# Patient Record
Sex: Male | Born: 1940 | Race: White | Hispanic: No | Marital: Married | Smoking: Never smoker
Health system: Southern US, Community
[De-identification: ages and names within clinical notes are randomized; demographics above are authoritative.]

## PROBLEM LIST (undated history)

## (undated) DIAGNOSIS — R3129 Other microscopic hematuria: Secondary | ICD-10-CM

## (undated) DIAGNOSIS — Z85828 Personal history of other malignant neoplasm of skin: Secondary | ICD-10-CM

## (undated) DIAGNOSIS — N3281 Overactive bladder: Secondary | ICD-10-CM

## (undated) DIAGNOSIS — M47816 Spondylosis without myelopathy or radiculopathy, lumbar region: Secondary | ICD-10-CM

## (undated) DIAGNOSIS — K269 Duodenal ulcer, unspecified as acute or chronic, without hemorrhage or perforation: Secondary | ICD-10-CM

## (undated) DIAGNOSIS — Z860101 Personal history of adenomatous and serrated colon polyps: Secondary | ICD-10-CM

## (undated) DIAGNOSIS — M1990 Unspecified osteoarthritis, unspecified site: Secondary | ICD-10-CM

## (undated) DIAGNOSIS — Z86010 Personal history of colonic polyps: Secondary | ICD-10-CM

## (undated) DIAGNOSIS — K760 Fatty (change of) liver, not elsewhere classified: Secondary | ICD-10-CM

## (undated) DIAGNOSIS — M19072 Primary osteoarthritis, left ankle and foot: Secondary | ICD-10-CM

## (undated) DIAGNOSIS — IMO0001 Reserved for inherently not codable concepts without codable children: Secondary | ICD-10-CM

## (undated) DIAGNOSIS — N183 Chronic kidney disease, stage 3 unspecified: Secondary | ICD-10-CM

## (undated) DIAGNOSIS — I10 Essential (primary) hypertension: Secondary | ICD-10-CM

## (undated) DIAGNOSIS — R600 Localized edema: Secondary | ICD-10-CM

## (undated) DIAGNOSIS — N189 Chronic kidney disease, unspecified: Secondary | ICD-10-CM

## (undated) DIAGNOSIS — E669 Obesity, unspecified: Secondary | ICD-10-CM

## (undated) DIAGNOSIS — R159 Full incontinence of feces: Secondary | ICD-10-CM

## (undated) DIAGNOSIS — H9190 Unspecified hearing loss, unspecified ear: Secondary | ICD-10-CM

## (undated) DIAGNOSIS — G8929 Other chronic pain: Secondary | ICD-10-CM

## (undated) DIAGNOSIS — K209 Esophagitis, unspecified without bleeding: Secondary | ICD-10-CM

## (undated) DIAGNOSIS — K922 Gastrointestinal hemorrhage, unspecified: Secondary | ICD-10-CM

## (undated) DIAGNOSIS — E785 Hyperlipidemia, unspecified: Secondary | ICD-10-CM

## (undated) DIAGNOSIS — R7303 Prediabetes: Secondary | ICD-10-CM

## (undated) DIAGNOSIS — K648 Other hemorrhoids: Secondary | ICD-10-CM

## (undated) DIAGNOSIS — K5790 Diverticulosis of intestine, part unspecified, without perforation or abscess without bleeding: Secondary | ICD-10-CM

## (undated) DIAGNOSIS — J301 Allergic rhinitis due to pollen: Secondary | ICD-10-CM

## (undated) HISTORY — DX: Gastrointestinal hemorrhage, unspecified: K92.2

## (undated) HISTORY — DX: Full incontinence of feces: R15.9

## (undated) HISTORY — DX: Other hemorrhoids: K64.8

## (undated) HISTORY — DX: Personal history of adenomatous and serrated colon polyps: Z86.0101

## (undated) HISTORY — DX: Other chronic pain: G89.29

## (undated) HISTORY — DX: Other microscopic hematuria: R31.29

## (undated) HISTORY — DX: Obesity, unspecified: E66.9

## (undated) HISTORY — DX: Duodenal ulcer, unspecified as acute or chronic, without hemorrhage or perforation: K26.9

## (undated) HISTORY — PX: TONSILLECTOMY: SUR1361

## (undated) HISTORY — DX: Overactive bladder: N32.81

## (undated) HISTORY — DX: Fatty (change of) liver, not elsewhere classified: K76.0

## (undated) HISTORY — DX: Chronic kidney disease, unspecified: N18.9

## (undated) HISTORY — PX: ELBOW SURGERY: SHX618

## (undated) HISTORY — DX: Prediabetes: R73.03

## (undated) HISTORY — PX: WISDOM TOOTH EXTRACTION: SHX21

## (undated) HISTORY — DX: Personal history of other malignant neoplasm of skin: Z85.828

## (undated) HISTORY — DX: Essential (primary) hypertension: I10

## (undated) HISTORY — DX: Diverticulosis of intestine, part unspecified, without perforation or abscess without bleeding: K57.90

## (undated) HISTORY — DX: Allergic rhinitis due to pollen: J30.1

## (undated) HISTORY — PX: COLONOSCOPY: SHX174

## (undated) HISTORY — DX: Spondylosis without myelopathy or radiculopathy, lumbar region: M47.816

## (undated) HISTORY — DX: Unspecified hearing loss, unspecified ear: H91.90

## (undated) HISTORY — DX: Chronic kidney disease, stage 3 unspecified: N18.30

## (undated) HISTORY — DX: Hyperlipidemia, unspecified: E78.5

## (undated) HISTORY — PX: POLYPECTOMY: SHX149

## (undated) HISTORY — DX: Localized edema: R60.0

## (undated) HISTORY — DX: Personal history of colonic polyps: Z86.010

## (undated) HISTORY — DX: Reserved for inherently not codable concepts without codable children: IMO0001

## (undated) HISTORY — PX: COLONOSCOPY W/ POLYPECTOMY: SHX1380

## (undated) HISTORY — DX: Unspecified osteoarthritis, unspecified site: M19.90

## (undated) HISTORY — DX: Esophagitis, unspecified without bleeding: K20.90

---

## 2009-07-10 HISTORY — PX: UMBILICAL HERNIA REPAIR: SHX196

## 2009-07-26 ENCOUNTER — Encounter: Admission: RE | Admit: 2009-07-26 | Discharge: 2009-07-26 | Payer: Self-pay | Admitting: General Surgery

## 2009-10-19 DIAGNOSIS — IMO0001 Reserved for inherently not codable concepts without codable children: Secondary | ICD-10-CM | POA: Insufficient documentation

## 2010-08-31 DIAGNOSIS — IMO0002 Reserved for concepts with insufficient information to code with codable children: Secondary | ICD-10-CM | POA: Insufficient documentation

## 2010-08-31 DIAGNOSIS — M1710 Unilateral primary osteoarthritis, unspecified knee: Secondary | ICD-10-CM | POA: Insufficient documentation

## 2010-08-31 DIAGNOSIS — B351 Tinea unguium: Secondary | ICD-10-CM | POA: Insufficient documentation

## 2010-08-31 DIAGNOSIS — E785 Hyperlipidemia, unspecified: Secondary | ICD-10-CM | POA: Insufficient documentation

## 2011-07-11 DIAGNOSIS — K5790 Diverticulosis of intestine, part unspecified, without perforation or abscess without bleeding: Secondary | ICD-10-CM

## 2011-07-11 HISTORY — DX: Diverticulosis of intestine, part unspecified, without perforation or abscess without bleeding: K57.90

## 2011-07-12 DIAGNOSIS — Z1211 Encounter for screening for malignant neoplasm of colon: Secondary | ICD-10-CM | POA: Diagnosis not present

## 2011-07-12 DIAGNOSIS — Z79899 Other long term (current) drug therapy: Secondary | ICD-10-CM | POA: Diagnosis not present

## 2011-07-12 DIAGNOSIS — K635 Polyp of colon: Secondary | ICD-10-CM | POA: Insufficient documentation

## 2011-07-12 DIAGNOSIS — I10 Essential (primary) hypertension: Secondary | ICD-10-CM | POA: Diagnosis not present

## 2011-07-12 DIAGNOSIS — C4491 Basal cell carcinoma of skin, unspecified: Secondary | ICD-10-CM | POA: Insufficient documentation

## 2011-07-12 DIAGNOSIS — Z23 Encounter for immunization: Secondary | ICD-10-CM | POA: Diagnosis not present

## 2011-07-12 DIAGNOSIS — E559 Vitamin D deficiency, unspecified: Secondary | ICD-10-CM | POA: Diagnosis not present

## 2011-07-12 DIAGNOSIS — M1710 Unilateral primary osteoarthritis, unspecified knee: Secondary | ICD-10-CM | POA: Diagnosis not present

## 2011-07-12 DIAGNOSIS — Z125 Encounter for screening for malignant neoplasm of prostate: Secondary | ICD-10-CM | POA: Diagnosis not present

## 2011-07-12 DIAGNOSIS — R7402 Elevation of levels of lactic acid dehydrogenase (LDH): Secondary | ICD-10-CM | POA: Diagnosis not present

## 2011-07-12 DIAGNOSIS — R7401 Elevation of levels of liver transaminase levels: Secondary | ICD-10-CM | POA: Diagnosis not present

## 2011-07-12 DIAGNOSIS — E785 Hyperlipidemia, unspecified: Secondary | ICD-10-CM | POA: Diagnosis not present

## 2011-07-12 DIAGNOSIS — Z0000 Encounter for general adult medical examination without abnormal findings: Secondary | ICD-10-CM | POA: Diagnosis not present

## 2011-07-14 DIAGNOSIS — Z23 Encounter for immunization: Secondary | ICD-10-CM | POA: Diagnosis not present

## 2011-08-31 DIAGNOSIS — H2510 Age-related nuclear cataract, unspecified eye: Secondary | ICD-10-CM | POA: Diagnosis not present

## 2011-09-04 DIAGNOSIS — D126 Benign neoplasm of colon, unspecified: Secondary | ICD-10-CM | POA: Diagnosis not present

## 2011-09-22 DIAGNOSIS — Z79899 Other long term (current) drug therapy: Secondary | ICD-10-CM | POA: Diagnosis not present

## 2011-09-22 DIAGNOSIS — K620 Anal polyp: Secondary | ICD-10-CM | POA: Diagnosis not present

## 2011-09-22 DIAGNOSIS — Z86010 Personal history of colonic polyps: Secondary | ICD-10-CM | POA: Diagnosis not present

## 2011-09-22 DIAGNOSIS — Z7982 Long term (current) use of aspirin: Secondary | ICD-10-CM | POA: Diagnosis not present

## 2011-09-22 DIAGNOSIS — K5730 Diverticulosis of large intestine without perforation or abscess without bleeding: Secondary | ICD-10-CM | POA: Diagnosis not present

## 2011-09-22 DIAGNOSIS — D126 Benign neoplasm of colon, unspecified: Secondary | ICD-10-CM | POA: Diagnosis not present

## 2011-09-22 DIAGNOSIS — M129 Arthropathy, unspecified: Secondary | ICD-10-CM | POA: Diagnosis not present

## 2011-09-22 DIAGNOSIS — I10 Essential (primary) hypertension: Secondary | ICD-10-CM | POA: Diagnosis not present

## 2011-09-22 LAB — HM COLONOSCOPY

## 2012-01-09 DIAGNOSIS — R7401 Elevation of levels of liver transaminase levels: Secondary | ICD-10-CM | POA: Diagnosis not present

## 2012-01-09 DIAGNOSIS — I10 Essential (primary) hypertension: Secondary | ICD-10-CM | POA: Diagnosis not present

## 2012-01-09 DIAGNOSIS — E785 Hyperlipidemia, unspecified: Secondary | ICD-10-CM | POA: Diagnosis not present

## 2012-05-10 DIAGNOSIS — K760 Fatty (change of) liver, not elsewhere classified: Secondary | ICD-10-CM

## 2012-05-10 HISTORY — DX: Fatty (change of) liver, not elsewhere classified: K76.0

## 2012-05-13 DIAGNOSIS — R7401 Elevation of levels of liver transaminase levels: Secondary | ICD-10-CM | POA: Diagnosis not present

## 2012-05-13 DIAGNOSIS — R7989 Other specified abnormal findings of blood chemistry: Secondary | ICD-10-CM | POA: Diagnosis not present

## 2012-05-13 DIAGNOSIS — Z79899 Other long term (current) drug therapy: Secondary | ICD-10-CM | POA: Diagnosis not present

## 2012-05-13 DIAGNOSIS — E785 Hyperlipidemia, unspecified: Secondary | ICD-10-CM | POA: Diagnosis not present

## 2012-05-13 DIAGNOSIS — Z23 Encounter for immunization: Secondary | ICD-10-CM | POA: Diagnosis not present

## 2012-05-13 DIAGNOSIS — I10 Essential (primary) hypertension: Secondary | ICD-10-CM | POA: Diagnosis not present

## 2012-05-14 DIAGNOSIS — R7989 Other specified abnormal findings of blood chemistry: Secondary | ICD-10-CM | POA: Diagnosis not present

## 2012-05-14 DIAGNOSIS — K7689 Other specified diseases of liver: Secondary | ICD-10-CM | POA: Diagnosis not present

## 2012-07-08 DIAGNOSIS — L821 Other seborrheic keratosis: Secondary | ICD-10-CM | POA: Diagnosis not present

## 2012-07-08 DIAGNOSIS — Z85828 Personal history of other malignant neoplasm of skin: Secondary | ICD-10-CM | POA: Diagnosis not present

## 2012-07-31 ENCOUNTER — Ambulatory Visit (INDEPENDENT_AMBULATORY_CARE_PROVIDER_SITE_OTHER): Payer: Medicare Other | Admitting: Family Medicine

## 2012-07-31 ENCOUNTER — Encounter: Payer: Self-pay | Admitting: Family Medicine

## 2012-07-31 VITALS — BP 150/89 | HR 78 | Temp 97.8°F | Ht 68.0 in | Wt 239.0 lb

## 2012-07-31 DIAGNOSIS — Z7189 Other specified counseling: Secondary | ICD-10-CM | POA: Diagnosis not present

## 2012-07-31 DIAGNOSIS — Z7689 Persons encountering health services in other specified circumstances: Secondary | ICD-10-CM

## 2012-07-31 NOTE — Progress Notes (Signed)
Office Note 08/05/2012  CC:  Chief Complaint  Patient presents with  . Establish Care    no problems    HPI:  Gregory Blevins is a 72 y.o. White male who is here to establish care. Patient's most recent primary MD: Dr. Mickey Farber at [facility]. Old records that pt brought with him were reviewed prior to or during today's visit.  Patient has no acute complaints, just wants to establish care.  His PMD is no longer with [facility] so pt decided it was time to get a PMD closer to his home  Past Medical History  Diagnosis Date  . HTN (hypertension)   . Hyperlipidemia   . Hay fever   . Obesity   . History of basal cell carcinoma of skin   . Fatty liver 05/2012    u/s done for mild/persistent elevation of LFTs  . Hx of adenomatous colonic polyps 09/2011    Recall 2016   . 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 Umbilical hernia repair 0947    Family History  Problem Relation Age of Onset  . Hypertension Father   . Colon cancer Father   . Alcoholism Father     History   Social History  . Marital Status: Married    Spouse Name: N/A    Number of Children: N/A  . Years of Education: N/A   Occupational History  . Not on file.   Social History Main Topics  . Smoking status: Never Smoker   . Smokeless tobacco: Never Used  . Alcohol Use: Yes     Comment: occassional  . Drug Use: No  . Sexually Active: Not on file   Other Topics Concern  . Not on file   Social History Narrative   Married, 2 children, 3 grandchildren.Occupation: retired Product manager specialistEducation: HSNo tob, minimal alcohol, no drugs.     Outpatient Encounter Prescriptions as of 07/31/2012  Medication Sig Dispense Refill  . aspirin EC 81 MG tablet Take 81 mg by mouth daily.      Marland Kitchen losartan (COZAAR) 50 MG tablet Take 1 tablet by mouth Daily.        No Known  Allergies  ROS Review of Systems  Constitutional: Negative for fever and fatigue.  HENT: Negative for congestion and sore throat.   Eyes: Negative for visual disturbance.  Respiratory: Negative for cough.   Cardiovascular: Negative for chest pain.  Gastrointestinal: Negative for nausea and abdominal pain.  Genitourinary: Negative for dysuria.  Musculoskeletal: Negative for back pain and joint swelling.  Skin: Negative for rash.  Neurological: Negative for weakness and headaches.  Hematological: Negative for adenopathy.    PE; Blood pressure 150/89, pulse 78, temperature 97.8 F (36.6 C), temperature source Temporal, height 5' 8"  (1.727 m), weight 239 lb (108.41 kg), SpO2 97.00%. Gen: Alert, well appearing.  Patient is oriented to person, place, time, and situation. SJG:GEZM: no injection, icteris, swelling, or exudate.  EOMI, PERRLA. Nose: no drainage or turbinate edema/swelling.  No injection or focal lesion.  Mouth: lips without lesion/swelling.  Oral mucosa pink and moist. Oropharynx without erythema, exudate, or swelling.  CV: RRR, no m/r/g.   LUNGS: CTA bilat, nonlabored resps, good aeration in all lung fields. EXT: 2+ LE pitting edema bilat  Pertinent labs:  None today  ASSESSMENT AND PLAN:   Encounter to establish care with new doctor  Reviewed history and meds.  No acute problems.  No vaccines needed today. Will obtain complete old records. Will have him return in March 2014, which is when he would be due for his annual CPE per his report today--fasting visit.    Return in about 2 months (around 09/28/2012) for morning appt for CPE--pt needs to fast.

## 2012-07-31 NOTE — Patient Instructions (Signed)
Goal blood pressure is <140 on top and < 90 on bottom.

## 2012-08-05 ENCOUNTER — Encounter: Payer: Self-pay | Admitting: Family Medicine

## 2012-08-05 NOTE — Assessment & Plan Note (Signed)
Reviewed history and meds.  No acute problems.  No vaccines needed today. Will obtain complete old records. Will have him return in March 2014, which is when he would be due for his annual CPE per his report today--fasting visit.

## 2012-08-23 ENCOUNTER — Ambulatory Visit: Payer: Self-pay | Admitting: Family Medicine

## 2012-08-23 DIAGNOSIS — R05 Cough: Secondary | ICD-10-CM | POA: Diagnosis not present

## 2012-08-23 DIAGNOSIS — J069 Acute upper respiratory infection, unspecified: Secondary | ICD-10-CM | POA: Diagnosis not present

## 2012-08-23 DIAGNOSIS — J209 Acute bronchitis, unspecified: Secondary | ICD-10-CM | POA: Diagnosis not present

## 2012-09-03 DIAGNOSIS — H2510 Age-related nuclear cataract, unspecified eye: Secondary | ICD-10-CM | POA: Diagnosis not present

## 2012-09-27 ENCOUNTER — Ambulatory Visit (INDEPENDENT_AMBULATORY_CARE_PROVIDER_SITE_OTHER): Payer: Medicare Other | Admitting: Family Medicine

## 2012-09-27 ENCOUNTER — Encounter: Payer: Self-pay | Admitting: Family Medicine

## 2012-09-27 VITALS — BP 158/82 | HR 63 | Temp 98.0°F | Resp 16 | Ht 67.0 in | Wt 234.2 lb

## 2012-09-27 DIAGNOSIS — Z0000 Encounter for general adult medical examination without abnormal findings: Secondary | ICD-10-CM

## 2012-09-27 DIAGNOSIS — Z125 Encounter for screening for malignant neoplasm of prostate: Secondary | ICD-10-CM

## 2012-09-27 DIAGNOSIS — I10 Essential (primary) hypertension: Secondary | ICD-10-CM | POA: Diagnosis not present

## 2012-09-27 LAB — COMPREHENSIVE METABOLIC PANEL
Albumin: 3.9 g/dL (ref 3.5–5.2)
Alkaline Phosphatase: 60 U/L (ref 39–117)
BUN: 23 mg/dL (ref 6–23)
Glucose, Bld: 84 mg/dL (ref 70–99)
Potassium: 4.3 mEq/L (ref 3.5–5.1)

## 2012-09-27 LAB — LIPID PANEL
Cholesterol: 178 mg/dL (ref 0–200)
HDL: 41.4 mg/dL (ref >39.00)
Triglycerides: 110 mg/dL (ref 0.0–149.0)
VLDL: 22 mg/dL (ref 0.0–40.0)

## 2012-09-27 NOTE — Patient Instructions (Addendum)
Goal BP is < 140 on top and <90 on bottom.   Check bp twice a week.  Call if either the avg top number or avg bottom number is above these cutoffs.

## 2012-09-27 NOTE — Progress Notes (Signed)
Office Note 09/29/2012  CC:  Chief Complaint  Patient presents with  . Annual Exam    HPI:  Gregory Blevins is a 72 y.o. White male who is here for CPE. Currently asymptomatic.  Had an episode of acute bronchitis about 2 wks ago, we were closed due to snowstorm--he went to [facility] on [address] and was rx'd abx and this helped a lot.  Feels back to normal now.     Past Medical History  Diagnosis Date  . HTN (hypertension)   . Hyperlipidemia   . Hay fever   . Obesity   . History of basal cell carcinoma of skin   . Fatty liver 05/2012    u/s done for mild/persistent elevation of LFTs  . Hx of adenomatous colonic polyps 09/2011    Recall 2016   . 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 Umbilical hernia repair  2011    Family History  Problem Relation Age of Onset  . Hypertension Father   . Colon cancer Father   . Alcoholism Father     History   Social History  . Marital Status: Married    Spouse Name: N/A    Number of Children: N/A  . Years of Education: N/A   Occupational History  . Not on file.   Social History Main Topics  . Smoking status: Never Smoker   . Smokeless tobacco: Never Used  . Alcohol Use: Yes     Comment: occassional  . Drug Use: No  . Sexually Active: Not on file   Other Topics Concern  . Not on file   Social History Narrative   Married, 2 children, 3 grandchildren.   Occupation: retired Financial trader   Education: HS   No tob, minimal alcohol, no drugs.     Outpatient Prescriptions Prior to Visit  Medication Sig Dispense Refill  . aspirin EC 81 MG tablet Take 81 mg by mouth daily.      Marland Kitchen losartan (COZAAR) 50 MG tablet Take 1 tablet by mouth Daily.       No facility-administered medications prior to visit.    No Known Allergies  ROS Review of Systems  Constitutional: Negative for fever, chills, appetite change  and fatigue.  HENT: Negative for ear pain, congestion, sore throat, neck stiffness and dental problem.   Eyes: Negative for discharge, redness and visual disturbance.  Respiratory: Negative for cough, chest tightness, shortness of breath and wheezing.   Cardiovascular: Negative for chest pain, palpitations and leg swelling.  Gastrointestinal: Negative for nausea, vomiting, abdominal pain, diarrhea and blood in stool.  Genitourinary: Negative for dysuria, urgency, frequency, hematuria, flank pain and difficulty urinating.  Musculoskeletal: Positive for joint swelling (question of hands diffusely swollen per wife's observation today at home--pt asymptomatic). Negative for myalgias, back pain and arthralgias.  Skin: Negative for pallor and rash.  Neurological: Negative for dizziness, speech difficulty, weakness and headaches.  Hematological: Negative for adenopathy. Does not bruise/bleed easily.  Psychiatric/Behavioral: Negative for confusion and sleep disturbance. The patient is not nervous/anxious.     PE; Blood pressure 158/82, pulse 63, temperature 98 F (36.7 C), temperature source Temporal, resp. rate 16, height 5' 7"  (1.702 m), weight 234 lb 4 oz (106.255 kg), SpO2 99.00%. Gen: Alert, well appearing, obese white male in NAD.  Patient is oriented to person, place, time, and situation. AFFECT: pleasant, lucid thought and speech. ENT:  Ears: EACs clear, normal epithelium.  TMs with good light reflex and landmarks bilaterally.  Eyes: no injection, icteris, swelling, or exudate.  EOMI, PERRLA. Nose: no drainage or turbinate edema/swelling.  No injection or focal lesion.  Mouth: lips without lesion/swelling.  Oral mucosa pink and moist.  Dentition intact and without obvious caries or gingival swelling.  Oropharynx without erythema, exudate, or swelling.  Neck: supple/nontender.  No LAD, mass, or TM.  Carotid pulses 2+ bilaterally, without bruits. CV: RRR, no m/r/g.   LUNGS: CTA bilat, nonlabored  resps, good aeration in all lung fields. ABD: soft, NT, ND, BS normal.  No hepatospenomegaly or mass.  No bruits. EXT: no clubbing or cyanosis.  1 + pitting edema in both LE's.  Hands have no edema, tenderness, or nodularity. Musculoskeletal: no joint swelling, erythema, warmth, or tenderness.  ROM of all joints intact. Skin - no sores or suspicious lesions or rashes or color changes Genitals normal; both testes normal without tenderness, masses, hydroceles, varicoceles, erythema or swelling. Shaft normal, circumcised, meatus normal without discharge. No inguinal hernia noted. No inguinal lymphadenopathy. Rectal exam: negative without mass, lesions or tenderness, PROSTATE EXAM: smooth and symmetric without nodules or tenderness.  Pertinent labs:  None today  ASSESSMENT AND PLAN:   HTN (hypertension), benign Home numbers ok. Continue to monitor--goals reviewed. Signs/symptoms to call or return for were reviewed and pt expressed understanding.   Health maintenance examination Reviewed age and gender appropriate health maintenance issues (prudent diet, regular exercise, health risks of tobacco and excessive alcohol, use of seatbelts, fire alarms in home, use of sunscreen).  Also reviewed age and gender appropriate health screening as well as vaccine recommendations. PSA screening. Vacc's all UTD.  An After Visit Summary was printed and given to the patient.  FOLLOW UP:  Return in about 6 months (around 03/30/2013) for f/u HTN.

## 2012-09-27 NOTE — Assessment & Plan Note (Signed)
Home numbers ok. Continue to monitor--goals reviewed. Signs/symptoms to call or return for were reviewed and pt expressed understanding.

## 2012-09-29 NOTE — Assessment & Plan Note (Signed)
Reviewed age and gender appropriate health maintenance issues (prudent diet, regular exercise, health risks of tobacco and excessive alcohol, use of seatbelts, fire alarms in home, use of sunscreen).  Also reviewed age and gender appropriate health screening as well as vaccine recommendations. PSA screening. Vacc's all UTD.

## 2013-03-21 ENCOUNTER — Telehealth: Payer: Self-pay | Admitting: Family Medicine

## 2013-03-21 ENCOUNTER — Ambulatory Visit (INDEPENDENT_AMBULATORY_CARE_PROVIDER_SITE_OTHER): Payer: Medicare Other | Admitting: Family Medicine

## 2013-03-21 ENCOUNTER — Encounter: Payer: Self-pay | Admitting: Family Medicine

## 2013-03-21 VITALS — BP 130/77 | HR 65 | Temp 98.8°F | Resp 18 | Ht 67.0 in | Wt 230.0 lb

## 2013-03-21 DIAGNOSIS — R109 Unspecified abdominal pain: Secondary | ICD-10-CM

## 2013-03-21 DIAGNOSIS — R3129 Other microscopic hematuria: Secondary | ICD-10-CM

## 2013-03-21 DIAGNOSIS — Z23 Encounter for immunization: Secondary | ICD-10-CM

## 2013-03-21 DIAGNOSIS — R102 Pelvic and perineal pain: Secondary | ICD-10-CM | POA: Insufficient documentation

## 2013-03-21 LAB — POCT URINALYSIS DIPSTICK
Bilirubin, UA: NEGATIVE
Ketones, UA: NEGATIVE
Leukocytes, UA: NEGATIVE

## 2013-03-21 LAB — URINALYSIS, ROUTINE W REFLEX MICROSCOPIC
Bilirubin Urine: NEGATIVE
Ketones, ur: NEGATIVE
Specific Gravity, Urine: 1.015 (ref 1.000–1.030)
Urine Glucose: NEGATIVE
pH: 6 (ref 5.0–8.0)

## 2013-03-21 NOTE — Telephone Encounter (Signed)
Saw pt in office today 03/21/13.

## 2013-03-21 NOTE — Telephone Encounter (Signed)
Patient Information:  Caller Name: Kawhi  Phone: [phone]  Patient: Gregory Blevins, Gregory Blevins  Gender: Male  DOB: 02/22/1941  Age: 72 Years  PCP: Ricardo Jericho [facility])  Office Follow Up:  Does the office need to follow up with this patient?: No  Instructions For The Office: N/A  RN Note:  Pt was riding tractor on 9[address]10 for several hours when Pt went to get off, He experienced severe middle Pelvic pain.  Pt is able to walk and sit down, but unable to bend forward or squat down. Pt denies injury or hearing or feeling any popping while coming off tractor. All emergent symptoms ruled out per Hip non-injury protocol in [facility], see in 24 hrs due to imparied functioning.  Symptoms  Reason For Call & Symptoms: Pelvic Pain  Reviewed Health History In EMR: Yes  Reviewed Medications In EMR: Yes  Reviewed Allergies In EMR: Yes  Reviewed Surgeries / Procedures: Yes  Date of Onset of Symptoms: 03/19/2013  Treatments Tried: Motrin 668m q 6 hrs since 9-10  Treatments Tried Worked: No  Guideline(s) Used:  No Protocol Available - Sick Adult  Disposition Per Guideline:   See Today or Tomorrow in Office  Reason For Disposition Reached:   Nursing judgment  Advice Given:  N/A  Patient Will Follow Care Advice:  YES  Appointment Scheduled:  03/21/2013 10:15:00 Appointment Scheduled Provider:  MRicardo Jericho(Family Practice)

## 2013-03-21 NOTE — Addendum Note (Signed)
Addended by: Ralph Dowdy on: 03/21/2013 11:04 AM   Modules accepted: Orders

## 2013-03-21 NOTE — Patient Instructions (Signed)
Take celebrex 261m cap: 1 daily for the next 7-10 days as needed.

## 2013-03-21 NOTE — Assessment & Plan Note (Addendum)
Exam reassuring.  Likely musculoskeletal/pelvic floor muscles strained from his long ride on lawn tractor just prior to onset of sx's.  With trace blood in UA I suppose this could represent mild traumatic prostatitis, but I would have expected his prostate exam to show tenderness today.  Will send urine for microscopy to r/o false positive. Reassured pt today. Recommended celebrex 293m qd x 7-10d.  Samples given today.  Therapeutic expectations and side effect profile of medication discussed today.  Patient's questions answered.

## 2013-03-21 NOTE — Progress Notes (Signed)
OFFICE NOTE  03/21/2013  CC:  Chief Complaint  Patient presents with  . Hip Pain    pelvic pain     HPI: Patient is a 71 y.o. Caucasian male who is here for pelvic pain. Onset of pain in pelvic region 2d/a after riding on the lawn tractor for a few hours.  It was a bumpy ride at times.  Says he could hardly get off the tractor when he finished.  Seems to feel a bit better today and day before. Feels a throbbing warmth/pain in pelvic/GU/low back region--seems to be center of the pelvis area.   No dysuria, no urgency, no frequency, no hematuria.  Bowel movements normal lately. Recalls no prior hx of prostate problems.  No prior hx of pelvic pain or hip arthralgias.  Pertinent PMH:  Past Medical History  Diagnosis Date  . HTN (hypertension)   . Hyperlipidemia   . Hay fever   . Obesity   . History of basal cell carcinoma of skin   . Fatty liver 05/2012    u/s done for mild/persistent elevation of LFTs  . Hx of adenomatous colonic polyps 09/2011    Recall 2016   . 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 Umbilical hernia repair  2011    MEDS:  Outpatient Prescriptions Prior to Visit  Medication Sig Dispense Refill  . aspirin EC 81 MG tablet Take 81 mg by mouth daily.      . Ergocalciferol (VITAMIN D2) 2000 UNITS TABS Take 1 capsule by mouth daily.      Marland Kitchen losartan (COZAAR) 50 MG tablet Take 1 tablet by mouth Daily.       No facility-administered medications prior to visit.    PE: Blood pressure 130/77, pulse 65, temperature 98.8 F (37.1 C), temperature source Temporal, resp. rate 18, height 5' 7"  (1.702 m), weight 230 lb (104.327 kg), SpO2 95.00%. Gen: Alert, well appearing.  Patient is oriented to person, place, time, and situation. Walks slowly, gets up and down from his chair slowly. No tenderness in back or hips.  Hips ROM bring no pain. GU: no tenderness or bulging in  pubic/suprapubic/groin region.  Testes without mass or tenderness.   DRE: rectal tone normal.  negative without mass, lesions or tenderness, PROSTATE EXAM: smooth and symmetric without nodules or tenderness, size wnl.  LABS: CC UA today showed small Hb, otherwise normal  Lab Results  Component Value Date   PSA 0.88 09/27/2012    IMPRESSION AND PLAN:  Pelvic pain in male Exam reassuring.  Likely musculoskeletal/pelvic floor muscles strained from his long ride on lawn tractor just prior to onset of sx's.  With trace blood in UA I suppose this could represent mild traumatic prostatitis, but I would have expected his prostate exam to show tenderness today.  Will send urine for microscopy to r/o false positive. Reassured pt today. Recommended celebrex 264m qd x 7-10d.  Samples given today.  Therapeutic expectations and side effect profile of medication discussed today.  Patient's questions answered.   Flu vaccine IM today.  An After Visit Summary was printed and given to the patient.  FOLLOW UEX:NTZGappt already set for CPE next week

## 2013-03-28 ENCOUNTER — Ambulatory Visit (INDEPENDENT_AMBULATORY_CARE_PROVIDER_SITE_OTHER): Payer: Medicare Other | Admitting: Family Medicine

## 2013-03-28 ENCOUNTER — Encounter: Payer: Self-pay | Admitting: Family Medicine

## 2013-03-28 VITALS — BP 131/78 | HR 53 | Temp 98.2°F | Resp 16 | Ht 67.0 in | Wt 231.0 lb

## 2013-03-28 DIAGNOSIS — R109 Unspecified abdominal pain: Secondary | ICD-10-CM | POA: Diagnosis not present

## 2013-03-28 DIAGNOSIS — R102 Pelvic and perineal pain: Secondary | ICD-10-CM

## 2013-03-28 DIAGNOSIS — I10 Essential (primary) hypertension: Secondary | ICD-10-CM | POA: Diagnosis not present

## 2013-03-28 LAB — COMPREHENSIVE METABOLIC PANEL
Albumin: 4 g/dL (ref 3.5–5.2)
Alkaline Phosphatase: 51 U/L (ref 39–117)
BUN: 25 mg/dL — ABNORMAL HIGH (ref 6–23)
CO2: 25 mEq/L (ref 19–32)
GFR: 81.85 mL/min (ref >60.00)
Glucose, Bld: 86 mg/dL (ref 70–99)
Total Bilirubin: 0.8 mg/dL (ref 0.3–1.2)

## 2013-03-28 MED ORDER — LOSARTAN POTASSIUM 50 MG PO TABS: 50.0000 mg | ORAL_TABLET | Freq: Every day | ORAL | Status: DC

## 2013-03-28 NOTE — Assessment & Plan Note (Signed)
Problem stable.  Continue current medications and diet appropriate for this condition.  We have reviewed our general long term plan for this problem and also reviewed symptoms and signs that should prompt the patient to call or return to the office. CMET today to f/u cr/lytes and AST/ALT (hx of transaminitis/fatty liver).

## 2013-03-28 NOTE — Assessment & Plan Note (Signed)
Resolving.  Has a bit of referred pain in upper left thigh--gradually improving. This was all brought on by a long/rough ride on his lawn tractor.

## 2013-03-28 NOTE — Progress Notes (Signed)
OFFICE NOTE  03/28/2013  CC:  Chief Complaint  Patient presents with  . Follow-up    6 month      HPI: Patient is a 72 y.o. Caucasian male who is here for 6 mo f/u HTN.   Pelvic region and hips better since I last saw him, just some mild pain in left hip/upper leg region.  Compliant with bp meds.  No home bp numbers for me today. No CP, no SOB, no HAs, no dizziness.  Pertinent PMH:  Past Medical History  Diagnosis Date  . HTN (hypertension)   . Hyperlipidemia   . Hay fever   . Obesity   . History of basal cell carcinoma of skin   . Fatty liver 05/2012    u/s done for mild/persistent elevation of LFTs  . Hx of adenomatous colonic polyps 09/2011    Recall 2016   . 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 Umbilical hernia repair  2011   History   Social History Narrative   Married, 2 children, 3 grandchildren.   Occupation: retired Financial trader   Education: HS   No tob, minimal alcohol, no drugs.     MEDS:  Outpatient Prescriptions Prior to Visit  Medication Sig Dispense Refill  . aspirin EC 81 MG tablet Take 81 mg by mouth daily.      . Ergocalciferol (VITAMIN D2) 2000 UNITS TABS Take 1 capsule by mouth daily.      Marland Kitchen losartan (COZAAR) 50 MG tablet Take 1 tablet by mouth Daily.       No facility-administered medications prior to visit.    PE: Blood pressure 131/78, pulse 53, temperature 98.2 F (36.8 C), temperature source Temporal, resp. rate 16, height 5' 7"  (1.702 m), weight 231 lb (104.781 kg), SpO2 97.00%. Gen: Alert, well appearing.  Patient is oriented to person, place, time, and situation. No LB TTP.  No pain with left hip ROM.  NO tenderness to palpation of left hip.  IMPRESSION AND PLAN:  HTN (hypertension), benign Problem stable.  Continue current medications and diet appropriate for this condition.  We have reviewed our general long  term plan for this problem and also reviewed symptoms and signs that should prompt the patient to call or return to the office. CMET today to f/u cr/lytes and AST/ALT (hx of transaminitis/fatty liver).  Pelvic pain in male Resolving.  Has a bit of referred pain in upper left thigh--gradually improving. This was all brought on by a long/rough ride on his lawn tractor.   An After Visit Summary was printed and given to the patient.  FOLLOW UP: 96mofor fasting CPE

## 2013-04-09 DIAGNOSIS — M47816 Spondylosis without myelopathy or radiculopathy, lumbar region: Secondary | ICD-10-CM

## 2013-04-09 HISTORY — DX: Spondylosis without myelopathy or radiculopathy, lumbar region: M47.816

## 2013-04-14 ENCOUNTER — Encounter (HOSPITAL_BASED_OUTPATIENT_CLINIC_OR_DEPARTMENT_OTHER): Payer: Self-pay

## 2013-04-14 ENCOUNTER — Emergency Department (HOSPITAL_BASED_OUTPATIENT_CLINIC_OR_DEPARTMENT_OTHER): Payer: Medicare Other

## 2013-04-14 ENCOUNTER — Emergency Department (HOSPITAL_BASED_OUTPATIENT_CLINIC_OR_DEPARTMENT_OTHER)
Admission: EM | Admit: 2013-04-14 | Discharge: 2013-04-14 | Disposition: A | Discharge status: Home / Self Care | Payer: Medicare Other | Attending: Emergency Medicine | Admitting: Emergency Medicine

## 2013-04-14 ENCOUNTER — Telehealth: Payer: Self-pay | Admitting: Family Medicine

## 2013-04-14 DIAGNOSIS — Z8719 Personal history of other diseases of the digestive system: Secondary | ICD-10-CM | POA: Insufficient documentation

## 2013-04-14 DIAGNOSIS — Z7982 Long term (current) use of aspirin: Secondary | ICD-10-CM | POA: Insufficient documentation

## 2013-04-14 DIAGNOSIS — Z85828 Personal history of other malignant neoplasm of skin: Secondary | ICD-10-CM | POA: Diagnosis not present

## 2013-04-14 DIAGNOSIS — Z79899 Other long term (current) drug therapy: Secondary | ICD-10-CM | POA: Diagnosis not present

## 2013-04-14 DIAGNOSIS — Z860100 Personal history of colon polyps, unspecified: Secondary | ICD-10-CM | POA: Insufficient documentation

## 2013-04-14 DIAGNOSIS — R6889 Other general symptoms and signs: Secondary | ICD-10-CM | POA: Diagnosis not present

## 2013-04-14 DIAGNOSIS — M25559 Pain in unspecified hip: Secondary | ICD-10-CM | POA: Diagnosis not present

## 2013-04-14 DIAGNOSIS — Z8639 Personal history of other endocrine, nutritional and metabolic disease: Secondary | ICD-10-CM | POA: Insufficient documentation

## 2013-04-14 DIAGNOSIS — M5137 Other intervertebral disc degeneration, lumbosacral region: Secondary | ICD-10-CM | POA: Diagnosis not present

## 2013-04-14 DIAGNOSIS — I10 Essential (primary) hypertension: Secondary | ICD-10-CM | POA: Diagnosis not present

## 2013-04-14 DIAGNOSIS — E669 Obesity, unspecified: Secondary | ICD-10-CM | POA: Insufficient documentation

## 2013-04-14 DIAGNOSIS — M5430 Sciatica, unspecified side: Secondary | ICD-10-CM | POA: Diagnosis not present

## 2013-04-14 DIAGNOSIS — M169 Osteoarthritis of hip, unspecified: Secondary | ICD-10-CM | POA: Diagnosis not present

## 2013-04-14 DIAGNOSIS — M1990 Unspecified osteoarthritis, unspecified site: Secondary | ICD-10-CM | POA: Insufficient documentation

## 2013-04-14 DIAGNOSIS — M5432 Sciatica, left side: Secondary | ICD-10-CM

## 2013-04-14 DIAGNOSIS — Z862 Personal history of diseases of the blood and blood-forming organs and certain disorders involving the immune mechanism: Secondary | ICD-10-CM | POA: Insufficient documentation

## 2013-04-14 LAB — BASIC METABOLIC PANEL WITH GFR
BUN: 23 mg/dL (ref 6–23)
CO2: 27 meq/L (ref 19–32)
Calcium: 9.7 mg/dL (ref 8.4–10.5)
Chloride: 100 meq/L (ref 96–112)
Creatinine, Ser: 1 mg/dL (ref 0.50–1.35)
GFR calc Af Amer: 85 mL/min — ABNORMAL LOW
GFR calc non Af Amer: 74 mL/min — ABNORMAL LOW
Glucose, Bld: 84 mg/dL (ref 70–99)
Potassium: 4 meq/L (ref 3.5–5.1)
Sodium: 137 meq/L (ref 135–145)

## 2013-04-14 LAB — CBC
MCH: 31.5 pg (ref 26.0–34.0)
MCHC: 34.6 g/dL (ref 30.0–36.0)
MCV: 90.9 fL (ref 78.0–100.0)
Platelets: 161 10*3/uL (ref 150–400)
RBC: 5.15 MIL/uL (ref 4.22–5.81)
RDW: 12.9 % (ref 11.5–15.5)

## 2013-04-14 LAB — URINALYSIS, ROUTINE W REFLEX MICROSCOPIC
Bilirubin Urine: NEGATIVE
Ketones, ur: NEGATIVE mg/dL
Protein, ur: NEGATIVE mg/dL
Urobilinogen, UA: 0.2 mg/dL (ref 0.0–1.0)
pH: 6 (ref 5.0–8.0)

## 2013-04-14 LAB — URINE MICROSCOPIC-ADD ON

## 2013-04-14 MED ORDER — PREDNISONE 20 MG PO TABS: 60.0000 mg | ORAL_TABLET | Freq: Every day | ORAL | Status: DC

## 2013-04-14 MED ORDER — HYDROCODONE-ACETAMINOPHEN 5-325 MG PO TABS: 2.0000 | ORAL_TABLET | Freq: Four times a day (QID) | ORAL | Status: DC | PRN

## 2013-04-14 NOTE — ED Notes (Signed)
MD at bedside. 

## 2013-04-14 NOTE — ED Provider Notes (Signed)
CSN: [id number]     Arrival date & time 04/14/13  1732 History  This chart was scribed for Gregory B. Karle Starch, MD by Frederich Balding, ED Scribe. This patient was seen in room MH11/MH11 and the patient's care was started at 5:37 PM.   Chief Complaint  Patient presents with  . Hip Pain   The history is provided by the patient. No language interpreter was used.   HPI Comments: Gregory Blevins is a 72 y.o. male who presents to the Emergency Department complaining of gradual onset, constant sharp left hip pain that started last night. Pt states it started worsening earlier today. He was recently seen for the same by his PCP after riding his lawn tractor for an extended period 2 weeks ago although at that time it was more of a diffuse pelvic pain. Moving from sitting to standing worsens the pain but it is resolved by rest.  He denies any new injury. Pt states he was able to ambulate after the incident. He denies fever, nausea, emesis, weakness, numbness, back pain, difficulty urinating and constipation. Pt denies being on steroids recently.   Past Medical History  Diagnosis Date  . HTN (hypertension)   . Hyperlipidemia   . Hay fever   . Obesity   . History of basal cell carcinoma of skin   . Fatty liver 05/2012    u/s done for mild/persistent elevation of LFTs  . Hx of adenomatous colonic polyps 09/2011    Recall 2016   . 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 Umbilical hernia repair  2011   Family History  Problem Relation Age of Onset  . Hypertension Father   . Colon cancer Father   . Alcoholism Father    History  Substance Use Topics  . Smoking status: Never Smoker   . Smokeless tobacco: Never Used  . Alcohol Use: Yes     Comment: occassional    Review of Systems A complete 10 system review of systems was obtained and all systems are negative except as noted in the HPI and PMH.    Allergies  Review of patient's allergies indicates no known allergies.  Home Medications   Current Outpatient Rx  Name  Route  Sig  Dispense  Refill  . aspirin EC 81 MG tablet   Oral   Take 81 mg by mouth daily.         . Ergocalciferol (VITAMIN D2) 2000 UNITS TABS   Oral   Take 1 capsule by mouth daily.         Marland Kitchen losartan (COZAAR) 50 MG tablet   Oral   Take 1 tablet (50 mg total) by mouth daily.   90 tablet   1    BP 175/77  Pulse 79  Temp(Src) 97.4 F (36.3 C) (Oral)  Resp 20  SpO2 100%  Physical Exam  Nursing note and vitals reviewed. Constitutional: He is oriented to person, place, and time. He appears well-developed and well-nourished.  HENT:  Head: Normocephalic and atraumatic.  Eyes: EOM are normal. Pupils are equal, round, and reactive to light.  Neck: Normal range of motion. Neck supple.  Cardiovascular: Normal rate, normal heart sounds and intact distal pulses.   Pulmonary/Chest: Effort normal and breath sounds normal.  Abdominal: Bowel sounds are normal. He exhibits no distension. There is no tenderness.  Musculoskeletal: Normal range of motion. He exhibits no edema  and no tenderness.  Neurological: He is alert and oriented to person, place, and time. He has normal strength. No cranial nerve deficit or sensory deficit.  Skin: Skin is warm and dry. No rash noted.  Psychiatric: He has a normal mood and affect.    ED Course  Procedures (including critical care time)  DIAGNOSTIC STUDIES: Oxygen Saturation is 100% on RA, normal by my interpretation.    COORDINATION OF CARE: 5:44 PM-Discussed treatment plan which includes labs and xray with pt at bedside and pt agreed to plan.   Labs Review Labs Reviewed  BASIC METABOLIC PANEL - Abnormal; Notable for the following:    GFR calc non Af Amer 74 (*)    GFR calc Af Amer 85 (*)    All other components within normal limits  URINALYSIS, ROUTINE W REFLEX MICROSCOPIC - Abnormal; Notable for the  following:    Hgb urine dipstick SMALL (*)    All other components within normal limits  CBC  URINE MICROSCOPIC-ADD ON   Imaging Review Dg Lumbar Spine Complete  04/14/2013   *RADIOLOGY REPORT*  Clinical Data: Low back pain radiating in the left hip x2 weeks  LUMBAR SPINE - COMPLETE 4+ VIEW  Comparison: Concurrently obtained radiographs of the pelvis left hip  Findings: Frontal, lateral and bilateral oblique radiographs of the lumbar spine demonstrate no acute fracture or malalignment.  There is mild degenerative disc disease with grade 1 anterolisthesis of L4 and L5.  Extensive facet arthropathy is noted throughout the lumbar spine most significant at L3-L4, L4-L5 and L5-S1.  Bony mineralization is within normal limits.  Visualized bowel gas pattern is unremarkable.  IMPRESSION:  1.  No acute fracture or malalignment. 2.  Extensive lower lumbar facet arthropathy. 3.  Mild grade 1 anterolisthesis of L4 on L5. 4.  Mild multilevel degenerative disc disease.   Original Report Authenticated By: Jacqulynn Cadet, M.D.   Dg Hip Complete Left  04/14/2013   *RADIOLOGY REPORT*  Clinical Data: Lower back pain radiating in the hip for 2 weeks  LEFT HIP - COMPLETE 2+ VIEW  Comparison: Concurrently obtained radiographs of the lumbar spine  Findings: Frontal view of the pelvis with additional views of the left hip demonstrate no acute fracture or malalignment.  Normal bony mineralization.  No lytic or blastic osseous lesion.  Mild bilateral hip osteoarthritis slightly worse on the left than the right.  No acute soft tissue abnormality.  IMPRESSION:  1.  No acute fracture or malalignment. 2.  Mild left hip osteoarthritis.   Original Report Authenticated By: Jacqulynn Cadet, M.D.    MDM   1. Sciatica, left     Pt with L hip and leg pain, not reproducible with palpation of the hip or sciatic notch and not with ROM of the hip or knee. Xrays and labs unremarkable. Pt is able to stand and walk with minimal  discomfort. His pain is most severe when sitting for a prolonged period (like today on the commode) or when moving from standing to sitting and vice versa. I suspect this is sciatica, will treat with Prednisone, Norco if needed and PCP followup if symptoms persist. No concern for occult fracture.      I personally performed the services described in this documentation, which was scribed in my presence. The recorded information has been reviewed and is accurate.     Gregory B. Karle Starch, MD 04/14/13 1945

## 2013-04-14 NOTE — ED Notes (Signed)
Left hip pain with inability to ambulate that started PTA.  States he was recently seen by PMD for same after "sliding off a tractor"

## 2013-04-14 NOTE — ED Notes (Signed)
MD at bedside giving test results and plan of care for DC.

## 2013-04-14 NOTE — ED Notes (Signed)
Patient transported to X-ray 

## 2013-04-14 NOTE — Telephone Encounter (Signed)
Patient Information:  Caller Name: Gregory Blevins  Phone: [phone]  Patient: Gregory Blevins, Gregory Blevins  Gender: Male  DOB: July 28, 1940  Age: 72 Years  PCP: Gregory Blevins [facility])  Office Follow Up:  Does the office need to follow up with this patient?: No  Instructions For The Office: N/A  RN Note:  Unable to get up to go to [facility] so will call 911.  Symptoms  Reason For Call & Symptoms: Severe left hip pain for last 20 minutes. Seen 03/28/13 and given sample of Celebrex to use with some improvement.  Left hip pain became severe when got up from toilet. Currently sitting in hard wood chair. Unable to get up from chair due to severe pain.  Reviewed Health History In EMR: Yes  Reviewed Medications In EMR: Yes  Reviewed Allergies In EMR: Yes  Reviewed Surgeries / Procedures: Yes  Date of Onset of Symptoms: 04/14/2013  Treatments Tried: Celebrex  Treatments Tried Worked: Yes  Guideline(s) Used:  Leg Pain  Disposition Per Guideline:   Go to ED Now  Reason For Disposition Reached:   Unable to walk  Advice Given:  N/A  RN Overrode Recommendation:  Document Patient  Call 911 now due to severe pain; unable to stand.

## 2013-04-22 ENCOUNTER — Encounter: Payer: Self-pay | Admitting: Nurse Practitioner

## 2013-04-22 ENCOUNTER — Ambulatory Visit (INDEPENDENT_AMBULATORY_CARE_PROVIDER_SITE_OTHER): Payer: Medicare Other | Admitting: Nurse Practitioner

## 2013-04-22 VITALS — BP 130/70 | HR 78 | Temp 98.1°F | Ht 67.0 in | Wt 230.0 lb

## 2013-04-22 DIAGNOSIS — M47817 Spondylosis without myelopathy or radiculopathy, lumbosacral region: Secondary | ICD-10-CM | POA: Diagnosis not present

## 2013-04-22 DIAGNOSIS — M169 Osteoarthritis of hip, unspecified: Secondary | ICD-10-CM | POA: Insufficient documentation

## 2013-04-22 DIAGNOSIS — M47816 Spondylosis without myelopathy or radiculopathy, lumbar region: Secondary | ICD-10-CM | POA: Insufficient documentation

## 2013-04-22 NOTE — Patient Instructions (Signed)
I think your pain is coming from arthritis in your hip. Your xrays show mild arthritis, so I think anti-inflammatories, stretching & strengthening exercises, and minimizing prolonged sitting will improve your pain. When sitting on tractor: continue to use pillow to minimize jarring and extend L leg so that hip is open greater than 90 degrees. You may use 400 mg ibuprophen twice daily to decrease inflammation, which will help with pain. Physical therapy will help to teach stretches & strengthening exercises. If these conservative efforts do not give you relief, you may need to see an orthopedist. Pleasure to meet you.  Degenerative Arthritis You have osteoarthritis. This is the wear and tear arthritis that comes with aging. It is also called degenerative arthritis. This is common in people past middle age. It is caused by stress on the joints. The large weight bearing joints of the lower extremities are most often affected. The knees, hips, back, neck, and hands can become painful, swollen, and stiff. This is the most common type of arthritis. It comes on with age, carrying too much weight, or from an injury. Treatment includes resting the sore joint until the pain and swelling improve. Crutches or a walker may be needed for severe flares. Only take over-the-counter or prescription medicines for pain, discomfort, or fever as directed by your caregiver. Local heat therapy may improve motion. Cortisone shots into the joint are sometimes used to reduce pain and swelling during flares. Osteoarthritis is usually not crippling and progresses slowly. There are things you can do to decrease pain:  Avoid high impact activities.  Exercise regularly.  Low impact exercises such as walking, biking and swimming help to keep the muscles strong and keep normal joint function.  Stretching helps to keep your range of motion.  Lose weight if you are overweight. This reduces joint stress. In severe cases when you have  pain at rest or increasing disability, joint surgery may be helpful. See your caregiver for follow-up treatment as recommended.  SEEK IMMEDIATE MEDICAL CARE IF:   You have severe joint pain.  Marked swelling and redness in your joint develops.  You develop a high fever. Document Released: 06/26/2005 Document Revised: 09/18/2011 Document Reviewed: 11/26/2006 [facility] Patient Information 2014 [place], [place].

## 2013-04-22 NOTE — Progress Notes (Signed)
Subjective:    Gregory Blevins is a 72 y.o. male who presents with left hip pain. Onset of the symptoms was several weeks ago. Inciting event: prolonged sitting & jarring on tractor. The patient reports the hip pain is worse with sitting & adduction. Patient has had no prior hip problems. Previous visits for this problem: seen in ED 04/14/13. Evaluation to date: plain films, which were abnormal  mild osteoarthritis L hip & lumbar films-advanced facet joint disease & DDD. Treatment to date: prescription analgesics, which have been mostly make pt drowsy.  The following portions of the patient's history were reviewed and updated as appropriate: allergies, current medications, past family history, past medical history, past social history, past surgical history and problem list.   Review of Systems Constitutional: negative Musculoskeletal:positive for L hip pain, negative for back pain and muscle weakness Neurological: negative for coordination problems, paresthesia, tremors and weakness   Objective:    BP 130/70  Pulse 78  Temp(Src) 98.1 F (36.7 C) (Oral)  Ht 5' 7"  (1.702 m)  Wt 230 lb (104.327 kg)  BMI 36.01 kg/m2  SpO2 96% Right hip:  FROM, painless  Left hip: pain w/int rotation & adduction, FROM  BAck: no spinal tenderness, no tenderness over sacro-iliac joints  Imaging: reviewed xrays performed in ED  Assessment:    L hip OA     Plan:    NSAIDs per medication orders. PT referral. will consider ortho ref if no improvement.

## 2013-04-27 ENCOUNTER — Encounter: Payer: Self-pay | Admitting: Family Medicine

## 2013-04-30 DIAGNOSIS — M1610 Unilateral primary osteoarthritis, unspecified hip: Secondary | ICD-10-CM | POA: Diagnosis not present

## 2013-05-02 DIAGNOSIS — M1610 Unilateral primary osteoarthritis, unspecified hip: Secondary | ICD-10-CM | POA: Diagnosis not present

## 2013-05-05 DIAGNOSIS — M1610 Unilateral primary osteoarthritis, unspecified hip: Secondary | ICD-10-CM | POA: Diagnosis not present

## 2013-05-08 DIAGNOSIS — M1610 Unilateral primary osteoarthritis, unspecified hip: Secondary | ICD-10-CM | POA: Diagnosis not present

## 2013-05-12 DIAGNOSIS — M1610 Unilateral primary osteoarthritis, unspecified hip: Secondary | ICD-10-CM | POA: Diagnosis not present

## 2013-05-16 DIAGNOSIS — M1610 Unilateral primary osteoarthritis, unspecified hip: Secondary | ICD-10-CM | POA: Diagnosis not present

## 2013-05-19 DIAGNOSIS — M1610 Unilateral primary osteoarthritis, unspecified hip: Secondary | ICD-10-CM | POA: Diagnosis not present

## 2013-07-17 DIAGNOSIS — Z85828 Personal history of other malignant neoplasm of skin: Secondary | ICD-10-CM | POA: Diagnosis not present

## 2013-07-17 DIAGNOSIS — L821 Other seborrheic keratosis: Secondary | ICD-10-CM | POA: Diagnosis not present

## 2013-07-17 DIAGNOSIS — D235 Other benign neoplasm of skin of trunk: Secondary | ICD-10-CM | POA: Diagnosis not present

## 2013-07-17 DIAGNOSIS — L570 Actinic keratosis: Secondary | ICD-10-CM | POA: Diagnosis not present

## 2013-07-17 DIAGNOSIS — D485 Neoplasm of uncertain behavior of skin: Secondary | ICD-10-CM | POA: Diagnosis not present

## 2013-07-17 DIAGNOSIS — L723 Sebaceous cyst: Secondary | ICD-10-CM | POA: Diagnosis not present

## 2013-09-16 DIAGNOSIS — H409 Unspecified glaucoma: Secondary | ICD-10-CM | POA: Diagnosis not present

## 2013-09-16 DIAGNOSIS — H35039 Hypertensive retinopathy, unspecified eye: Secondary | ICD-10-CM | POA: Diagnosis not present

## 2013-09-25 ENCOUNTER — Encounter: Payer: Medicare Other | Admitting: Family Medicine

## 2013-09-29 ENCOUNTER — Encounter: Payer: Self-pay | Admitting: Family Medicine

## 2013-09-29 ENCOUNTER — Ambulatory Visit (INDEPENDENT_AMBULATORY_CARE_PROVIDER_SITE_OTHER): Payer: Medicare Other | Admitting: Family Medicine

## 2013-09-29 VITALS — BP 144/84 | HR 60 | Temp 98.1°F | Resp 18 | Ht 67.0 in | Wt 232.0 lb

## 2013-09-29 DIAGNOSIS — Z0389 Encounter for observation for other suspected diseases and conditions ruled out: Secondary | ICD-10-CM

## 2013-09-29 DIAGNOSIS — E669 Obesity, unspecified: Secondary | ICD-10-CM | POA: Diagnosis not present

## 2013-09-29 DIAGNOSIS — R109 Unspecified abdominal pain: Secondary | ICD-10-CM

## 2013-09-29 DIAGNOSIS — E785 Hyperlipidemia, unspecified: Secondary | ICD-10-CM | POA: Diagnosis not present

## 2013-09-29 DIAGNOSIS — I10 Essential (primary) hypertension: Secondary | ICD-10-CM

## 2013-09-29 DIAGNOSIS — M169 Osteoarthritis of hip, unspecified: Secondary | ICD-10-CM

## 2013-09-29 DIAGNOSIS — Z125 Encounter for screening for malignant neoplasm of prostate: Secondary | ICD-10-CM

## 2013-09-29 DIAGNOSIS — R102 Pelvic and perineal pain: Secondary | ICD-10-CM

## 2013-09-29 DIAGNOSIS — M1610 Unilateral primary osteoarthritis, unspecified hip: Secondary | ICD-10-CM

## 2013-09-29 LAB — COMPREHENSIVE METABOLIC PANEL
ALT: 36 U/L (ref 0–53)
AST: 26 U/L (ref 0–37)
Albumin: 4.4 g/dL (ref 3.5–5.2)
Alkaline Phosphatase: 57 U/L (ref 39–117)
BUN: 21 mg/dL (ref 6–23)
CALCIUM: 9.4 mg/dL (ref 8.4–10.5)
CHLORIDE: 102 meq/L (ref 96–112)
CO2: 27 mEq/L (ref 19–32)
CREATININE: 1.1 mg/dL (ref 0.4–1.5)
GFR: 72.11 mL/min (ref >60.00)
Glucose, Bld: 89 mg/dL (ref 70–99)
POTASSIUM: 4.4 meq/L (ref 3.5–5.1)
SODIUM: 137 meq/L (ref 135–145)
TOTAL PROTEIN: 7.3 g/dL (ref 6.0–8.3)
Total Bilirubin: 0.7 mg/dL (ref 0.3–1.2)

## 2013-09-29 LAB — LIPID PANEL
CHOL/HDL RATIO: 5
CHOLESTEROL: 227 mg/dL — AB (ref 0–200)
HDL: 42.8 mg/dL (ref >39.00)
LDL CALC: 140 mg/dL — AB (ref 0–99)
TRIGLYCERIDES: 221 mg/dL — AB (ref 0.0–149.0)
VLDL: 44.2 mg/dL — ABNORMAL HIGH (ref 0.0–40.0)

## 2013-09-29 LAB — TSH: TSH: 1 u[IU]/mL (ref 0.35–5.50)

## 2013-09-29 LAB — PSA, MEDICARE: PSA: 0.84 ng/mL (ref 0.10–4.00)

## 2013-09-29 MED ORDER — LOSARTAN POTASSIUM 50 MG PO TABS: 50.0000 mg | ORAL_TABLET | Freq: Every day | ORAL | Status: DC

## 2013-09-29 NOTE — Progress Notes (Signed)
OFFICE NOTE  09/29/2013  CC:  Chief Complaint  Patient presents with  . Annual Exam     HPI: Patient is a 73 y.o. Caucasian male who is here for chronic illness/med f/u +CPE. Denies acute complaints.  No home bp measurements. PT for left hip helped a lot per pt.   Pertinent PMH:  Past Medical History  Diagnosis Date  . HTN (hypertension)   . Hyperlipidemia   . Hay fever   . Obesity   . History of basal cell carcinoma of skin   . Fatty liver 05/2012    u/s done for mild/persistent elevation of LFTs  . Hx of adenomatous colonic polyps 09/2011    Recall 2016   . Osteoarthritis     Hips (L>R on x-ray 04/2013), shoulders, and knees (hx of adhesive capsulitis of shoulder)  . Lumbar spondylosis 04/2013    with grade I spondylolisthesis L4 on L5.   Past Surgical History  Procedure Laterality Date  . Umbilical hernia repair  2011   History   Social History Narrative   Married, 2 children, 3 grandchildren.   Occupation: retired [employer] trader   Education: HS   No tob, minimal alcohol, no drugs.    Family History  Problem Relation Age of Onset  . Hypertension Father   . Colon cancer Father   . Alcoholism Father     MEDS: Not on vicodin as listed below. Outpatient Prescriptions Prior to Visit  Medication Sig Dispense Refill  . aspirin EC 81 MG tablet Take 81 mg by mouth daily.      . Ergocalciferol (VITAMIN D2) 2000 UNITS TABS Take 1 capsule by mouth daily.      Marland Kitchen losartan (COZAAR) 50 MG tablet Take 1 tablet (50 mg total) by mouth daily.  90 tablet  1  . HYDROcodone-acetaminophen (NORCO/VICODIN) 5-325 MG per tablet Take 2 tablets by mouth every 6 (six) hours as needed for pain.  30 tablet  0   No facility-administered medications prior to visit.   Review of Systems  Constitutional: Negative for fever, chills, appetite change and fatigue.  HENT: Negative for congestion, dental problem, ear pain and sore throat.   Eyes: Negative for discharge,  redness and visual disturbance.  Respiratory: Negative for cough, chest tightness, shortness of breath and wheezing.   Cardiovascular: Negative for chest pain, palpitations and leg swelling.  Gastrointestinal: Negative for nausea, vomiting, abdominal pain, diarrhea and blood in stool.  Genitourinary: Negative for dysuria, urgency, frequency, hematuria, flank pain and difficulty urinating.  Musculoskeletal: Negative for arthralgias, back pain, joint swelling, myalgias and neck stiffness.  Skin: Negative for pallor and rash.  Neurological: Negative for dizziness, speech difficulty, weakness and headaches.  Hematological: Negative for adenopathy. Does not bruise/bleed easily.  Psychiatric/Behavioral: Negative for confusion and sleep disturbance. The patient is not nervous/anxious.     PE: Blood pressure 144/84, pulse 60, temperature 98.1 F (36.7 C), temperature source Temporal, resp. rate 18, height 5' 7"  (1.702 m), weight 232 lb (105.235 kg), SpO2 100.00%. Gen: Alert, well appearing, obese-appearing.  Patient is oriented to person, place, time, and situation. AFFECT: pleasant, lucid thought and speech. ENT: Ears: EACs clear, normal epithelium.  TMs with good light reflex and landmarks bilaterally.  Eyes: no injection, icteris, swelling, or exudate.  EOMI, PERRLA. Nose: no drainage or turbinate edema/swelling.  No injection or focal lesion.  Mouth: lips without lesion/swelling.  Oral mucosa pink and moist.  Dentition intact and without obvious caries or gingival swelling.  Oropharynx without erythema,  exudate, or swelling.  Neck: supple/nontender.  No LAD, mass, or TM.  Carotid pulses trace+ bilaterally due to redundant soft tissues in neck, without bruits. CV: RRR, no m/r/g.   LUNGS: CTA bilat, nonlabored resps, good aeration in all lung fields. ABD: soft, NT, ND but rotund, BS normal.  No hepatospenomegaly or mass.  No bruits.  Diastasis recti noted. EXT: no clubbing, cyanosis, or edema.   Musculoskeletal: no joint swelling, erythema, warmth, or tenderness.  ROM of all joints intact. Skin - no sores or suspicious lesions or rashes or color changes Rectal exam: negative without mass, lesions or tenderness, PROSTATE EXAM: smooth and symmetric without nodules or tenderness.  LAB: none today  IMPRESSION AND PLAN:  1) HTN; The current medical regimen is effective;  continue present plan and medications. Check lytes/cr today.  2) Hx of hyperlip/fatty liver.  No cholest meds currently. Check FLP/transaminases today.  3) Left hip/pelvic pain: MUCH improved s/p PT.  4) Obesity: he is pretty sedentary, encouraged to do all he can for activity.  Prudent diet encouraged.  5) Prostate cancer screening: DRE normal today.  PSA drawn today.  An After Visit Summary was printed and given to the patient.  FOLLOW UP: 73mo

## 2013-09-29 NOTE — Progress Notes (Signed)
Pre visit review using our clinic review tool, if applicable. No additional management support is needed unless otherwise documented below in the visit note. 

## 2013-09-30 ENCOUNTER — Telehealth: Payer: Self-pay | Admitting: Family Medicine

## 2013-09-30 NOTE — Telephone Encounter (Signed)
Relevant patient education assigned to patient using Emmi. ° °

## 2013-10-06 MED ORDER — ATORVASTATIN CALCIUM 20 MG PO TABS: 20.0000 mg | ORAL_TABLET | Freq: Every day | ORAL | Status: DC

## 2013-10-06 NOTE — Addendum Note (Signed)
Addended by: Jannette Spanner on: 10/06/2013 03:54 PM   Modules accepted: Orders

## 2013-12-18 ENCOUNTER — Other Ambulatory Visit: Payer: Self-pay | Admitting: Family Medicine

## 2013-12-22 ENCOUNTER — Other Ambulatory Visit: Payer: Self-pay | Admitting: Family Medicine

## 2013-12-22 MED ORDER — ATORVASTATIN CALCIUM 20 MG PO TABS: ORAL_TABLET | ORAL | Status: DC

## 2013-12-22 NOTE — Telephone Encounter (Signed)
Patient requesting RF for atorvastatin 90 day supply.  Rx sent into pharmacy per pt request and [facility] protocol.

## 2014-03-30 ENCOUNTER — Telehealth: Payer: Self-pay | Admitting: Family Medicine

## 2014-03-30 MED ORDER — ATORVASTATIN CALCIUM 20 MG PO TABS: ORAL_TABLET | ORAL | Status: DC

## 2014-03-30 NOTE — Telephone Encounter (Signed)
Rx sent in to pharmacy. 

## 2014-04-01 ENCOUNTER — Ambulatory Visit: Payer: Medicare Other | Admitting: Family Medicine

## 2014-04-10 ENCOUNTER — Ambulatory Visit (INDEPENDENT_AMBULATORY_CARE_PROVIDER_SITE_OTHER): Payer: Medicare Other | Admitting: Family Medicine

## 2014-04-10 ENCOUNTER — Encounter: Payer: Self-pay | Admitting: Family Medicine

## 2014-04-10 VITALS — BP 145/68 | HR 54 | Temp 97.8°F | Resp 18 | Ht 67.0 in | Wt 226.0 lb

## 2014-04-10 DIAGNOSIS — Z0000 Encounter for general adult medical examination without abnormal findings: Secondary | ICD-10-CM | POA: Insufficient documentation

## 2014-04-10 DIAGNOSIS — Z23 Encounter for immunization: Secondary | ICD-10-CM

## 2014-04-10 DIAGNOSIS — E785 Hyperlipidemia, unspecified: Secondary | ICD-10-CM | POA: Diagnosis not present

## 2014-04-10 DIAGNOSIS — I10 Essential (primary) hypertension: Secondary | ICD-10-CM | POA: Diagnosis not present

## 2014-04-10 LAB — LIPID PANEL
Cholesterol: 124 mg/dL (ref 0–200)
HDL: 38.9 mg/dL — AB (ref >39.00)
LDL Cholesterol: 57 mg/dL (ref 0–99)
NonHDL: 85.1
TRIGLYCERIDES: 140 mg/dL (ref 0.0–149.0)
Total CHOL/HDL Ratio: 3
VLDL: 28 mg/dL (ref 0.0–40.0)

## 2014-04-10 LAB — ALT: ALT: 37 U/L (ref 0–53)

## 2014-04-10 LAB — AST: AST: 26 U/L (ref 0–37)

## 2014-04-10 NOTE — Progress Notes (Signed)
Pre visit review using our clinic review tool, if applicable. No additional management support is needed unless otherwise documented below in the visit note. 

## 2014-04-10 NOTE — Progress Notes (Signed)
OFFICE NOTE  04/10/2014  CC:  Chief Complaint  Patient presents with  . Follow-up    fasting     HPI: Patient is a 73 y.o. Caucasian male who is here for 6 mo f/u HTN, hyperlipidemia. Last visit his cholesterol was up so we started 83m atorv qd--he is due for FLP today and is fasting. Usual home bp 1646Osystolic, diastolic never >>03 HR usually 60 or so.  He is trying to be more careful with what he eats: wt is down 6 lbs from last visit.  He feels well: no dizziness, fatigue, HA's, CP, or SOB.   Pertinent PMH:  Past medical, surgical, social, and family history reviewed and no changes are noted since last office visit.  MEDS:  Outpatient Prescriptions Prior to Visit  Medication Sig Dispense Refill  . aspirin EC 81 MG tablet Take 81 mg by mouth daily.      .Marland Kitchenatorvastatin (LIPITOR) 20 MG tablet TAKE 1 TABLET EVERY DAY  90 tablet  1  . Ergocalciferol (VITAMIN D2) 2000 UNITS TABS Take 1 capsule by mouth daily.      .Marland Kitchenlosartan (COZAAR) 50 MG tablet Take 1 tablet (50 mg total) by mouth daily.  90 tablet  3   No facility-administered medications prior to visit.  No otc meds regularly  PE: Blood pressure 145/68, pulse 54, temperature 97.8 F (36.6 C), temperature source Temporal, resp. rate 18, height 5' 7"  (1.702 m), weight 226 lb (102.513 kg), SpO2 100.00%. Gen: Alert, well appearing.  Patient is oriented to person, place, time, and situation. CV: RRR, no m/r/g.   LUNGS: CTA bilat, nonlabored resps, good aeration in all lung fields. EXT: trace pitting edema in both LLs, R>L.  No clubbing or cyanosis.  LABS: none today Last visit:  Lab Results  Component Value Date   CHOL 227* 09/29/2013   HDL 42.80 09/29/2013   LDLCALC 140* 09/29/2013   TRIG 221.0* 09/29/2013   CHOLHDL 5 09/29/2013     IMPRESSION AND PLAN:  1) HTN; The current medical regimen is effective;  continue present plan and medications.  2) Hyperlipidemia; continue atorv 25mqd. Recheck FLP today.  3)  Preventative health care: prevnar 13 IM today.  An After Visit Summary was printed and given to the patient.  FOLLOW UP:  6 mo "CPE"

## 2014-04-10 NOTE — Addendum Note (Signed)
Addended by: Julieta Bellini on: 04/10/2014 08:46 AM   Modules accepted: Orders

## 2014-07-29 DIAGNOSIS — D225 Melanocytic nevi of trunk: Secondary | ICD-10-CM | POA: Diagnosis not present

## 2014-07-29 DIAGNOSIS — D1801 Hemangioma of skin and subcutaneous tissue: Secondary | ICD-10-CM | POA: Diagnosis not present

## 2014-07-29 DIAGNOSIS — L821 Other seborrheic keratosis: Secondary | ICD-10-CM | POA: Diagnosis not present

## 2014-07-29 DIAGNOSIS — Z85828 Personal history of other malignant neoplasm of skin: Secondary | ICD-10-CM | POA: Diagnosis not present

## 2014-09-22 ENCOUNTER — Telehealth: Payer: Self-pay | Admitting: Family Medicine

## 2014-09-22 MED ORDER — LOSARTAN POTASSIUM 50 MG PO TABS: 50.0000 mg | ORAL_TABLET | Freq: Every day | ORAL | Status: DC

## 2014-09-22 MED ORDER — ATORVASTATIN CALCIUM 20 MG PO TABS: ORAL_TABLET | ORAL | Status: DC

## 2014-09-22 NOTE — Telephone Encounter (Signed)
Losartan 50MG & Atorvastatin 20MG. Patient could not get through on automated [facility]. Please send in 90 day supply to [facility].

## 2014-09-22 NOTE — Telephone Encounter (Signed)
Rx sent in to pharmacy. 

## 2014-10-05 DIAGNOSIS — H35033 Hypertensive retinopathy, bilateral: Secondary | ICD-10-CM | POA: Diagnosis not present

## 2014-10-14 ENCOUNTER — Ambulatory Visit (INDEPENDENT_AMBULATORY_CARE_PROVIDER_SITE_OTHER): Payer: Medicare Other | Admitting: Family Medicine

## 2014-10-14 ENCOUNTER — Encounter: Payer: Self-pay | Admitting: Family Medicine

## 2014-10-14 ENCOUNTER — Telehealth: Payer: Self-pay | Admitting: Family Medicine

## 2014-10-14 VITALS — BP 120/54 | HR 58 | Temp 98.3°F | Ht 67.0 in | Wt 231.0 lb

## 2014-10-14 DIAGNOSIS — Z86010 Personal history of colonic polyps: Secondary | ICD-10-CM

## 2014-10-14 DIAGNOSIS — Z125 Encounter for screening for malignant neoplasm of prostate: Secondary | ICD-10-CM

## 2014-10-14 LAB — PSA, MEDICARE: PSA: 0.75 ng/mL (ref 0.10–4.00)

## 2014-10-14 NOTE — Telephone Encounter (Signed)
OK. Will order referral to Dr. Carlean Purl with [facility].

## 2014-10-14 NOTE — Telephone Encounter (Signed)
Please advise 

## 2014-10-14 NOTE — Progress Notes (Signed)
OFFICE NOTE  10/14/2014  CC:  Chief Complaint  Patient presents with  . Follow-up    6 Month   HPI: Patient is a 74 y.o. Caucasian male who is here for 6 mo f/u HTN, hyperlipidemia, obesity. Feeling well. Home bp measurements rare but syst avg 140s, diast 70s. No CP/SOB/palpitations/dizziness, or HA's.  No myalgias.  Pt agreeable to DRE and PSA today for prostate ca screening. He is also due for repeat colonoscopy for hx of adenomatous polyps--he got his recall letter already.  Pertinent PMH:  Past medical, surgical, social, and family history reviewed and no changes are noted since last office visit.  MEDS:  Outpatient Prescriptions Prior to Visit  Medication Sig Dispense Refill  . aspirin EC 81 MG tablet Take 81 mg by mouth daily.    Marland Kitchen atorvastatin (LIPITOR) 20 MG tablet TAKE 1 TABLET EVERY DAY 90 tablet 2  . Ergocalciferol (VITAMIN D2) 2000 UNITS TABS Take 1 capsule by mouth daily.    Marland Kitchen losartan (COZAAR) 50 MG tablet Take 1 tablet (50 mg total) by mouth daily. 90 tablet 2   No facility-administered medications prior to visit.    PE: Blood pressure 120/54, pulse 58, temperature 98.3 F (36.8 C), temperature source Oral, height 5' 7"  (1.702 m), weight 231 lb (104.781 kg), SpO2 97 %. Gen: Alert, well appearing.  Patient is oriented to person, place, time, and situation. Neck: supple/nontender.  No LAD, mass, or TM.  Carotid pulses 2+ bilaterally, without bruits. CV: RRR, no m/r/g.   LUNGS: CTA bilat, nonlabored resps, good aeration in all lung fields. EXT: trace pitting edema in pretibial regions down into ankles bilat.  No rash or tenderness. Rectal exam: negative without mass, lesions or tenderness.  Prostate without nodule, asymmetry, or tenderness.  It did feel diffusely enlarged.   LABS:  Lab Results  Component Value Date   TSH 1.00 09/29/2013   Lab Results  Component Value Date   WBC 9.4 04/14/2013   HGB 16.2 04/14/2013   HCT 46.8 04/14/2013   MCV 90.9  04/14/2013   PLT 161 04/14/2013   Lab Results  Component Value Date   CREATININE 1.1 09/29/2013   BUN 21 09/29/2013   NA 137 09/29/2013   K 4.4 09/29/2013   CL 102 09/29/2013   CO2 27 09/29/2013   Lab Results  Component Value Date   ALT 37 04/10/2014   AST 26 04/10/2014   ALKPHOS 57 09/29/2013   BILITOT 0.7 09/29/2013   Lab Results  Component Value Date   CHOL 124 04/10/2014   Lab Results  Component Value Date   HDL 38.90* 04/10/2014   Lab Results  Component Value Date   LDLCALC 57 04/10/2014   Lab Results  Component Value Date   TRIG 140.0 04/10/2014   Lab Results  Component Value Date   CHOLHDL 3 04/10/2014   Lab Results  Component Value Date   PSA 0.84 09/29/2013   PSA 0.88 09/27/2012   IMPRESSION AND PLAN:  1) HTN; The current medical regimen is effective;  continue present plan and medications. No labs today.  2) Hyperlipidemia: The current medical regimen is effective;  continue present plan and medications. LDL 57 in October 2015, AST/ALT normal at that time.  Plan repeat after 04/2015.  3) Obesity; encouraged him to continue gradual increase in exercise, continue dietary changes.  4) Prostate ca screening: DRE normal today.  PSA drawn today.  An After Visit Summary was printed and given to the patient.  FOLLOW  UP: 6 mo

## 2014-10-14 NOTE — Progress Notes (Signed)
Pre visit review using our clinic review tool, if applicable. No additional management support is needed unless otherwise documented below in the visit note. 

## 2014-10-14 NOTE — Telephone Encounter (Signed)
Patient contacted his prior GI physician Dr.[phone]) & he no longer does colonoscopies in [place], he only does them in [place]. Patient does not want to travel to [place]. Patient would like to referral to a practice in [place] or [place], he would like Dr. Anitra Lauth to recommend who he thinks he should see. Patient has already requested Dr. Ruffin Frederick records to be sent to our office.

## 2014-11-27 ENCOUNTER — Encounter: Payer: Self-pay | Admitting: Internal Medicine

## 2014-12-04 ENCOUNTER — Encounter: Payer: Self-pay | Admitting: Family Medicine

## 2015-01-22 ENCOUNTER — Ambulatory Visit (AMBULATORY_SURGERY_CENTER): Payer: Self-pay | Admitting: *Deleted

## 2015-01-22 VITALS — Ht 68.0 in | Wt 230.8 lb

## 2015-01-22 DIAGNOSIS — Z86010 Personal history of colonic polyps: Secondary | ICD-10-CM

## 2015-01-22 MED ORDER — NA SULFATE-K SULFATE-MG SULF 17.5-3.13-1.6 GM/177ML PO SOLN: 1.0000 | Freq: Once | ORAL | Status: DC

## 2015-01-22 NOTE — Progress Notes (Signed)
No egg or soy allergy No issues with past sedation  no diet pills emmi declined

## 2015-01-31 IMAGING — CR DG HIP (WITH OR WITHOUT PELVIS) 2-3V*L*
3 series · 3 of 3 positions shown · non-contrast
Comparison: Concurrently obtained radiographs of the lumbar spine

CLINICAL DATA: Lower back pain radiating in the hip for 2 weeks

LEFT HIP - COMPLETE 2+ VIEW

[t pelvis a.p.]
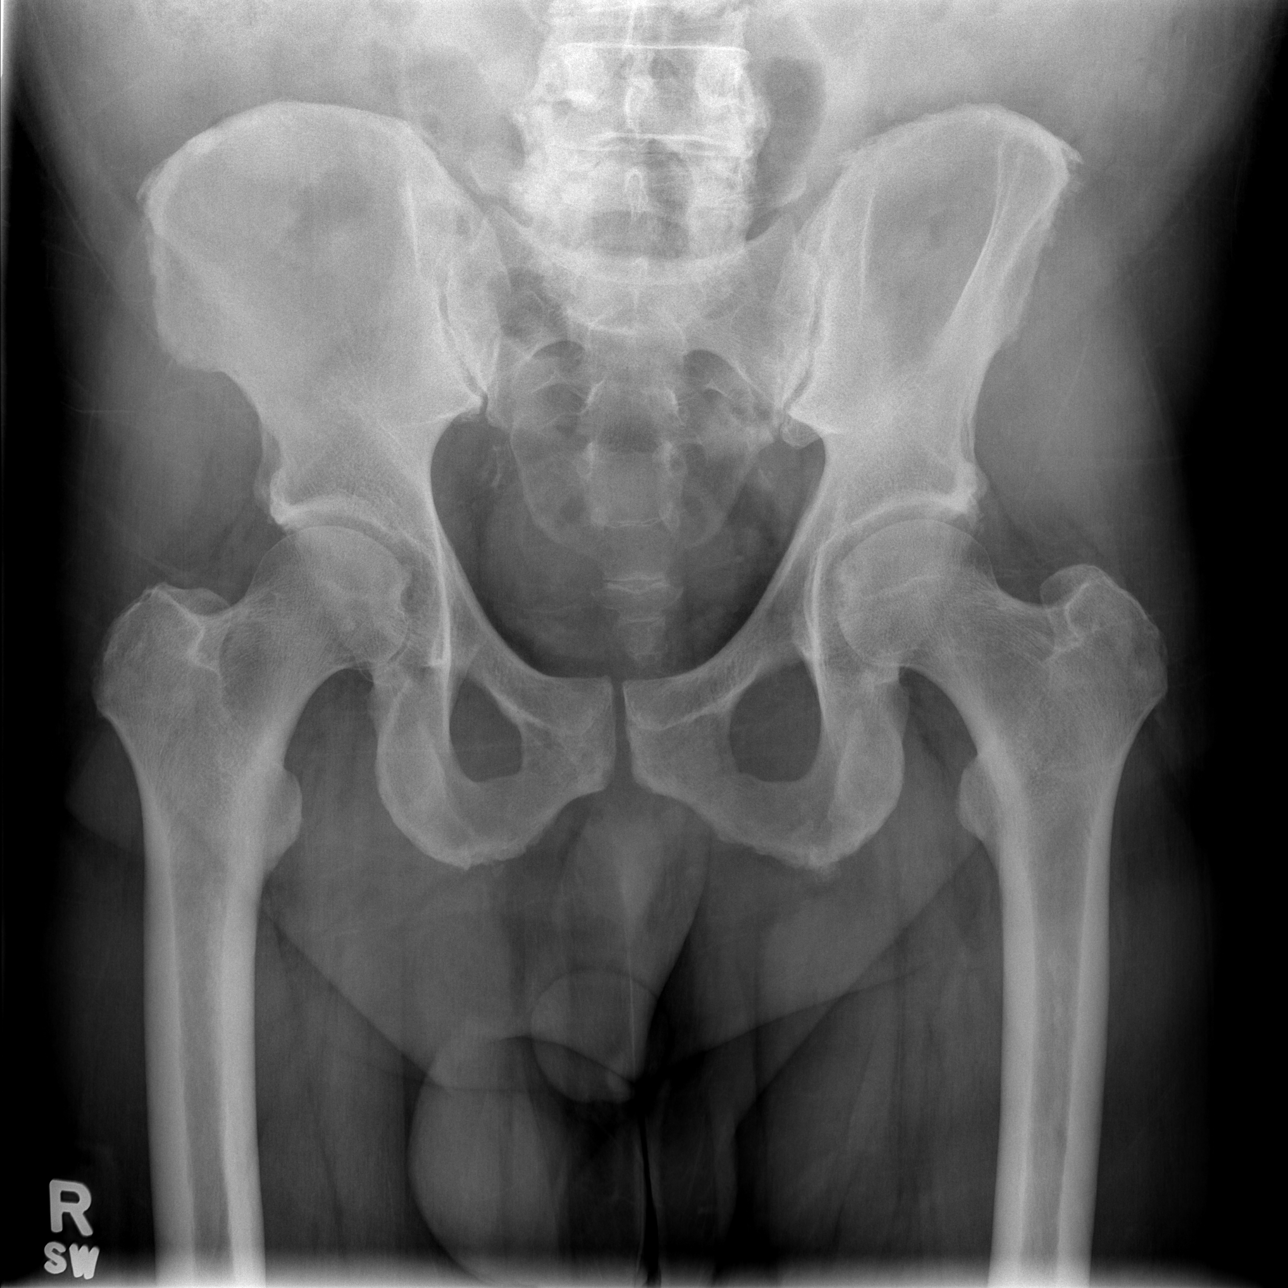

[t hip ap left]
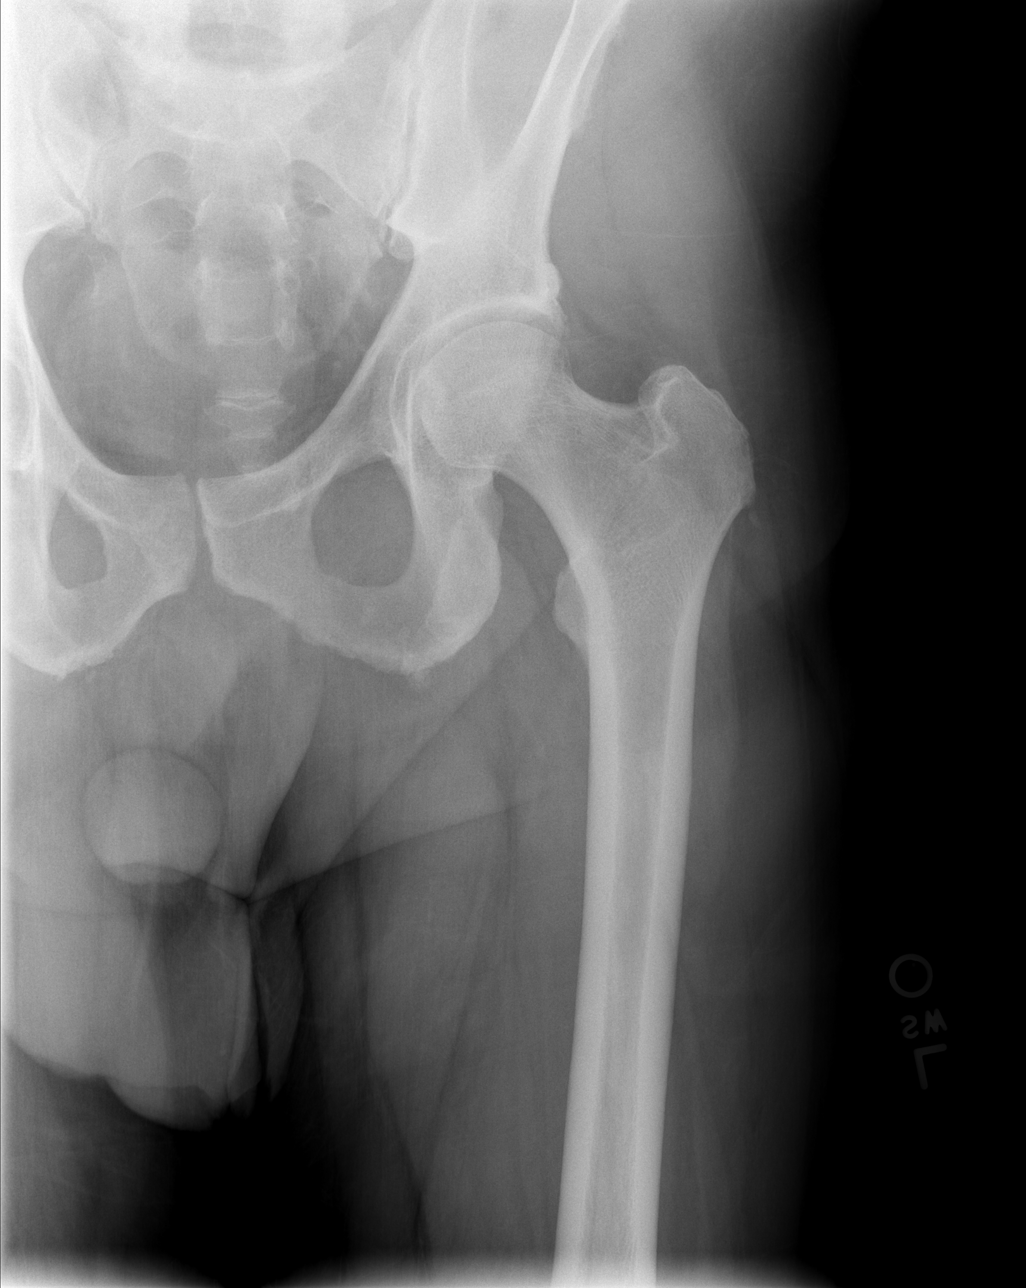

[t hip frog leg left]
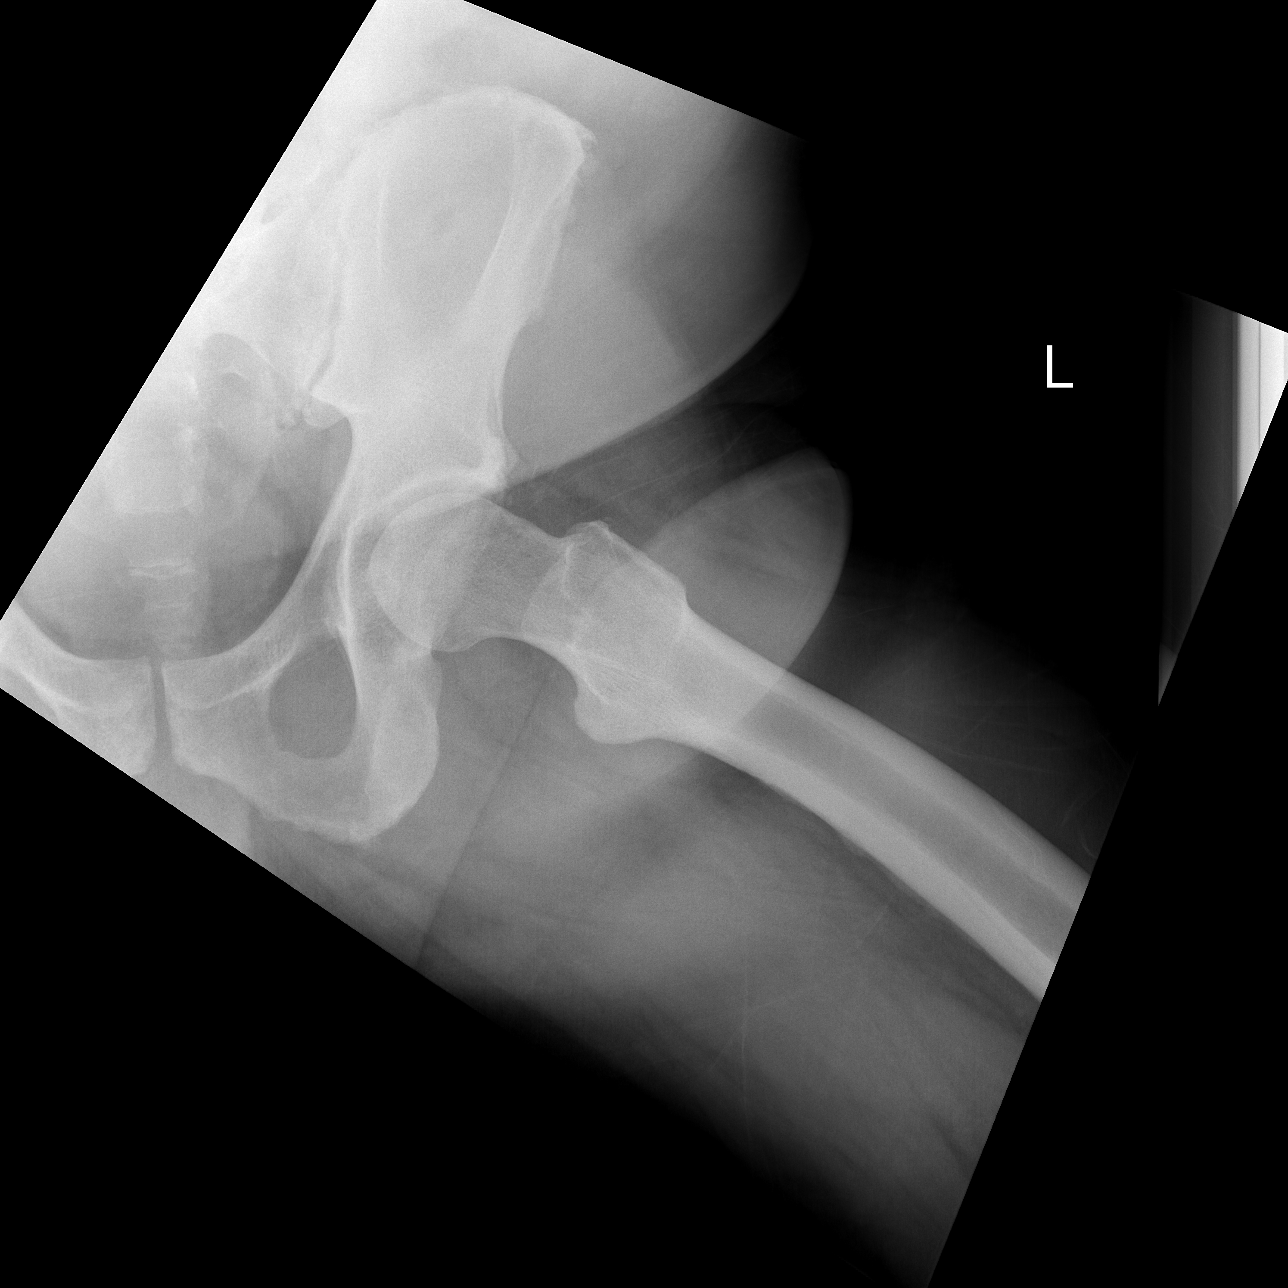

[3 of 3 positions shown; findings below may reference images not displayed]

FINDINGS: Frontal view of the pelvis with additional views of the
left hip demonstrate no acute fracture or malalignment.  Normal
bony mineralization.  No lytic or blastic osseous lesion.  Mild
bilateral hip osteoarthritis slightly worse on the left than the
right.  No acute soft tissue abnormality.
IMPRESSION: 1.  No acute fracture or malalignment.
2.  Mild left hip osteoarthritis.

## 2015-01-31 IMAGING — CR DG LUMBAR SPINE COMPLETE 4+V
5 series · 5 of 5 positions shown · non-contrast
Comparison: Concurrently obtained radiographs of the pelvis left
hip

CLINICAL DATA: Low back pain radiating in the left hip x2 weeks

LUMBAR SPINE - COMPLETE 4+ VIEW

[t l-spine a.p.]
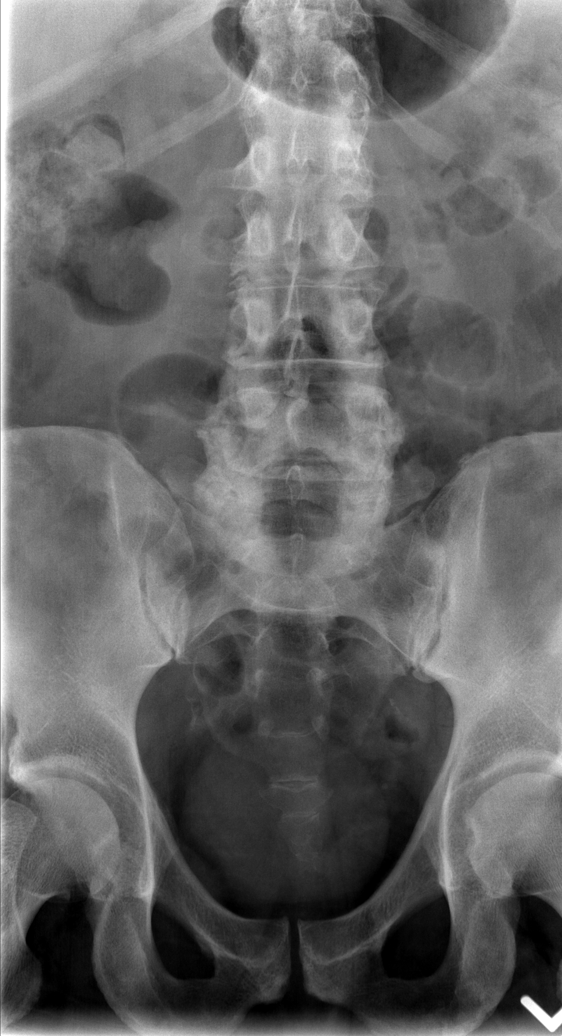

[t l-spine oblique exposure (1 of 2)]
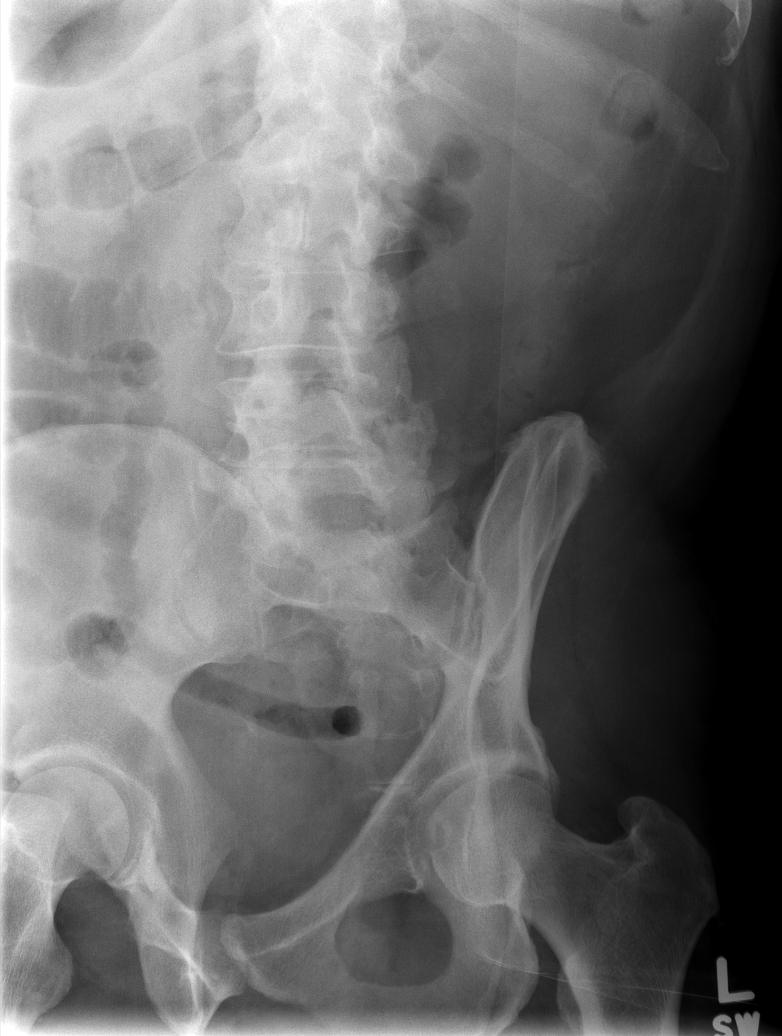

[t l-spine oblique exposure (2 of 2)]
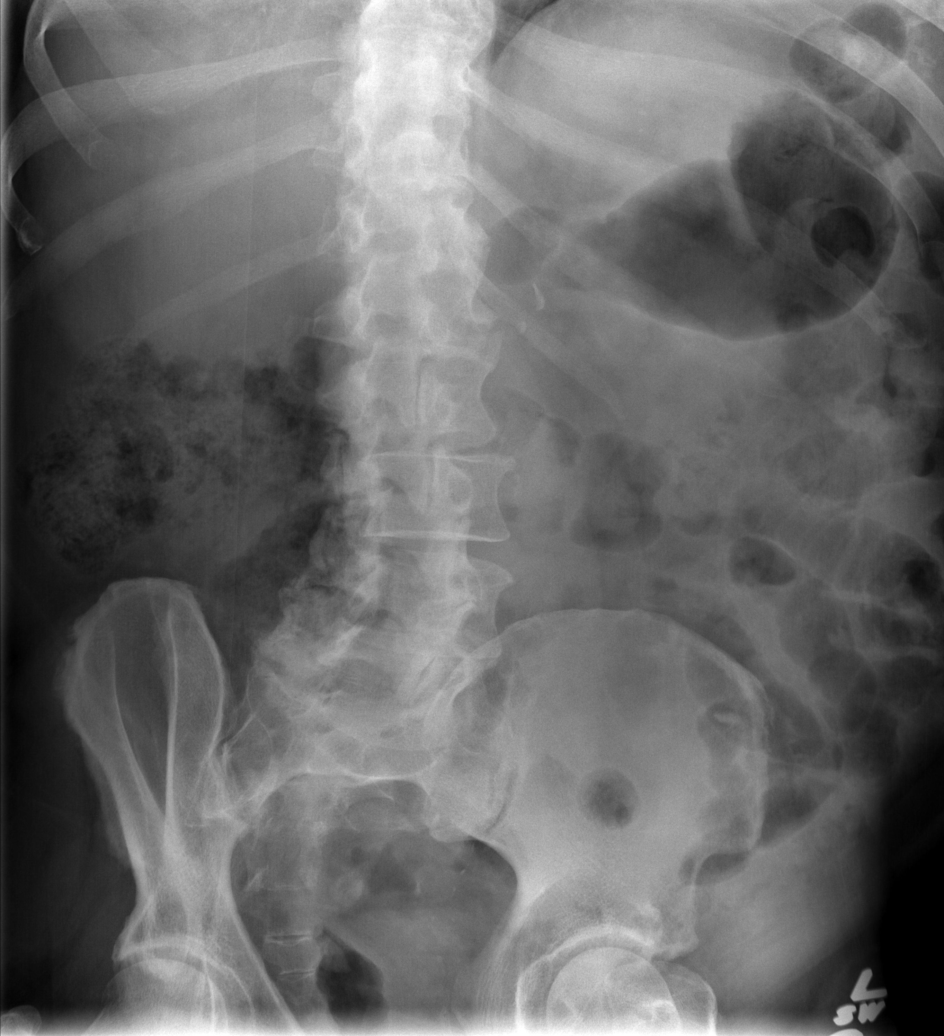

[t l-spine lat]
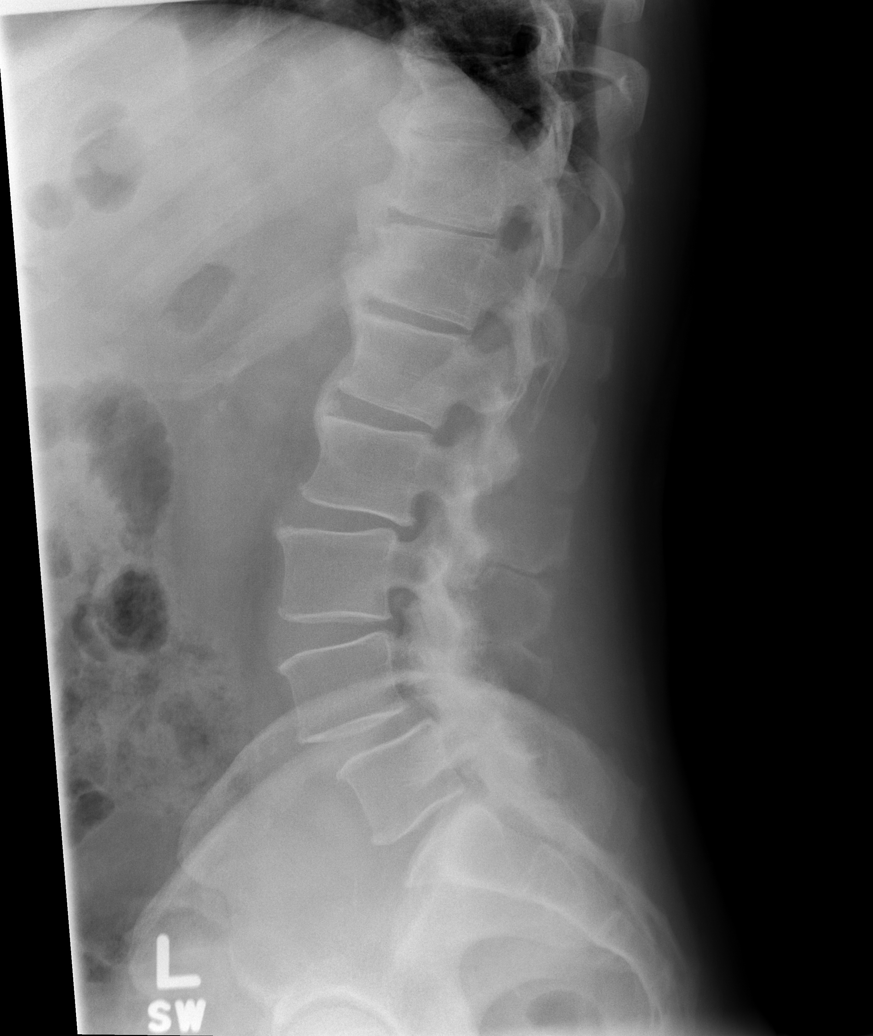

[t l-spine l5-s1 spot]
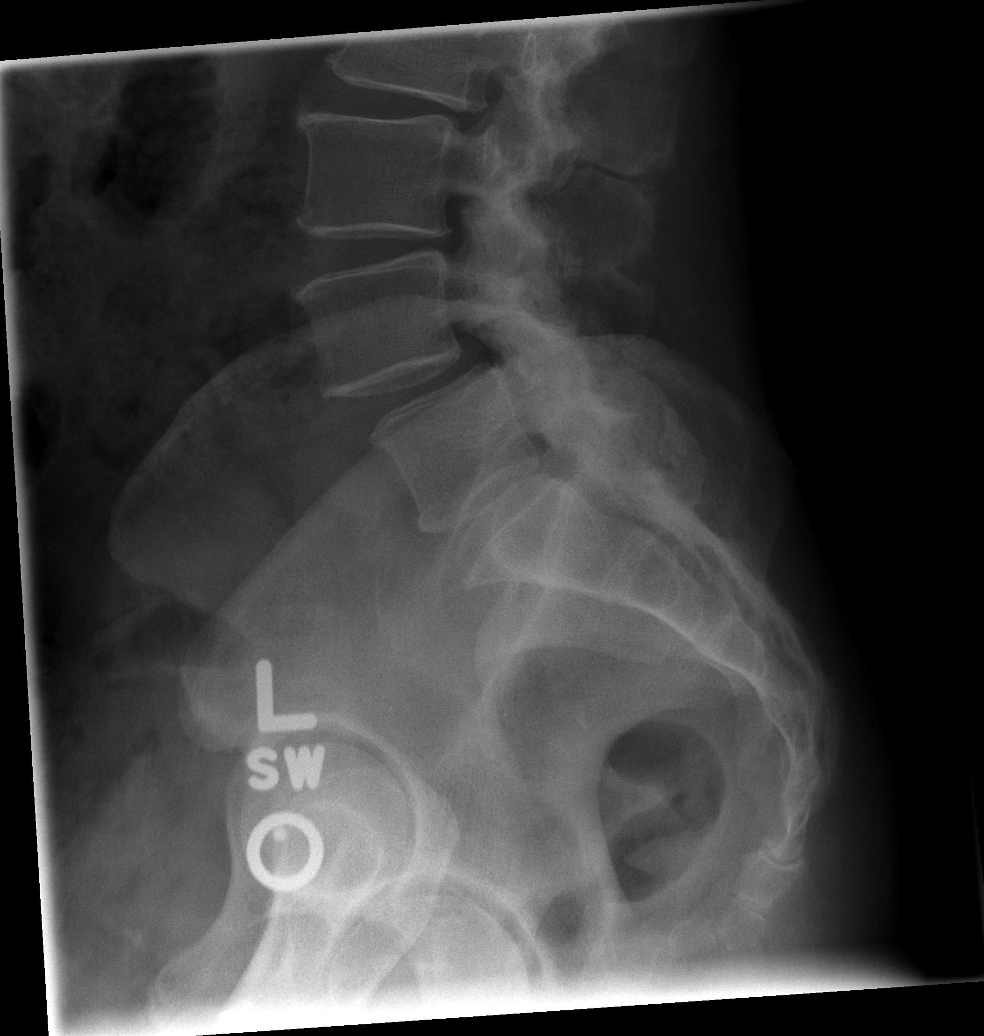

[5 of 5 positions shown; findings below may reference images not displayed]

FINDINGS: Frontal, lateral and bilateral oblique radiographs of the
lumbar spine demonstrate no acute fracture or malalignment.  There
is mild degenerative disc disease with grade 1 anterolisthesis of
L4 and L5.  Extensive facet arthropathy is noted throughout the
lumbar spine most significant at L3-L4, L4-L5 and L5-S1.  Bony
mineralization is within normal limits.  Visualized bowel gas
pattern is unremarkable.
IMPRESSION: 1.  No acute fracture or malalignment.
2.  Extensive lower lumbar facet arthropathy.
3.  Mild grade 1 anterolisthesis of L4 on L5.
4.  Mild multilevel degenerative disc disease.

## 2015-02-03 ENCOUNTER — Encounter: Payer: Self-pay | Admitting: Internal Medicine

## 2015-02-03 ENCOUNTER — Ambulatory Visit (AMBULATORY_SURGERY_CENTER): Payer: Medicare Other | Admitting: Internal Medicine

## 2015-02-03 VITALS — BP 117/56 | HR 61 | Temp 97.7°F | Resp 18 | Ht 68.0 in | Wt 230.0 lb

## 2015-02-03 DIAGNOSIS — D122 Benign neoplasm of ascending colon: Secondary | ICD-10-CM

## 2015-02-03 DIAGNOSIS — D123 Benign neoplasm of transverse colon: Secondary | ICD-10-CM

## 2015-02-03 DIAGNOSIS — Z86010 Personal history of colonic polyps: Secondary | ICD-10-CM | POA: Diagnosis not present

## 2015-02-03 DIAGNOSIS — I10 Essential (primary) hypertension: Secondary | ICD-10-CM | POA: Diagnosis not present

## 2015-02-03 MED ORDER — SODIUM CHLORIDE 0.9 % IV SOLN: 500.0000 mL | INTRAVENOUS | Status: DC

## 2015-02-03 NOTE — Patient Instructions (Signed)
YOU HAD AN ENDOSCOPIC PROCEDURE TODAY AT [facility]:   Refer to the procedure report that was given to you for any specific questions about what was found during the examination.  If the procedure report does not answer your questions, please call your gastroenterologist to clarify.  If you requested that your care partner not be given the details of your procedure findings, then the procedure report has been included in a sealed envelope for you to review at your convenience later.  YOU SHOULD EXPECT: Some feelings of bloating in the abdomen. Passage of more gas than usual.  Walking can help get rid of the air that was put into your GI tract during the procedure and reduce the bloating. If you had a lower endoscopy (such as a colonoscopy or flexible sigmoidoscopy) you may notice spotting of blood in your stool or on the toilet paper. If you underwent a bowel prep for your procedure, you may not have a normal bowel movement for a few days.  Please Note:  You might notice some irritation and congestion in your nose or some drainage.  This is from the oxygen used during your procedure.  There is no need for concern and it should clear up in a day or so.  SYMPTOMS TO REPORT IMMEDIATELY:   Following lower endoscopy (colonoscopy or flexible sigmoidoscopy):  Excessive amounts of blood in the stool  Significant tenderness or worsening of abdominal pains  Swelling of the abdomen that is new, acute  Fever of 100F or higher   For urgent or emergent issues, a gastroenterologist can be reached at any hour by calling [phone].   DIET: Your first meal following the procedure should be a small meal and then it is ok to progress to your normal diet. Heavy or fried foods are harder to digest and may make you feel nauseous or bloated.  Likewise, meals heavy in dairy and vegetables can increase bloating.  Drink plenty of fluids but you should avoid alcoholic beverages for 24  hours.  ACTIVITY:  You should plan to take it easy for the rest of today and you should NOT DRIVE or use heavy machinery until tomorrow (because of the sedation medicines used during the test).    FOLLOW UP: Our staff will call the number listed on your records the next business day following your procedure to check on you and address any questions or concerns that you may have regarding the information given to you following your procedure. If we do not reach you, we will leave a message.  However, if you are feeling well and you are not experiencing any problems, there is no need to return our call.  We will assume that you have returned to your regular daily activities without incident.  If any biopsies were taken you will be contacted by phone or by letter within the next 1-3 weeks.  Please call us at [phone] if you have not heard about the biopsies in 3 weeks.    SIGNATURES/CONFIDENTIALITY: You and/or your care partner have signed paperwork which will be entered into your electronic medical record.  These signatures attest to the fact that that the information above on your After Visit Summary has been reviewed and is understood.  Full responsibility of the confidentiality of this discharge information lies with you and/or your care-partner.    Handouts were given to your care partner on polyps, diverticulosis, hemorrhoids, and a high fiber diet with liberal fluid intake.  You  may resume your current medications today. Await biopsy results. Please call if any questions or concerns.

## 2015-02-03 NOTE — Progress Notes (Signed)
Called to room to assist during endoscopic procedure.  Patient ID and intended procedure confirmed with present staff. Received instructions for my participation in the procedure from the performing physician.  

## 2015-02-03 NOTE — Op Note (Signed)
[place]  [address] [place], [address]   COLONOSCOPY PROCEDURE REPORT  PATIENT: Gregory, Blevins  MR#: [id number] BIRTHDATE: 07/31/1940 , 73  yrs. old GENDER: male ENDOSCOPIST: Jerene Bears, MD REFERRED QM:[id number] Anitra Lauth, M.D. PROCEDURE DATE:  02/03/2015 PROCEDURE:   Colonoscopy, surveillance and Colonoscopy with snare polypectomy First Screening Colonoscopy - Avg.  risk and is 50 yrs.  old or older - No.  Prior Negative Screening - Now for repeat screening. N/A  History of Adenoma - Now for follow-up colonoscopy & has been > or = to 3 yrs.  Yes hx of adenoma.  Has been 3 or more years since last colonoscopy.  Polyps removed today? 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 MEDICATIONS: Monitored anesthesia care and Propofol 250 mg IV  DESCRIPTION OF PROCEDURE:   After the risks benefits and alternatives of the procedure were thoroughly explained, informed consent was obtained.  The digital rectal exam revealed no rectal mass.   The LB [id number]  endoscope was introduced through the anus and advanced to the cecum, which was identified by both the appendix and ileocecal valve. Due to redundancy and a high-riding cecum abdominal counter-pressure was used to advance the scope into the cecum.  The appendiceal orifice was visualized. No adverse events experienced.   The quality of the prep was good. (Suprep was used)  The instrument was then slowly withdrawn as the colon was fully examined. Estimated blood loss is zero unless otherwise noted in this procedure report.   COLON FINDINGS: Seven sessile polyps ranging from 5 to 20m in size were found in the transverse colon and ascending colon. Polypectomies were performed with a cold snare.  The resection was complete, the polyp tissue was completely retrieved and sent to histology.   There was moderate diverticulosis  noted in the descending colon and sigmoid colon with associated muscular hypertrophy.  Retroflexed views revealed internal hemorrhoids. The time to cecum = 5.7 Withdrawal time = 17.5   The scope was withdrawn and the procedure completed.  COMPLICATIONS: There were no immediate complications.    ENDOSCOPIC IMPRESSION: 1.   Seven sessile polyps ranging from 5 to 813min size were found in the transverse colon and ascending colon; polypectomies were performed with a cold snare 2.   There was moderate diverticulosis noted in the descending colon and sigmoid colon  RECOMMENDATIONS: 1.  Await pathology results 2.  High fiber diet 3.  Repeat Colonoscopy in 3 years. 4.  You will receive a letter within 1-2 weeks with the results of your biopsy as well as final recommendations.  Please call my office if you have not received a letter after 3 weeks.  eSigned:  JaJerene BearsMD 02/03/2015 11:49 AM   cc: PhRicardo JerichoMD and The Patient   PATIENT NAME:  Gregory, OsmundsonR#: [id number]

## 2015-02-03 NOTE — Progress Notes (Signed)
Transferred to recovery room. A/O x3, pleased with MAC.  VSS.  Report to Annette, RN. 

## 2015-02-03 NOTE — Progress Notes (Signed)
No problems noted in the recovery room. maw 

## 2015-02-04 ENCOUNTER — Telehealth: Payer: Self-pay

## 2015-02-04 NOTE — Telephone Encounter (Signed)
  Follow up Call-  Call back number 02/03/2015  Post procedure Call Back phone  # [phone]  Permission to leave phone message Yes     Patient questions:  Do you have a fever, pain , or abdominal swelling? No. Pain Score  0 *  Have you tolerated food without any problems? Yes.    Have you been able to return to your normal activities? Yes.    Do you have any questions about your discharge instructions: Diet   No. Medications  No. Follow up visit  No.  Do you have questions or concerns about your Care? No.  Actions: * If pain score is 4 or above: No action needed, pain <4.  No problems per the pt. maw

## 2015-02-08 ENCOUNTER — Encounter: Payer: Self-pay | Admitting: Internal Medicine

## 2015-04-14 ENCOUNTER — Ambulatory Visit: Payer: Medicare Other | Admitting: Family Medicine

## 2015-04-16 ENCOUNTER — Ambulatory Visit (INDEPENDENT_AMBULATORY_CARE_PROVIDER_SITE_OTHER): Payer: Medicare Other | Admitting: Family Medicine

## 2015-04-16 ENCOUNTER — Encounter: Payer: Self-pay | Admitting: Family Medicine

## 2015-04-16 VITALS — BP 139/71 | HR 65 | Temp 98.6°F | Resp 16 | Ht 67.0 in | Wt 234.0 lb

## 2015-04-16 DIAGNOSIS — I10 Essential (primary) hypertension: Secondary | ICD-10-CM

## 2015-04-16 DIAGNOSIS — Z23 Encounter for immunization: Secondary | ICD-10-CM

## 2015-04-16 DIAGNOSIS — E785 Hyperlipidemia, unspecified: Secondary | ICD-10-CM | POA: Diagnosis not present

## 2015-04-16 DIAGNOSIS — E669 Obesity, unspecified: Secondary | ICD-10-CM | POA: Diagnosis not present

## 2015-04-16 NOTE — Progress Notes (Signed)
OFFICE VISIT  04/16/2015   CC:  Chief Complaint  Patient presents with  . Follow-up    Pt is not fasting.    HPI:    Patient is a 74 y.o. Caucasian male who presents for 6 mo f/u HTN, hyperlipidemia, obesity. No home  bp monitoring to report. Compliant with bp and chol med daily, no side effects.  Got life insurance policy so labs done 12/12/76--IONGEXBM results today and they will be scanned into chart. Lipids very good except trigs 203.  Cr stable at 1.14.  LFTs normal.  No urine protein or glucose.  ROS: no HA's, CP, SOB, or vision problems.  Past Medical History  Diagnosis Date  . HTN (hypertension)   . Hyperlipidemia   . Hay fever     no issues now-? out grown  . Obesity   . History of basal cell carcinoma of skin   . Fatty liver 05/2012    u/s done for mild/persistent elevation of LFTs  . Hx of adenomatous colonic polyps 09/2011; 01/2015    Recall 01/2018  . Osteoarthritis     Hips (L>R on x-ray 04/2013), shoulders, and knees (hx of adhesive capsulitis of shoulder)  . Lumbar spondylosis 04/2013    with grade I spondylolisthesis L4 on L5.  . Diverticulosis 2013    noted on colonoscopy  . Allergy   . Cancer [facility])     skin cancer    Past Surgical History  Procedure Laterality Date  . Umbilical hernia repair  2011  . Colonoscopy w/ polypectomy  2008; 09/22/11    Tubular adenoma--recall 3 yrs per Dr. Louanne Belton at [facility].  Marland Kitchen Polypectomy    . Colonoscopy      2008 w/polyps,  09-22-2011 w/polyps.  01/2015 tubular adenoma x 1.  Recall 01/2018.    Outpatient Prescriptions Prior to Visit  Medication Sig Dispense Refill  . aspirin EC 81 MG tablet Take 81 mg by mouth daily.    Marland Kitchen atorvastatin (LIPITOR) 20 MG tablet TAKE 1 TABLET EVERY DAY 90 tablet 2  . Ergocalciferol (VITAMIN D2) 2000 UNITS TABS Take 1 capsule by mouth daily.    Marland Kitchen losartan (COZAAR) 50 MG tablet Take 1 tablet (50 mg total) by mouth daily. 90 tablet 2   No facility-administered medications prior  to visit.    No Known Allergies  ROS As per HPI  PE: Blood pressure 139/71, pulse 65, temperature 98.6 F (37 C), temperature source Oral, resp. rate 16, height 5' 7"  (1.702 m), weight 234 lb (106.142 kg), SpO2 97 %. Gen: Alert, well appearing.  Patient is oriented to person, place, time, and situation. CV: RRR, no m/r/g.   LUNGS: CTA bilat, nonlabored resps, good aeration in all lung fields. EXT: 1+ pitting edema bilat.  No clubbing or cyanosis  LABS:  Lab Results  Component Value Date   CHOL 124 04/10/2014   HDL 38.90* 04/10/2014   LDLCALC 57 04/10/2014   TRIG 140.0 04/10/2014   CHOLHDL 3 04/10/2014     Chemistry      Component Value Date/Time   NA 137 09/29/2013 0853   K 4.4 09/29/2013 0853   CL 102 09/29/2013 0853   CO2 27 09/29/2013 0853   BUN 21 09/29/2013 0853   CREATININE 1.1 09/29/2013 0853      Component Value Date/Time   CALCIUM 9.4 09/29/2013 0853   ALKPHOS 57 09/29/2013 0853   AST 26 04/10/2014 0844   ALT 37 04/10/2014 0844   BILITOT 0.7 09/29/2013  8185     Lab Results  Component Value Date   WBC 9.4 04/14/2013   HGB 16.2 04/14/2013   HCT 46.8 04/14/2013   MCV 90.9 04/14/2013   PLT 161 04/14/2013   Lab Results  Component Value Date   TSH 1.00 09/29/2013     IMPRESSION AND PLAN:  1) HTN: The current medical regimen is effective;  continue present plan and medications. Recent cr stable.  2) Hyperlipidemia: The current medical regimen is effective;  continue present plan and medications. Tolerating statin, recent FLP and AST/ALT 02/10/15 were good.  3) Obesity: needs to increase exercise efforts, push dieting more.  An After Visit Summary was printed and given to the patient.  FOLLOW UP: Return in about 6 months (around 10/15/2015) for AWV.

## 2015-04-16 NOTE — Progress Notes (Signed)
Pre visit review using our clinic review tool, if applicable. No additional management support is needed unless otherwise documented below in the visit note. 

## 2015-06-28 ENCOUNTER — Telehealth: Payer: Self-pay | Admitting: Family Medicine

## 2015-06-28 MED ORDER — ATORVASTATIN CALCIUM 20 MG PO TABS: ORAL_TABLET | ORAL | Status: DC

## 2015-06-28 MED ORDER — LOSARTAN POTASSIUM 50 MG PO TABS: 50.0000 mg | ORAL_TABLET | Freq: Every day | ORAL | Status: DC

## 2015-06-28 NOTE — Telephone Encounter (Signed)
Pt is requesting a refill of his  Astrovastin 89m and his Losartin 53mbe sent to [facility].

## 2015-06-28 NOTE — Telephone Encounter (Signed)
LOV: 04/16/15 NOV: 10/13/15  RF request for atorvastatin Last written: 09/22/14 #90 w/ 2RF  RF request for losartan Last written: 09/22/14 #90 w/ 2RF  Rx's sent. Pt advised and voiced understanding.

## 2015-07-07 ENCOUNTER — Encounter: Payer: Self-pay | Admitting: *Deleted

## 2015-07-28 ENCOUNTER — Telehealth: Payer: Self-pay | Admitting: *Deleted

## 2015-07-28 NOTE — Telephone Encounter (Signed)
Pt called requesting that we fax over a copy of his last colonoscopy and immunization record to [facility]: Dr. Luther Redo. Fax: [phone] . Records faxed as requested.

## 2015-08-04 DIAGNOSIS — L821 Other seborrheic keratosis: Secondary | ICD-10-CM | POA: Diagnosis not present

## 2015-08-04 DIAGNOSIS — D1801 Hemangioma of skin and subcutaneous tissue: Secondary | ICD-10-CM | POA: Diagnosis not present

## 2015-08-04 DIAGNOSIS — D225 Melanocytic nevi of trunk: Secondary | ICD-10-CM | POA: Diagnosis not present

## 2015-10-13 ENCOUNTER — Encounter: Payer: Self-pay | Admitting: Family Medicine

## 2015-10-13 ENCOUNTER — Ambulatory Visit (INDEPENDENT_AMBULATORY_CARE_PROVIDER_SITE_OTHER): Payer: Medicare Other | Admitting: Family Medicine

## 2015-10-13 ENCOUNTER — Other Ambulatory Visit (INDEPENDENT_AMBULATORY_CARE_PROVIDER_SITE_OTHER): Payer: Medicare Other

## 2015-10-13 VITALS — BP 131/78 | HR 59 | Temp 98.4°F | Resp 16 | Ht 66.25 in | Wt 232.2 lb

## 2015-10-13 DIAGNOSIS — Z1159 Encounter for screening for other viral diseases: Secondary | ICD-10-CM

## 2015-10-13 DIAGNOSIS — Z0000 Encounter for general adult medical examination without abnormal findings: Secondary | ICD-10-CM

## 2015-10-13 DIAGNOSIS — Z114 Encounter for screening for human immunodeficiency virus [HIV]: Secondary | ICD-10-CM | POA: Diagnosis not present

## 2015-10-13 DIAGNOSIS — I10 Essential (primary) hypertension: Secondary | ICD-10-CM

## 2015-10-13 DIAGNOSIS — H9190 Unspecified hearing loss, unspecified ear: Secondary | ICD-10-CM

## 2015-10-13 LAB — BASIC METABOLIC PANEL
BUN: 25 mg/dL — AB (ref 6–23)
CALCIUM: 9.4 mg/dL (ref 8.4–10.5)
CO2: 27 mEq/L (ref 19–32)
CREATININE: 1.08 mg/dL (ref 0.40–1.50)
Chloride: 103 mEq/L (ref 96–112)
GFR: 70.94 mL/min (ref >60.00)
GLUCOSE: 92 mg/dL (ref 70–99)
POTASSIUM: 4.4 meq/L (ref 3.5–5.1)
Sodium: 138 mEq/L (ref 135–145)

## 2015-10-13 LAB — HEPATITIS C ANTIBODY: HCV Ab: NEGATIVE

## 2015-10-13 NOTE — Progress Notes (Signed)
Pre visit review using our clinic review tool, if applicable. No additional management support is needed unless otherwise documented below in the visit note. 

## 2015-10-13 NOTE — Progress Notes (Addendum)
The patient is here for annual Medicare wellness examination and management of other chronic and acute problems. Other problems discussed today: pt had an agent orange evaluation at the [place] recently, got into the registry.   AWV DATA The risk factors are reflected in the social history.  The roster of all physicians providing medical care to patient is listed in the Snapshot section of the chart.  Activities of daily living:  The patient is 100% independent in all ADLs: dressing, toileting, feeding as well as independent mobility.  Home safety : The patient has smoke detectors in the home. They wear seatbelts. No firearms at home ( firearms are present in the home, kept in a safe fashion). There is no violence in the home.   There is no risks for hepatitis, STDs or HIV. There is no history of blood transfusion. They have no travel history to infectious disease endemic areas of the world.  The patient has seen their dentist in the last six month. They have seen their eye doctor in the last year. They deny deny any hearing difficulty and have not had audiologic testing in the last year.  They do not  have excessive sun exposure. Discussed the need for sun protection: hats, long sleeves and use of sunscreen if there is significant sun exposure.   Diet: the importance of a healthy diet is discussed. They do have a healthy diet.  The patient has no regular exercise program:  The benefits of regular aerobic exercise were discussed.  Depression screen: there are no signs or vegative symptoms of depression- irritability, change in appetite, anhedonia, sadness/tearfullness.  Cognitive assessment: the patient manages all their financial and personal affairs and is actively engaged. They could relate day,date,year and events; recalled 1/3 objects at 3 minutes; performed clock-face test normally.  Reviewed advance directives with pt today.  The following portions of the patient's history were reviewed  and updated as appropriate: allergies, current medications, past family history, past medical history,  past surgical history, past social history  and problem list.  Vision, hearing, body mass index were assessed and reviewed. Body mass index is 37.19 kg/(m^2).    Hearing Screening   125Hz  250Hz  500Hz  1000Hz  2000Hz  4000Hz  8000Hz   Right ear:   20 20 45 65   Left ear:   25 20 20  60     During the course of the visit the patient was educated and counseled about appropriate screening and preventive services including :  Annual wellness visit -doing today. diabetes screening-will do today (pt is fasting). colorectal cancer screening-pt is getting this addressed already/plan in place for next colonoscopy recommended immunizations (influenza, pneumococcal, Hep B)--Pt UTD on appropriate vaccines. Bone mass measurement--N/A. Counseling to prevent tobacco use--N/A Depression screening-NA Glaucoma screening: N/A Hepatitis C virus screening--will do today. HIV virus screening--will do today. Lung cancer screening--N/A Medical nutrition therapy--N/a Prostate cancer screening--Pt chooses to d/c screening at this time. Screening mammography-N/A Screening pap tests, pelvic exam, and clinical breast exam-N/A Ultrasound screening for AAA--N/A  A written plan of action regarding the above screening and preventative services was given to the patient today.  Will refer pt to Audiologist, check fasting glucose today, screen for HIV and Hep C viruses today. He has made himself a routine eye exam appt already.  An After Visit Summary was printed and given to the patient.  Signed:  Crissie Sickles, MD           10/13/2015

## 2015-10-13 NOTE — Addendum Note (Signed)
Addended by: Onalee Hua on: 10/13/2015 09:38 AM   Modules accepted: Orders

## 2015-10-14 LAB — HIV ANTIBODY (ROUTINE TESTING W REFLEX): HIV: NONREACTIVE

## 2015-10-21 ENCOUNTER — Encounter: Payer: Self-pay | Admitting: Family Medicine

## 2015-10-21 DIAGNOSIS — Z125 Encounter for screening for malignant neoplasm of prostate: Secondary | ICD-10-CM | POA: Insufficient documentation

## 2015-10-29 DIAGNOSIS — H524 Presbyopia: Secondary | ICD-10-CM | POA: Diagnosis not present

## 2015-10-29 DIAGNOSIS — H5203 Hypermetropia, bilateral: Secondary | ICD-10-CM | POA: Diagnosis not present

## 2015-10-29 DIAGNOSIS — H2513 Age-related nuclear cataract, bilateral: Secondary | ICD-10-CM | POA: Diagnosis not present

## 2015-10-29 DIAGNOSIS — H35033 Hypertensive retinopathy, bilateral: Secondary | ICD-10-CM | POA: Diagnosis not present

## 2015-11-01 DIAGNOSIS — Z822 Family history of deafness and hearing loss: Secondary | ICD-10-CM | POA: Diagnosis not present

## 2015-11-01 DIAGNOSIS — H903 Sensorineural hearing loss, bilateral: Secondary | ICD-10-CM | POA: Diagnosis not present

## 2015-11-25 ENCOUNTER — Encounter: Payer: Self-pay | Admitting: Family Medicine

## 2016-03-22 ENCOUNTER — Encounter: Payer: Self-pay | Admitting: Family Medicine

## 2016-03-22 ENCOUNTER — Other Ambulatory Visit: Payer: Medicare Other

## 2016-03-22 ENCOUNTER — Ambulatory Visit (INDEPENDENT_AMBULATORY_CARE_PROVIDER_SITE_OTHER): Payer: Medicare Other | Admitting: Family Medicine

## 2016-03-22 ENCOUNTER — Other Ambulatory Visit (INDEPENDENT_AMBULATORY_CARE_PROVIDER_SITE_OTHER): Payer: Medicare Other

## 2016-03-22 VITALS — BP 136/78 | HR 67 | Temp 98.2°F | Resp 18 | Ht 67.25 in | Wt 236.0 lb

## 2016-03-22 DIAGNOSIS — I10 Essential (primary) hypertension: Secondary | ICD-10-CM | POA: Diagnosis not present

## 2016-03-22 DIAGNOSIS — Z23 Encounter for immunization: Secondary | ICD-10-CM | POA: Diagnosis not present

## 2016-03-22 DIAGNOSIS — E785 Hyperlipidemia, unspecified: Secondary | ICD-10-CM

## 2016-03-22 DIAGNOSIS — Z0000 Encounter for general adult medical examination without abnormal findings: Secondary | ICD-10-CM

## 2016-03-22 LAB — COMPREHENSIVE METABOLIC PANEL
ALBUMIN: 4.3 g/dL (ref 3.5–5.2)
ALK PHOS: 68 U/L (ref 39–117)
ALT: 41 U/L (ref 0–53)
AST: 29 U/L (ref 0–37)
BILIRUBIN TOTAL: 0.7 mg/dL (ref 0.2–1.2)
BUN: 21 mg/dL (ref 6–23)
CO2: 27 mEq/L (ref 19–32)
CREATININE: 1.05 mg/dL (ref 0.40–1.50)
Calcium: 9.1 mg/dL (ref 8.4–10.5)
Chloride: 102 mEq/L (ref 96–112)
GFR: 73.2 mL/min (ref >60.00)
GLUCOSE: 87 mg/dL (ref 70–99)
POTASSIUM: 4.4 meq/L (ref 3.5–5.1)
SODIUM: 138 meq/L (ref 135–145)
TOTAL PROTEIN: 7.4 g/dL (ref 6.0–8.3)

## 2016-03-22 LAB — LIPID PANEL
CHOLESTEROL: 144 mg/dL (ref 0–200)
HDL: 38.7 mg/dL — ABNORMAL LOW (ref >39.00)
NonHDL: 104.91
Total CHOL/HDL Ratio: 4
Triglycerides: 235 mg/dL — ABNORMAL HIGH (ref 0.0–149.0)
VLDL: 47 mg/dL — ABNORMAL HIGH (ref 0.0–40.0)

## 2016-03-22 LAB — LDL CHOLESTEROL, DIRECT: LDL DIRECT: 76 mg/dL

## 2016-03-22 NOTE — Progress Notes (Signed)
Pre visit review using our clinic review tool, if applicable. No additional management support is needed unless otherwise documented below in the visit note. 

## 2016-03-22 NOTE — Progress Notes (Signed)
Office Note 03/22/2016  CC:  Chief Complaint  Patient presents with  . Annual Exam    CPE    HPI:  Gregory Blevins is a 75 y.o. White male who is here for annual health maintenance exam. Exercise: walks some, keeps busy working on 2 houses. Says diet is healthy. Rare alcohol intake.  He goes to the [place] about once every 2 years for Agent Orange exam/labs. Most recent labs 09/22/15.  PSA was 0.82.  We discussed prostate ca screening today and decided to discontinue any further screening for this.  No acute complaints. Eye exam UTD.   Past Medical History:  Diagnosis Date  . Allergy   . Cancer ([place])    skin cancer  . Diverticulosis 2013   noted on colonoscopy  . Fatty liver 05/2012   u/s done for mild/persistent elevation of LFTs  . Hay fever    no issues now-? out grown  . Hearing impairment    11/2015 Audiology eval= not yet ready for hearing aids ([facility])  . History of basal cell carcinoma of skin   . HTN (hypertension)   . Hx of adenomatous colonic polyps 09/2011; 01/2015   Recall 01/2018  . Hyperlipidemia   . Lumbar spondylosis 04/2013   with grade I spondylolisthesis L4 on L5.  . Obesity   . 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 COLONOSCOPY     2008 w/polyps,  09-22-2011 w/polyps.  01/2015 tubular adenoma x 1.  Recall 01/2018.  Marland Kitchen COLONOSCOPY W/ POLYPECTOMY  2008; 09/22/11;01/2015   Tubular adenoma--recall 3 yrs per Dr. Louanne Belton at [facility].  Then switched to Dr. Hilarie Fredrickson with [facility].  Marland Kitchen POLYPECTOMY    . UMBILICAL HERNIA REPAIR  2011    Family History  Problem Relation Age of Onset  . Hypertension Father   . Colon cancer Father   . Alcoholism Father   . Stomach cancer Father   . Colon polyps Neg Hx   . Esophageal cancer Neg Hx   . Rectal cancer Neg Hx     Social History   Social History  .  Marital status: Married    Spouse name: N/A  . Number of children: N/A  . Years of education: N/A   Occupational History  . Not on file.   Social History Main Topics  . Smoking status: Never Smoker  . Smokeless tobacco: Never Used  . Alcohol use Yes     Comment: occassional  . Drug use: No  . Sexual activity: Not on file   Other Topics Concern  . Not on file   Social History Narrative   Married, 2 children, 3 grandchildren.   Occupation: retired Financial trader   Education: HS   No tob, minimal alcohol, no drugs.     Outpatient Medications Prior to Visit  Medication Sig Dispense Refill  . aspirin EC 81 MG tablet Take 81 mg by mouth daily.    Marland Kitchen atorvastatin (LIPITOR) 20 MG tablet TAKE 1 TABLET EVERY DAY 90 tablet 3  . Ergocalciferol (VITAMIN D2) 2000 UNITS TABS Take 1 capsule by mouth daily.    Marland Kitchen losartan (COZAAR) 50 MG tablet Take 1 tablet (50 mg total) by mouth daily. 90 tablet 3   No facility-administered medications prior to visit.     No Known Allergies  ROS Review of Systems  Constitutional: Negative for appetite change, chills, fatigue and fever.  HENT: Negative for congestion, dental problem, ear pain and sore throat.   Eyes: Negative for discharge, redness and visual disturbance.  Respiratory: Negative for cough, chest tightness, shortness of breath and wheezing.   Cardiovascular: Negative for chest pain, palpitations and leg swelling.  Gastrointestinal: Negative for abdominal pain, blood in stool, diarrhea, nausea and vomiting.  Genitourinary: Negative for difficulty urinating, dysuria, flank pain, frequency, hematuria and urgency.  Musculoskeletal: Negative for arthralgias, back pain, joint swelling, myalgias and neck stiffness.  Skin: Negative for pallor and rash.  Neurological: Negative for dizziness, speech difficulty, weakness and headaches.  Hematological: Negative for adenopathy. Does not bruise/bleed easily.  Psychiatric/Behavioral:  Negative for confusion and sleep disturbance. The patient is not nervous/anxious.     PE; Blood pressure 136/78, pulse 67, temperature 98.2 F (36.8 C), temperature source Oral, resp. rate 18, height 5' 7.25" (1.708 m), weight 236 lb (107 kg), SpO2 99 %. Gen: Alert, well appearing.  Patient is oriented to person, place, time, and situation. AFFECT: pleasant, lucid thought and speech. ENT: Ears: EACs clear, normal epithelium.  TMs with good light reflex and landmarks bilaterally.  Eyes: no injection, icteris, swelling, or exudate.  EOMI, PERRLA. Nose: no drainage or turbinate edema/swelling.  No injection or focal lesion.  Mouth: lips without lesion/swelling.  Oral mucosa pink and moist.  Dentition intact and without obvious caries or gingival swelling.  Oropharynx without erythema, exudate, or swelling.  Neck: supple/nontender.  No LAD, mass, or TM.  Carotid pulses 2+ bilaterally, without bruits. CV: RRR, no m/r/g.   LUNGS: CTA bilat, nonlabored resps, good aeration in all lung fields. ABD: soft, NT, ND, BS normal.  No hepatospenomegaly or mass.  No bruits. EXT: no clubbing, cyanosis, or edema.  Musculoskeletal: no joint swelling, erythema, warmth, or tenderness.  ROM of all joints intact. Skin - no sores or suspicious lesions or rashes or color changes   Pertinent labs:  Lab Results  Component Value Date   TSH 1.00 09/29/2013   Lab Results  Component Value Date   WBC 9.4 04/14/2013   HGB 16.2 04/14/2013   HCT 46.8 04/14/2013   MCV 90.9 04/14/2013   PLT 161 04/14/2013   Lab Results  Component Value Date   CREATININE 1.08 10/13/2015   BUN 25 (H) 10/13/2015   NA 138 10/13/2015   K 4.4 10/13/2015   CL 103 10/13/2015   CO2 27 10/13/2015   Lab Results  Component Value Date   ALT 37 04/10/2014   AST 26 04/10/2014   ALKPHOS 57 09/29/2013   BILITOT 0.7 09/29/2013   Lab Results  Component Value Date   CHOL 124 04/10/2014   Lab Results  Component Value Date   HDL 38.90 (L)  04/10/2014   Lab Results  Component Value Date   LDLCALC 57 04/10/2014   Lab Results  Component Value Date   TRIG 140.0 04/10/2014   Lab Results  Component Value Date   CHOLHDL 3 04/10/2014   Lab Results  Component Value Date   PSA 0.75 10/14/2014   PSA 0.84 09/29/2013   PSA 0.88 09/27/2012    ASSESSMENT AND PLAN:   Health maintenance exam: Reviewed age and gender appropriate health maintenance issues (prudent diet, regular exercise, health risks of tobacco and excessive alcohol, use of seatbelts, fire alarms in home, use of sunscreen).  Also reviewed age and gender appropriate health screening as well as vaccine recommendations. High dose flu vaccine given today. Made  decision with pt today to discontinue prostate ca screening. Colon ca screening/hx of adenomatous colon polyps: due for repeat colonoscopy after 01/2018. CMET and FLP today for monitoring for his HLD and HTN.  An After Visit Summary was printed and given to the patient.  FOLLOW UP:  Return in about 6 months (around 09/19/2016) for routine chronic illness f/u.  Signed:  Crissie Sickles, MD           03/22/2016

## 2016-03-22 NOTE — Addendum Note (Signed)
Addended by: Gordy Councilman on: 03/22/2016 09:53 AM   Modules accepted: Orders

## 2016-06-26 ENCOUNTER — Other Ambulatory Visit: Payer: Self-pay | Admitting: Family Medicine

## 2016-06-26 MED ORDER — LOSARTAN POTASSIUM 50 MG PO TABS: 50.0000 mg | ORAL_TABLET | Freq: Every day | ORAL | 3 refills | Status: DC

## 2016-06-26 MED ORDER — ATORVASTATIN CALCIUM 20 MG PO TABS: ORAL_TABLET | ORAL | 3 refills | Status: DC

## 2016-08-02 DIAGNOSIS — D1801 Hemangioma of skin and subcutaneous tissue: Secondary | ICD-10-CM | POA: Diagnosis not present

## 2016-08-02 DIAGNOSIS — L814 Other melanin hyperpigmentation: Secondary | ICD-10-CM | POA: Diagnosis not present

## 2016-08-02 DIAGNOSIS — Z85828 Personal history of other malignant neoplasm of skin: Secondary | ICD-10-CM | POA: Diagnosis not present

## 2016-08-02 DIAGNOSIS — L570 Actinic keratosis: Secondary | ICD-10-CM | POA: Diagnosis not present

## 2016-08-02 DIAGNOSIS — L821 Other seborrheic keratosis: Secondary | ICD-10-CM | POA: Diagnosis not present

## 2016-08-14 DIAGNOSIS — J101 Influenza due to other identified influenza virus with other respiratory manifestations: Secondary | ICD-10-CM | POA: Diagnosis not present

## 2016-09-19 NOTE — Progress Notes (Signed)
OFFICE VISIT  09/20/2016   CC:  Chief Complaint  Patient presents with  . Follow-up    RCI, pt is fasting.      HPI:    Patient is a 76 y.o. Caucasian male who presents for 6 mo f/u HTN, HLD, obesity, and hepatic steatosis. Says he has been feeling well.  HTN: checks bp about 1 time per month: 126-135/70-89.    Cholest: taking statin daily w/out side effect.  Diet: trying to cut back on calories and portion size.  Says he still snacks too much.  Exercise: does some bed exercises for hips.  Does a little walking.  Says he has toenail fungus on both big toes, says his foot doctor rx'd ciclopirox nail laquer but use of this for a few months has not helped. No pain in nail.  He wants to change to the ciclopirox gel b/c it is less sticky.  Past Medical History:  Diagnosis Date  . Allergy   . Cancer ([place])    skin cancer  . Diverticulosis 2013   noted on colonoscopy  . Fatty liver 05/2012   u/s done for mild/persistent elevation of LFTs  . Hay fever    no issues now-? out grown  . Hearing impairment    11/2015 Audiology eval= not yet ready for hearing aids ([facility])  . History of basal cell carcinoma of skin   . HTN (hypertension)   . Hx of adenomatous colonic polyps 09/2011; 01/2015   Recall 01/2018  . Hyperlipidemia   . Lumbar spondylosis 04/2013   with grade I spondylolisthesis L4 on L5.  . Obesity   . 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 COLONOSCOPY     2008 w/polyps,  09-22-2011 w/polyps.  01/2015 tubular adenoma x 1.  Recall 01/2018.  Marland Kitchen COLONOSCOPY W/ POLYPECTOMY  2008; 09/22/11;01/2015   Tubular adenoma--recall 3 yrs per Dr. Louanne Belton at [facility].  Then switched to Dr. Hilarie Fredrickson with [facility].  Marland Kitchen POLYPECTOMY    . UMBILICAL HERNIA REPAIR  2011    Outpatient Medications Prior to Visit  Medication Sig Dispense Refill   . aspirin EC 81 MG tablet Take 81 mg by mouth daily.    Marland Kitchen atorvastatin (LIPITOR) 20 MG tablet TAKE 1 TABLET EVERY DAY 90 tablet 3  . Ergocalciferol (VITAMIN D2) 2000 UNITS TABS Take 1 capsule by mouth daily.    Marland Kitchen losartan (COZAAR) 50 MG tablet Take 1 tablet (50 mg total) by mouth daily. 90 tablet 3   No facility-administered medications prior to visit.     No Known Allergies  ROS As per HPI  PE: Blood pressure 140/72, pulse 72, temperature 98.2 F (36.8 C), temperature source Oral, resp. rate 16, height 5' 7.25" (1.708 m), weight 235 lb 8 oz (106.8 kg), SpO2 98 %. Gen: Alert, well appearing.  Patient is oriented to person, place, time, and situation. AFFECT: pleasant, lucid thought and speech. CV: RRR, no m/r/g.   LUNGS: CTA bilat, nonlabored resps, good aeration in all lung fields. EXT: no clubbing or cyanosis.  Trace bilat pitting edema. Great toes: some thick white substance under each great toe, with some toenail thickening.  Other toenails were normal.  LABS:  Lab Results  Component Value Date   TSH 1.00 09/29/2013   Lab Results  Component Value Date   WBC 9.4 04/14/2013  HGB 16.2 04/14/2013   HCT 46.8 04/14/2013   MCV 90.9 04/14/2013   PLT 161 04/14/2013   Lab Results  Component Value Date   CREATININE 1.05 03/22/2016   BUN 21 03/22/2016   NA 138 03/22/2016   K 4.4 03/22/2016   CL 102 03/22/2016   CO2 27 03/22/2016   Lab Results  Component Value Date   ALT 41 03/22/2016   AST 29 03/22/2016   ALKPHOS 68 03/22/2016   BILITOT 0.7 03/22/2016   Lab Results  Component Value Date   CHOL 144 03/22/2016   Lab Results  Component Value Date   HDL 38.70 (L) 03/22/2016   Lab Results  Component Value Date   LDLCALC 57 04/10/2014   Lab Results  Component Value Date   TRIG 235.0 (H) 03/22/2016   Lab Results  Component Value Date   CHOLHDL 4 03/22/2016   Lab Results  Component Value Date   PSA 0.75 10/14/2014   PSA 0.84 09/29/2013   PSA 0.88  09/27/2012    IMPRESSION AND PLAN:  1) HTN: The current medical regimen is effective;  continue present plan and medications. Check lytes/cr today.  2) HLD: tolerating statin.  Recheck FLP today.  3) Obesity, with hx of hepatic steatosis. Needs to get more aggressive with diet/exercise.  4) Onychomycosis, both great toenails. Change to ciclopirox gel as per pt request.  An After Visit Summary was printed and given to the patient.  FOLLOW UP: Return in about 6 months (around 03/23/2017) for routine chronic illness f/u.  Signed:  Crissie Sickles, MD           09/20/2016

## 2016-09-20 ENCOUNTER — Ambulatory Visit (INDEPENDENT_AMBULATORY_CARE_PROVIDER_SITE_OTHER): Payer: Medicare Other | Admitting: Family Medicine

## 2016-09-20 ENCOUNTER — Other Ambulatory Visit (INDEPENDENT_AMBULATORY_CARE_PROVIDER_SITE_OTHER): Payer: Medicare Other

## 2016-09-20 ENCOUNTER — Encounter: Payer: Self-pay | Admitting: Family Medicine

## 2016-09-20 VITALS — BP 140/72 | HR 72 | Temp 98.2°F | Resp 16 | Ht 67.25 in | Wt 235.5 lb

## 2016-09-20 DIAGNOSIS — I10 Essential (primary) hypertension: Secondary | ICD-10-CM

## 2016-09-20 DIAGNOSIS — E66812 Obesity, class 2: Secondary | ICD-10-CM

## 2016-09-20 DIAGNOSIS — E669 Obesity, unspecified: Secondary | ICD-10-CM

## 2016-09-20 DIAGNOSIS — E7800 Pure hypercholesterolemia, unspecified: Secondary | ICD-10-CM | POA: Diagnosis not present

## 2016-09-20 DIAGNOSIS — K760 Fatty (change of) liver, not elsewhere classified: Secondary | ICD-10-CM | POA: Diagnosis not present

## 2016-09-20 LAB — BASIC METABOLIC PANEL
BUN: 22 mg/dL (ref 6–23)
CALCIUM: 9.7 mg/dL (ref 8.4–10.5)
CO2: 27 mEq/L (ref 19–32)
Chloride: 102 mEq/L (ref 96–112)
Creatinine, Ser: 1.06 mg/dL (ref 0.40–1.50)
GFR: 72.31 mL/min (ref >60.00)
GLUCOSE: 100 mg/dL — AB (ref 70–99)
Potassium: 4.4 mEq/L (ref 3.5–5.1)
Sodium: 139 mEq/L (ref 135–145)

## 2016-09-20 LAB — LIPID PANEL
Cholesterol: 142 mg/dL (ref 0–200)
HDL: 40 mg/dL (ref >39.00)
LDL Cholesterol: 69 mg/dL (ref 0–99)
NonHDL: 101.82
TRIGLYCERIDES: 162 mg/dL — AB (ref 0.0–149.0)
Total CHOL/HDL Ratio: 4
VLDL: 32.4 mg/dL (ref 0.0–40.0)

## 2016-09-20 MED ORDER — CICLOPIROX 0.77 % EX GEL: CUTANEOUS | 1 refills | Status: DC

## 2016-09-20 NOTE — Addendum Note (Signed)
Addended by: Ralph Dowdy on: 09/20/2016 08:29 AM   Modules accepted: Orders

## 2016-09-20 NOTE — Progress Notes (Signed)
Pre visit review using our clinic review tool, if applicable. No additional management support is needed unless otherwise documented below in the visit note. 

## 2016-09-25 ENCOUNTER — Telehealth: Payer: Self-pay | Admitting: Family Medicine

## 2016-09-25 MED ORDER — CICLOPIROX OLAMINE 0.77 % EX CREA: TOPICAL_CREAM | CUTANEOUS | 2 refills | Status: DC

## 2016-09-25 NOTE — Telephone Encounter (Signed)
Please advise. Thanks.  

## 2016-09-25 NOTE — Telephone Encounter (Signed)
Rep calling for [facility] about Ciclopirox 0.77 % gel because it's $345 copay for it. She requests that Dr. Anitra Lauth change the script to the cream version which will be $8 for the patient.   Thank you,  -LL

## 2016-09-25 NOTE — Telephone Encounter (Signed)
OK, ciclopirox 0.77% cream eRx'd to [facility] mail delivery.

## 2016-11-28 DIAGNOSIS — H2513 Age-related nuclear cataract, bilateral: Secondary | ICD-10-CM | POA: Diagnosis not present

## 2016-11-28 DIAGNOSIS — H35033 Hypertensive retinopathy, bilateral: Secondary | ICD-10-CM | POA: Diagnosis not present

## 2017-03-19 ENCOUNTER — Telehealth: Payer: Self-pay | Admitting: Family Medicine

## 2017-03-19 NOTE — Telephone Encounter (Signed)
Noted  

## 2017-03-19 NOTE — Telephone Encounter (Signed)
FYI

## 2017-03-19 NOTE — Telephone Encounter (Signed)
Patient's wife calling to notify pcp that she has noticed a drastic change in patients demeanor over the last 6 months.  She reports patient is sleeping more than usual, not remember things and seems distant.  He sits on the couch for periods of time staring.  She is not sure if he is depressed of if possible some of his medications are causing this.  She wonders if the statin he is taken has something to do with it.  Per wife, patient has always been a private person and has not shared anything with her regarding how he is feeling.  [place]ver, she wanted patient's pcp to know what has been going on.  Patient has an upcoming appt on Wednesday(9/12) she would like pcp to address these concerns during appt.  She is not requesting a call back but is available at [phone] if pcp has any questions.

## 2017-03-21 ENCOUNTER — Ambulatory Visit (INDEPENDENT_AMBULATORY_CARE_PROVIDER_SITE_OTHER): Payer: Medicare Other | Admitting: Family Medicine

## 2017-03-21 ENCOUNTER — Other Ambulatory Visit (INDEPENDENT_AMBULATORY_CARE_PROVIDER_SITE_OTHER): Payer: Medicare Other

## 2017-03-21 ENCOUNTER — Encounter: Payer: Self-pay | Admitting: Family Medicine

## 2017-03-21 VITALS — BP 113/68 | HR 68 | Temp 98.9°F | Resp 16 | Ht 67.25 in | Wt 237.0 lb

## 2017-03-21 DIAGNOSIS — E669 Obesity, unspecified: Secondary | ICD-10-CM | POA: Diagnosis not present

## 2017-03-21 DIAGNOSIS — Z23 Encounter for immunization: Secondary | ICD-10-CM

## 2017-03-21 DIAGNOSIS — I10 Essential (primary) hypertension: Secondary | ICD-10-CM

## 2017-03-21 DIAGNOSIS — E7800 Pure hypercholesterolemia, unspecified: Secondary | ICD-10-CM

## 2017-03-21 LAB — COMPREHENSIVE METABOLIC PANEL
ALBUMIN: 4.4 g/dL (ref 3.5–5.2)
ALK PHOS: 66 U/L (ref 39–117)
ALT: 42 U/L (ref 0–53)
AST: 26 U/L (ref 0–37)
BUN: 26 mg/dL — ABNORMAL HIGH (ref 6–23)
CALCIUM: 9.5 mg/dL (ref 8.4–10.5)
CO2: 24 mEq/L (ref 19–32)
Chloride: 103 mEq/L (ref 96–112)
Creatinine, Ser: 1.03 mg/dL (ref 0.40–1.50)
GFR: 74.64 mL/min (ref >60.00)
Glucose, Bld: 77 mg/dL (ref 70–99)
POTASSIUM: 4.2 meq/L (ref 3.5–5.1)
Sodium: 137 mEq/L (ref 135–145)
TOTAL PROTEIN: 7.2 g/dL (ref 6.0–8.3)
Total Bilirubin: 0.8 mg/dL (ref 0.2–1.2)

## 2017-03-21 NOTE — Patient Instructions (Signed)
Make a lab appointment at the front desk to get labs done at [facility].

## 2017-03-21 NOTE — Addendum Note (Signed)
Addended by: Gordy Councilman on: 03/21/2017 11:03 AM   Modules accepted: Orders

## 2017-03-21 NOTE — Progress Notes (Signed)
OFFICE VISIT  03/21/2017   CC:  Chief Complaint  Patient presents with  . Follow-up    RCI, pt is not fasting.    HPI:    Patient is a 76 y.o. Caucasian male who presents for 6 mo f/u HTN, HLD, and obesity.  BP: occ home monitoring shows bp <130/80.   HLD: tolerates statin fine, compliant. Exercise: none. Diet: not working on anything.  Not motivated to exercise or diet. Denies feeling down/depressed.  When asked about this today he says "I just don't want to travel out of state anymore".  Denies any desire to isolate, sleep more or less, crying spells, or irritability. He says he has just gotten a little lazy as he has aged.  No SI or HI.   DEnies significant anxiety or irritability.   Past Medical History:  Diagnosis Date  . Allergy   . Cancer ([place])    skin cancer  . Diverticulosis 2013   noted on colonoscopy  . Fatty liver 05/2012   u/s done for mild/persistent elevation of LFTs  . Hay fever    no issues now-? out grown  . Hearing impairment    11/2015 Audiology eval= not yet ready for hearing aids ([facility])  . History of basal cell carcinoma of skin   . HTN (hypertension)   . Hx of adenomatous colonic polyps 09/2011; 01/2015   Recall 01/2018  . Hyperlipidemia   . Lumbar spondylosis 04/2013   with grade I spondylolisthesis L4 on L5.  . Obesity   . 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 COLONOSCOPY     2008 w/polyps,  09-22-2011 w/polyps.  01/2015 tubular adenoma x 1.  Recall 01/2018.  Marland Kitchen COLONOSCOPY W/ POLYPECTOMY  2008; 09/22/11;01/2015   Tubular adenoma--recall 3 yrs per Dr. Louanne Belton at [facility].  Then switched to Dr. Hilarie Fredrickson with [facility].  Marland Kitchen POLYPECTOMY    . UMBILICAL HERNIA REPAIR  2011    Outpatient Medications Prior to Visit  Medication Sig Dispense Refill  . aspirin EC 81 MG tablet Take 81 mg by mouth  daily.    Marland Kitchen atorvastatin (LIPITOR) 20 MG tablet TAKE 1 TABLET EVERY DAY 90 tablet 3  . Ergocalciferol (VITAMIN D2) 2000 UNITS TABS Take 1 capsule by mouth daily.    Marland Kitchen losartan (COZAAR) 50 MG tablet Take 1 tablet (50 mg total) by mouth daily. 90 tablet 3  . ciclopirox (LOPROX) 0.77 % cream Apply to each great toenail twice per day 30 g 2  . Ciclopirox 0.77 % gel Apply to top of each great toenail twice daily 100 g 1   No facility-administered medications prior to visit.     No Known Allergies  ROS As per HPI  PE: Blood pressure 113/68, pulse 68, temperature 98.9 F (37.2 C), temperature source Oral, resp. rate 16, height 5' 7.25" (1.708 m), weight 237 lb (107.5 kg), SpO2 96 %. Body mass index is 36.84 kg/m.  Gen: Alert, well appearing.  Patient is oriented to person, place, time, and situation. AFFECT: pleasant, lucid thought and speech. CV: RRR, no m/r/g.   LUNGS: CTA bilat, nonlabored resps, good aeration in all lung fields. EXT: no clubbing or cyanosis.  1+ pitting edema in both LL's.  LABS:  Lab Results  Component Value Date   TSH 1.00 09/29/2013   Lab Results  Component Value  Date   WBC 9.4 04/14/2013   HGB 16.2 04/14/2013   HCT 46.8 04/14/2013   MCV 90.9 04/14/2013   PLT 161 04/14/2013   Lab Results  Component Value Date   CREATININE 1.06 09/20/2016   BUN 22 09/20/2016   NA 139 09/20/2016   K 4.4 09/20/2016   CL 102 09/20/2016   CO2 27 09/20/2016   Lab Results  Component Value Date   ALT 41 03/22/2016   AST 29 03/22/2016   ALKPHOS 68 03/22/2016   BILITOT 0.7 03/22/2016   Lab Results  Component Value Date   CHOL 142 09/20/2016   Lab Results  Component Value Date   HDL 40.00 09/20/2016   Lab Results  Component Value Date   LDLCALC 69 09/20/2016   Lab Results  Component Value Date   TRIG 162.0 (H) 09/20/2016   Lab Results  Component Value Date   CHOLHDL 4 09/20/2016   Lab Results  Component Value Date   PSA 0.75 10/14/2014   PSA 0.84  09/29/2013   PSA 0.88 09/27/2012    IMPRESSION AND PLAN:  1) HTN; The current medical regimen is effective;  continue present plan and medications. Lytes/cr today.  2) Hypercholesterolemia: needs to work harder on TLC.  Encouraged today. No motivation, but otherwise no sign of depression that may be contributing. Tolerating statin.  Check hepatic panel today.  3) Obesity, class 2.   Encouraged pt to start working on TLC.  4) Preventative health care: high dose flu vaccine given today.  An After Visit Summary was printed and given to the patient.  FOLLOW UP: Return in about 6 months (around 09/18/2017) for routine chronic illness f/u.  Signed:  Crissie Sickles, MD           03/21/2017

## 2017-06-14 ENCOUNTER — Other Ambulatory Visit: Payer: Self-pay

## 2017-07-04 ENCOUNTER — Other Ambulatory Visit: Payer: Self-pay

## 2017-07-04 ENCOUNTER — Telehealth: Payer: Self-pay | Admitting: Family Medicine

## 2017-07-04 MED ORDER — ATORVASTATIN CALCIUM 20 MG PO TABS: ORAL_TABLET | ORAL | 3 refills | Status: DC

## 2017-07-04 MED ORDER — LOSARTAN POTASSIUM 50 MG PO TABS: 50.0000 mg | ORAL_TABLET | Freq: Every day | ORAL | 3 refills | Status: DC

## 2017-07-04 NOTE — Telephone Encounter (Signed)
Copied from [place] [id number]. Topic: Quick Communication - See Telephone Encounter >> Jul 04, 2017 10:03 AM Cleaster Corin, NT wrote: CRM for notification. See Telephone encounter for:   07/04/17. Pt. Calling to get refill on Losartan 50 mg. And lipitor 60m   [id number]: [phone]: [phone]

## 2017-07-04 NOTE — Telephone Encounter (Signed)
LOV 09/128/18 with Dr. Anitra Lauth / Refill request for losartan and lipitor / Medications already refilled:   Disp Refills Start End  losartan (COZAAR) 50 MG tablet 90 tablet 3 07/04/2017   Take 1 tablet (50 mg total) by mouth daily. - Oral  atorvastatin (LIPITOR) 20 MG tablet 90 tablet 3 07/04/2017   TAKE 1 TABLET EVERY DAY

## 2017-07-09 ENCOUNTER — Emergency Department (HOSPITAL_BASED_OUTPATIENT_CLINIC_OR_DEPARTMENT_OTHER)
Admission: EM | Admit: 2017-07-09 | Discharge: 2017-07-09 | Disposition: A | Discharge status: Home / Self Care | Payer: Medicare Other | Attending: Emergency Medicine | Admitting: Emergency Medicine

## 2017-07-09 ENCOUNTER — Other Ambulatory Visit: Payer: Self-pay

## 2017-07-09 ENCOUNTER — Encounter (HOSPITAL_BASED_OUTPATIENT_CLINIC_OR_DEPARTMENT_OTHER): Payer: Self-pay | Admitting: *Deleted

## 2017-07-09 ENCOUNTER — Emergency Department (HOSPITAL_BASED_OUTPATIENT_CLINIC_OR_DEPARTMENT_OTHER): Payer: Medicare Other

## 2017-07-09 DIAGNOSIS — M7542 Impingement syndrome of left shoulder: Secondary | ICD-10-CM

## 2017-07-09 DIAGNOSIS — I10 Essential (primary) hypertension: Secondary | ICD-10-CM | POA: Insufficient documentation

## 2017-07-09 DIAGNOSIS — Z85828 Personal history of other malignant neoplasm of skin: Secondary | ICD-10-CM | POA: Diagnosis not present

## 2017-07-09 DIAGNOSIS — M79602 Pain in left arm: Secondary | ICD-10-CM | POA: Diagnosis present

## 2017-07-09 DIAGNOSIS — Z7982 Long term (current) use of aspirin: Secondary | ICD-10-CM | POA: Insufficient documentation

## 2017-07-09 DIAGNOSIS — R079 Chest pain, unspecified: Secondary | ICD-10-CM | POA: Diagnosis not present

## 2017-07-09 LAB — CBC WITH DIFFERENTIAL/PLATELET
Basophils Absolute: 0 10*3/uL (ref 0.0–0.1)
Basophils Relative: 0 %
EOS ABS: 0.2 10*3/uL (ref 0.0–0.7)
EOS PCT: 2 %
HCT: 46 % (ref 39.0–52.0)
HEMOGLOBIN: 15.4 g/dL (ref 13.0–17.0)
LYMPHS ABS: 1.7 10*3/uL (ref 0.7–4.0)
LYMPHS PCT: 15 %
MCH: 30.2 pg (ref 26.0–34.0)
MCHC: 33.5 g/dL (ref 30.0–36.0)
MCV: 90.2 fL (ref 78.0–100.0)
MONOS PCT: 7 %
Monocytes Absolute: 0.7 10*3/uL (ref 0.1–1.0)
Neutro Abs: 8.2 10*3/uL — ABNORMAL HIGH (ref 1.7–7.7)
Neutrophils Relative %: 76 %
Platelets: 157 10*3/uL (ref 150–400)
RBC: 5.1 MIL/uL (ref 4.22–5.81)
RDW: 13.1 % (ref 11.5–15.5)
WBC: 10.8 10*3/uL — ABNORMAL HIGH (ref 4.0–10.5)

## 2017-07-09 LAB — COMPREHENSIVE METABOLIC PANEL
ALBUMIN: 3.9 g/dL (ref 3.5–5.0)
ALT: 53 U/L (ref 17–63)
AST: 32 U/L (ref 15–41)
Alkaline Phosphatase: 77 U/L (ref 38–126)
Anion gap: 8 (ref 5–15)
BUN: 22 mg/dL — AB (ref 6–20)
CHLORIDE: 104 mmol/L (ref 101–111)
CO2: 26 mmol/L (ref 22–32)
CREATININE: 1.04 mg/dL (ref 0.61–1.24)
Calcium: 8.9 mg/dL (ref 8.9–10.3)
GFR calc Af Amer: >60 mL/min (ref >60)
GFR calc non Af Amer: >60 mL/min (ref >60)
Glucose, Bld: 138 mg/dL — ABNORMAL HIGH (ref 65–99)
POTASSIUM: 4.3 mmol/L (ref 3.5–5.1)
SODIUM: 138 mmol/L (ref 135–145)
Total Bilirubin: 0.5 mg/dL (ref 0.3–1.2)
Total Protein: 7.2 g/dL (ref 6.5–8.1)

## 2017-07-09 LAB — LIPASE, BLOOD: LIPASE: 37 U/L (ref 11–51)

## 2017-07-09 LAB — TROPONIN I

## 2017-07-09 MED ORDER — LIDOCAINE 5 % EX PTCH: 1.0000 | MEDICATED_PATCH | CUTANEOUS | 0 refills | Status: DC

## 2017-07-09 MED ORDER — METHOCARBAMOL 500 MG PO TABS
1000.0000 mg | ORAL_TABLET | Freq: Once | ORAL | Status: AC
  Administered 2017-07-09: 1000 mg via ORAL
  Filled 2017-07-09: qty 2

## 2017-07-09 MED ORDER — DEXAMETHASONE SODIUM PHOSPHATE 10 MG/ML IJ SOLN
10.0000 mg | Freq: Once | INTRAMUSCULAR | Status: AC
  Administered 2017-07-09: 10 mg via INTRAVENOUS
  Filled 2017-07-09: qty 1

## 2017-07-09 MED ORDER — KETOROLAC TROMETHAMINE 30 MG/ML IJ SOLN
15.0000 mg | Freq: Once | INTRAMUSCULAR | Status: AC
  Administered 2017-07-09: 15 mg via INTRAVENOUS
  Filled 2017-07-09: qty 1

## 2017-07-09 MED ORDER — DICLOFENAC SODIUM ER 100 MG PO TB24: 100.0000 mg | ORAL_TABLET | Freq: Every day | ORAL | 0 refills | Status: DC

## 2017-07-09 NOTE — ED Notes (Signed)
Alert, NAD, calm, interactive, resps e/u, speaking in clear complete sentences, no dyspnea noted, skin W&D, VSS, c/o L arm pain, pt related it "to the way he slept on the couch and positioning, but was concerned for a heart attack", took 2 baby ASA and ibuprofen 654m PTA, (denies: CP, back pain, other pain, sob, fever, cough, congestion, cold sx, NVD, diaphoresis, syncope, dizziness, weakness, fatigue, or visual changes).

## 2017-07-09 NOTE — ED Notes (Signed)
To xray by stretcher

## 2017-07-09 NOTE — ED Provider Notes (Signed)
[facility] EMERGENCY DEPARTMENT Provider Note   CSN: [id number] Arrival date & time: 07/09/17  4496     History   Chief Complaint Chief Complaint  Patient presents with  . Arm Pain    HPI Gregory Blevins is a 76 y.o. male.  The history is provided by the patient.  Arm Pain  This is a new problem. The current episode started 12 to 24 hours ago. The problem occurs constantly. The problem has not changed since onset.Pertinent negatives include no chest pain, no abdominal pain, no headaches and no shortness of breath. Nothing aggravates the symptoms. Nothing relieves the symptoms. He has tried acetaminophen for the symptoms. The treatment provided no relief.  Slept on left arm and woke up with left arm pain.  No DOE no chest pain.  No n/v/d.  No exertional symptoms.  No leg pain.    Past Medical History:  Diagnosis Date  . Allergy   . Cancer ([place])    skin cancer  . Diverticulosis 2013   noted on colonoscopy  . Fatty liver 05/2012   u/s done for mild/persistent elevation of LFTs  . Hay fever    no issues now-? out grown  . Hearing impairment    11/2015 Audiology eval= not yet ready for hearing aids ([facility])  . History of basal cell carcinoma of skin   . HTN (hypertension)   . Hx of adenomatous colonic polyps 09/2011; 01/2015   Recall 01/2018  . Hyperlipidemia   . Lumbar spondylosis 04/2013   with grade I spondylolisthesis L4 on L5.  . Obesity   . Osteoarthritis    Hips (L>R on x-ray 04/2013), shoulders, and knees (hx of adhesive capsulitis of shoulder)    Patient Active Problem List   Diagnosis Date Noted  . Prostate cancer screening 10/21/2015  . Preventative health care 04/10/2014  . 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 Pelvic pain in male 03/21/2013  . Health maintenance examination 09/27/2012  . HTN (hypertension), benign 09/27/2012    Past Surgical History:  Procedure Laterality Date  .  COLONOSCOPY     2008 w/polyps,  09-22-2011 w/polyps.  01/2015 tubular adenoma x 1.  Recall 01/2018.  Marland Kitchen COLONOSCOPY W/ POLYPECTOMY  2008; 09/22/11;01/2015   Tubular adenoma--recall 3 yrs per Dr. Louanne [facility] at [facility].  Then switched to Dr. Hilarie Fredrickson with [facility].  Marland Kitchen POLYPECTOMY    . UMBILICAL HERNIA REPAIR  2011       Home Medications    Prior to Admission medications   Medication Sig Start Date End Date Taking? Authorizing Provider  aspirin EC 81 MG tablet Take 81 mg by mouth daily.    [provider]  atorvastatin (LIPITOR) 20 MG tablet TAKE 1 TABLET EVERY DAY 07/04/17   [name], [name], MD  ciclopirox (LOPROX) 0.77 % cream Apply to each great toenail twice per day 09/25/16   Tammi Sou, MD  Ergocalciferol (VITAMIN D2) 2000 UNITS TABS Take 1 capsule by mouth daily.    [provider]  losartan (COZAAR) 50 MG tablet Take 1 tablet (50 mg total) by mouth daily. 07/04/17   [name], [name], MD    Family History Family History  Problem Relation Age of Onset  . Hypertension Father   . Colon cancer Father   . Alcoholism Father   . Stomach cancer Father   . Colon polyps Neg Hx   . Esophageal cancer Neg Hx   .  Rectal cancer Neg Hx     Social History Social History   Tobacco Use  . Smoking status: Never Smoker  . Smokeless tobacco: Never Used  Substance Use Topics  . Alcohol use: Yes    Comment: occassional  . Drug use: No     Allergies   Patient has no known allergies.   Review of Systems Review of Systems  Constitutional: Negative for diaphoresis.  Respiratory: Negative for chest tightness and shortness of breath.   Cardiovascular: Negative for chest pain, palpitations and leg swelling.  Gastrointestinal: Negative for abdominal pain and nausea.  Musculoskeletal: Positive for arthralgias. Negative for back pain, joint swelling, myalgias, neck pain and neck stiffness.  Neurological: Negative for headaches.  All other systems reviewed and  are negative.    Physical Exam Updated Vital Signs BP (!) 155/77   Pulse 76   Temp 98 F (36.7 C) (Oral)   Resp 18   Ht 5' 8"  (1.727 m)   Wt 108 kg (238 lb)   SpO2 95%   BMI 36.19 kg/m   Physical Exam  Constitutional: He is oriented to person, place, and time. He appears well-developed and well-nourished. No distress.  HENT:  Head: Normocephalic and atraumatic.  Right Ear: External ear normal.  Left Ear: External ear normal.  Mouth/Throat: No oropharyngeal exudate.  Eyes: Conjunctivae are normal. Pupils are equal, round, and reactive to light.  Neck: Normal range of motion. Neck supple. No tracheal deviation present.  Cardiovascular: Normal rate, regular rhythm, normal heart sounds and intact distal pulses.  Pulmonary/Chest: Effort normal and breath sounds normal. No stridor. He has no wheezes. He has no rales.  Abdominal: Soft. Bowel sounds are normal. He exhibits no mass. There is no tenderness. There is no rebound and no guarding.  Lymphadenopathy:    He has no cervical adenopathy.  Neurological: He is alert and oriented to person, place, and time.  Skin: Skin is warm and dry. Capillary refill takes less than 2 seconds.     ED Treatments / Results  Labs (all labs ordered are listed, but only abnormal results are displayed)  Results for orders placed or performed during the hospital encounter of 07/09/17  Troponin I  Result Value Ref Range   Troponin I <0.03 <0.03 ng/mL  Comprehensive metabolic panel  Result Value Ref Range   Sodium 138 135 - 145 mmol/L   Potassium 4.3 3.5 - 5.1 mmol/L   Chloride 104 101 - 111 mmol/L   CO2 26 22 - 32 mmol/L   Glucose, Bld 138 (H) 65 - 99 mg/dL   BUN 22 (H) 6 - 20 mg/dL   Creatinine, Ser 1.04 0.61 - 1.24 mg/dL   Calcium 8.9 8.9 - 10.3 mg/dL   Total Protein 7.2 6.5 - 8.1 g/dL   Albumin 3.9 3.5 - 5.0 g/dL   AST 32 15 - 41 U/L   ALT 53 17 - 63 U/L   Alkaline Phosphatase 77 38 - 126 U/L   Total Bilirubin 0.5 0.3 - 1.2 mg/dL    GFR calc non Af Amer >60 >60 mL/min   GFR calc Af Amer >60 >60 mL/min   Anion gap 8 5 - 15  CBC with Differential  Result Value Ref Range   WBC 10.8 (H) 4.0 - 10.5 K/uL   RBC 5.10 4.22 - 5.81 MIL/uL   Hemoglobin 15.4 13.0 - 17.0 g/dL   HCT 46.0 39.0 - 52.0 %   MCV 90.2 78.0 - 100.0 fL   MCH 30.2  26.0 - 34.0 pg   MCHC 33.5 30.0 - 36.0 g/dL   RDW 13.1 11.5 - 15.5 %   Platelets 157 150 - 400 K/uL   Neutrophils Relative % 76 %   Neutro Abs 8.2 (H) 1.7 - 7.7 K/uL   Lymphocytes Relative 15 %   Lymphs Abs 1.7 0.7 - 4.0 K/uL   Monocytes Relative 7 %   Monocytes Absolute 0.7 0.1 - 1.0 K/uL   Eosinophils Relative 2 %   Eosinophils Absolute 0.2 0.0 - 0.7 K/uL   Basophils Relative 0 %   Basophils Absolute 0.0 0.0 - 0.1 K/uL  Lipase, blood  Result Value Ref Range   Lipase 37 11 - 51 U/L   Dg Chest 2 View  Result Date: 07/09/2017 CLINICAL DATA:  Acute onset of left shoulder pain. Initial encounter. EXAM: CHEST  2 VIEW COMPARISON:  Chest radiograph performed 07/26/2009 FINDINGS: The lungs are well-aerated and clear. There is no evidence of focal opacification, pleural effusion or pneumothorax. The heart is normal in size; the mediastinal contour is within normal limits. No acute osseous abnormalities are seen. Degenerative change is noted at the glenohumeral joints bilaterally, with sclerosis and osteophyte formation. Anterior bridging osteophytes are seen along the thoracic and upper lumbar spine. IMPRESSION: 1. No acute cardiopulmonary process seen. 2. Degenerative change at the glenohumeral joints bilaterally, with sclerosis and osteophyte formation. Electronically Signed   By: Garald Balding M.D.   On: 07/09/2017 04:56    EKG  EKG Interpretation  Date/Time:  Monday July 09 2017 03:50:04 EST Ventricular Rate:  81 PR Interval:    QRS Duration: 79 QT Interval:  367 QTC Calculation: 426 R Axis:   42 Text Interpretation:  normal sinus rhythm Confirmed by Randal Buba, Lanea Vankirk ([id number]) on  07/09/2017 4:15:19 AM        Procedures Procedures (including critical care time)  Medications Ordered in ED  Medications  ketorolac (TORADOL) 30 MG/ML injection 15 mg (15 mg Intravenous Given 07/09/17 0446)  dexamethasone (DECADRON) injection 10 mg (10 mg Intravenous Given 07/09/17 0445)  methocarbamol (ROBAXIN) tablet 1,000 mg (1,000 mg Oral Given 07/09/17 0447)     Final Clinical Impressions(s) / ED Diagnoses    MSK pain:  Will treat for same.  Don't sleep on that arm.  Return for worsening pain,  fevers > 100.4 unrelieved by medication, weakness or numbness,  intractable vomiting, or diarrhea, abdominal pain, Inability to tolerate liquids or food, cough, altered mental status or any concerns. No signs of systemic illness or infection. The patient is nontoxic-appearing on exam and vital signs are within normal limits.    I have reviewed the triage vital signs and the nursing notes. Pertinent labs &imaging results that were available during my care of the patient were reviewed by me and considered in my medical decision making (see chart for details).  After history, exam, and medical workup I feel the patient has been appropriately medically screened and is safe for discharge home. Pertinent diagnoses were discussed with the patient. Patient was given return precautions      River Mckercher, MD 07/09/17 [phone]

## 2017-07-09 NOTE — ED Notes (Signed)
EDP back into room to update pt on results and plan.

## 2017-07-09 NOTE — ED Notes (Signed)
EDP into room, at BS.  ?

## 2017-08-30 ENCOUNTER — Encounter: Payer: Self-pay | Admitting: Family Medicine

## 2017-08-30 ENCOUNTER — Ambulatory Visit (INDEPENDENT_AMBULATORY_CARE_PROVIDER_SITE_OTHER): Payer: Medicare Other | Admitting: Family Medicine

## 2017-08-30 VITALS — BP 143/82 | HR 79 | Temp 98.3°F | Resp 16 | Ht 68.0 in | Wt 239.0 lb

## 2017-08-30 DIAGNOSIS — L247 Irritant contact dermatitis due to plants, except food: Secondary | ICD-10-CM | POA: Diagnosis not present

## 2017-08-30 MED ORDER — FLUTICASONE PROPIONATE 0.05 % EX CREA: TOPICAL_CREAM | Freq: Two times a day (BID) | CUTANEOUS | 1 refills | Status: DC

## 2017-08-30 NOTE — Progress Notes (Signed)
OFFICE VISIT  08/30/2017   CC:  Chief Complaint  Patient presents with  . Rash   HPI:    Patient is a 77 y.o. Caucasian male who presents for rash. About 1 week ago, he cut a tree recently in yard, one of the branches scraped his L inner forearm and made an abrasion. Abrasion has healed but itchy rash started around the side and has spread over inner aspect of forearm, L chest wall, and L upper abd.  Rash is improved since it started.  A few spots on R arm, L groin and thigh---says he thinks some of the areas are from him scratching the areas a lot.  No vesicles, no pain.    Past Medical History:  Diagnosis Date  . Allergy   . Cancer ([place])    skin cancer  . Diverticulosis 2013   noted on colonoscopy  . Fatty liver 05/2012   u/s done for mild/persistent elevation of LFTs  . Hay fever    no issues now-? out grown  . Hearing impairment    11/2015 Audiology eval= not yet ready for hearing aids ([facility])  . History of basal cell carcinoma of skin   . HTN (hypertension)   . Hx of adenomatous colonic polyps 09/2011; 01/2015   Recall 01/2018  . Hyperlipidemia   . Lumbar spondylosis 04/2013   with grade I spondylolisthesis L4 on L5.  . Obesity   . 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 COLONOSCOPY     2008 w/polyps,  09-22-2011 w/polyps.  01/2015 tubular adenoma x 1.  Recall 01/2018.  Marland Kitchen COLONOSCOPY W/ POLYPECTOMY  2008; 09/22/11;01/2015   Tubular adenoma--recall 3 yrs per Dr. Louanne Belton at [facility].  Then switched to Dr. Hilarie Fredrickson with [facility].  Marland Kitchen POLYPECTOMY    . UMBILICAL HERNIA REPAIR  2011    Outpatient Medications Prior to Visit  Medication Sig Dispense Refill  . aspirin EC 81 MG tablet Take 81 mg by mouth daily.    Marland Kitchen atorvastatin (LIPITOR) 20 MG tablet TAKE 1 TABLET EVERY DAY 90 tablet 3  . ciclopirox (LOPROX) 0.77 % cream Apply  to each great toenail twice per day 30 g 2  . Ergocalciferol (VITAMIN D2) 2000 UNITS TABS Take 1 capsule by mouth daily.    Marland Kitchen lidocaine (LIDODERM) 5 % Place 1 patch onto the skin daily. Remove & Discard patch within 12 hours or as directed by MD 10 patch 0  . losartan (COZAAR) 50 MG tablet Take 1 tablet (50 mg total) by mouth daily. 90 tablet 3  . Diclofenac Sodium CR (VOLTAREN-XR) 100 MG 24 hr tablet Take 1 tablet (100 mg total) by mouth daily. (Patient not taking: Reported on 08/30/2017) 7 tablet 0   No facility-administered medications prior to visit.     No Known Allergies  ROS As per HPI  PE: Blood pressure (!) 143/82, pulse 79, temperature 98.3 F (36.8 C), temperature source Oral, resp. rate 16, weight 239 lb (108.4 kg), SpO2 96 %. Gen: Alert, well appearing.  Patient is oriented to person, place, time, and situation. AFFECT: pleasant, lucid thought and speech. SKIN: left arm inner aspect from distal biceps down to wrist has coalesced macular rash that is a bit indurated and superficially flaky, +blanchable.  Similar rash on L chest wall and L upper abd region.  A few  similar spots on R arm and umbilicus that are about 2-4 cm size.  No vesicles or pustules or papules.  No tenderness, warmth, or streaking.  LABS:    Chemistry      Component Value Date/Time   NA 138 07/09/2017 0348   K 4.3 07/09/2017 0348   CL 104 07/09/2017 0348   CO2 26 07/09/2017 0348   BUN 22 (H) 07/09/2017 0348   CREATININE 1.04 07/09/2017 0348      Component Value Date/Time   CALCIUM 8.9 07/09/2017 0348   ALKPHOS 77 07/09/2017 0348   AST 32 07/09/2017 0348   ALT 53 07/09/2017 0348   BILITOT 0.5 07/09/2017 0348      IMPRESSION AND PLAN:  Contact dermatitis due to plant/tree. Improving some with otc hydrocortisone ointment and calamine. I rx'd cutivate 0.05% cream to apply to affected areas bid. May continue calamine. Signs/symptoms to call or return for were reviewed and pt expressed  understanding. If not resolving in reasonable amount of time OR if worsening, will give systemic steroids.  An After Visit Summary was printed and given to the patient.   FOLLOW UP: Return if symptoms worsen or fail to improve.  Signed:  Crissie Sickles, MD           08/30/2017

## 2017-09-07 DIAGNOSIS — R3129 Other microscopic hematuria: Secondary | ICD-10-CM

## 2017-09-07 HISTORY — DX: Other microscopic hematuria: R31.29

## 2017-09-18 ENCOUNTER — Other Ambulatory Visit (INDEPENDENT_AMBULATORY_CARE_PROVIDER_SITE_OTHER): Payer: Medicare Other

## 2017-09-18 ENCOUNTER — Encounter: Payer: Self-pay | Admitting: Family Medicine

## 2017-09-18 ENCOUNTER — Ambulatory Visit (INDEPENDENT_AMBULATORY_CARE_PROVIDER_SITE_OTHER): Payer: Medicare Other | Admitting: Family Medicine

## 2017-09-18 VITALS — BP 128/70 | HR 65 | Temp 98.5°F | Resp 16 | Ht 68.0 in | Wt 241.2 lb

## 2017-09-18 DIAGNOSIS — Z125 Encounter for screening for malignant neoplasm of prostate: Secondary | ICD-10-CM | POA: Diagnosis not present

## 2017-09-18 DIAGNOSIS — I10 Essential (primary) hypertension: Secondary | ICD-10-CM | POA: Diagnosis not present

## 2017-09-18 DIAGNOSIS — R358 Other polyuria: Secondary | ICD-10-CM

## 2017-09-18 DIAGNOSIS — E7800 Pure hypercholesterolemia, unspecified: Secondary | ICD-10-CM

## 2017-09-18 DIAGNOSIS — R3129 Other microscopic hematuria: Secondary | ICD-10-CM | POA: Diagnosis not present

## 2017-09-18 DIAGNOSIS — R3589 Other polyuria: Secondary | ICD-10-CM

## 2017-09-18 LAB — URINALYSIS, ROUTINE W REFLEX MICROSCOPIC
Bilirubin Urine: NEGATIVE
KETONES UR: NEGATIVE
Leukocytes, UA: NEGATIVE
Nitrite: NEGATIVE
PH: 5 (ref 5.0–8.0)
SPECIFIC GRAVITY, URINE: 1.025 (ref 1.000–1.030)
Total Protein, Urine: NEGATIVE
UROBILINOGEN UA: 0.2 (ref 0.0–1.0)
Urine Glucose: NEGATIVE

## 2017-09-18 LAB — POCT URINALYSIS DIPSTICK
Bilirubin, UA: NEGATIVE
Glucose, UA: NEGATIVE
Ketones, UA: NEGATIVE
LEUKOCYTES UA: NEGATIVE
NITRITE UA: NEGATIVE
PH UA: 5.5 (ref 5.0–8.0)
Protein, UA: 15
SPEC GRAV UA: 1.025 (ref 1.010–1.025)
UROBILINOGEN UA: 0.2 U/dL

## 2017-09-18 MED ORDER — OXYBUTYNIN CHLORIDE ER 5 MG PO TB24: ORAL_TABLET | ORAL | 0 refills | Status: DC

## 2017-09-18 NOTE — Patient Instructions (Signed)
Take one oxybutynin 66m tab every morning for 5 days.  If no change in urination problem, increase to TWO tabs every morning.

## 2017-09-18 NOTE — Progress Notes (Signed)
OFFICE VISIT  09/19/2017   CC:  Chief Complaint  Patient presents with  . Follow-up    RCI, pt is not fasting.    HPI:    Patient is a 77 y.o. Caucasian male who presents for 6 mo f/u HTN, hypercholesterolemia, and obesity. HTN: bp's normal at home.  Taking cholesterol med daily. He does not attempt to eat a very healthy diet nor does he do any exercise.  In fact, he is pretty sedentary.  About 6-8 mo hx as been feeling need to urinate a lot during the day, no signif nocturia at all.  No dysuria.  Stream starts fine and is normal flow.   Occ urgency but not usually.  A good amount does come out each time and he has no sensation of incomplete emptying.  No dribbling.  No incontinence but he admits that he feels like he would lose his urine if he put off urination very long. Does not feel this problem when sitting (such as when driving).  Upon standing up he feels like he has to start urinating again. Denies excessive thirst.  Urine color is light yellow.  About 6-8 mo of "soiling" underwear more than he has in the past.   When he passes gas, sometimes he has a bit of BM come out.  No loose stools.   No signif weakness in legs over this time period.  No saddle anesthesia.  No low back pain.   No signif wt gain: up only 6 lbs over the last 1 yr.   Past Medical History:  Diagnosis Date  . Allergy   . Cancer ([place])    skin cancer  . Diverticulosis 2013   noted on colonoscopy  . Fatty liver 05/2012   u/s done for mild/persistent elevation of LFTs  . Hay fever    no issues now-? out grown  . Hearing impairment    11/2015 Audiology eval= not yet ready for hearing aids ([facility])  . History of basal cell carcinoma of skin   . HTN (hypertension)   . Hx of adenomatous colonic polyps 09/2011; 01/2015   Recall 01/2018  . Hyperlipidemia   . Lumbar spondylosis 04/2013   with grade I spondylolisthesis L4 on L5.  . Obesity   . Osteoarthritis    Hips (L>R on x-ray  04/2013), shoulders, and knees (hx of adhesive capsulitis of shoulder)    Past Surgical History:  Procedure Laterality Date  . COLONOSCOPY     2008 w/polyps,  09-22-2011 w/polyps.  01/2015 tubular adenoma x 1.  Recall 01/2018.  Marland Kitchen COLONOSCOPY W/ POLYPECTOMY  2008; 09/22/11;01/2015   Tubular adenoma--recall 3 yrs per Dr. Louanne [facility] at [facility].  Then switched to Dr. Hilarie Fredrickson with [facility].  Marland Kitchen POLYPECTOMY    . UMBILICAL HERNIA REPAIR  2011    Outpatient Medications Prior to Visit  Medication Sig Dispense Refill  . aspirin EC 81 MG tablet Take 81 mg by mouth daily.    Marland Kitchen atorvastatin (LIPITOR) 20 MG tablet TAKE 1 TABLET EVERY DAY 90 tablet 3  . ciclopirox (LOPROX) 0.77 % cream Apply to each great toenail twice per day 30 g 2  . Ergocalciferol (VITAMIN D2) 2000 UNITS TABS Take 1 capsule by mouth daily.    Marland Kitchen losartan (COZAAR) 50 MG tablet Take 1 tablet (50 mg total) by mouth daily. 90 tablet 3  . Diclofenac Sodium CR (VOLTAREN-XR) 100 MG 24 hr tablet Take 1 tablet (100 mg total) by mouth daily. (  Patient not taking: Reported on 08/30/2017) 7 tablet 0  . fluticasone (CUTIVATE) 0.05 % cream Apply topically 2 (two) times daily. (Patient not taking: Reported on 09/18/2017) 30 g 1  . lidocaine (LIDODERM) 5 % Place 1 patch onto the skin daily. Remove & Discard patch within 12 hours or as directed by MD (Patient not taking: Reported on 09/18/2017) 10 patch 0   No facility-administered medications prior to visit.     No Known Allergies  ROS As per HPI  PE: Blood pressure 128/70, pulse 65, temperature 98.5 F (36.9 C), temperature source Oral, resp. rate 16, height 5' 8"  (1.727 m), weight 241 lb 4 oz (109.4 kg), SpO2 97 %. Body mass index is 36.68 kg/m.  Gen: Alert, well appearing.  Patient is oriented to person, place, time, and situation. AFFECT: pleasant, lucid thought and speech. CV: RRR, no m/r/g.   LUNGS: CTA bilat, nonlabored resps, good aeration in all lung fields. EXT: no clubbing,  cyanosis, or edema.  Neuro: CN 2-12 intact bilaterally, strength 5/5 in proximal and distal upper extremities and lower extremities bilaterally.   No tremor.  No ataxia.  Upper extremity and lower extremity DTRs symmetric.  No pronator drift. Rectal exam: tone normal.  Light yellow stool visible around anal verge.  No hemorrhoids or masses.  I can feel a symmetrically large prostate with no tenderness or nodularity.   LABS:  Lab Results  Component Value Date   TSH 1.00 09/29/2013   Lab Results  Component Value Date   WBC 10.8 (H) 07/09/2017   HGB 15.4 07/09/2017   HCT 46.0 07/09/2017   MCV 90.2 07/09/2017   PLT 157 07/09/2017   Lab Results  Component Value Date   CREATININE 1.04 07/09/2017   BUN 22 (H) 07/09/2017   NA 138 07/09/2017   K 4.3 07/09/2017   CL 104 07/09/2017   CO2 26 07/09/2017   Lab Results  Component Value Date   ALT 53 07/09/2017   AST 32 07/09/2017   ALKPHOS 77 07/09/2017   BILITOT 0.5 07/09/2017   Lab Results  Component Value Date   CHOL 142 09/20/2016   Lab Results  Component Value Date   HDL 40.00 09/20/2016   Lab Results  Component Value Date   LDLCALC 69 09/20/2016   Lab Results  Component Value Date   TRIG 162.0 (H) 09/20/2016   Lab Results  Component Value Date   CHOLHDL 4 09/20/2016   Lab Results  Component Value Date   PSA 0.75 10/14/2014   PSA 0.84 09/29/2013   PSA 0.88 09/27/2012   No results found for: HGBA1C  CC UA today: 1+ blood, SG 1.030, 15 mg/dl prot, otherwise NORMAL.  IMPRESSION AND PLAN:  1) HTN; The current medical regimen is effective;  continue present plan and medications. Lytes/cr today.   2) HLD: tolerating statin.   Encouraged better compliance with diet/exercise.  3) Obesity:  Encouraged better compliance with diet/exercise.  4) Polyuria, question-->? OAB sx's---urine today sent to lab for micro to f/u the 1+ blood on dipstick today, but unsure if specimen will be large enough. Reassured  regarding increased soilage of underwear lately.  Watchful waiting at this time. BMET, PSA.  An After Visit Summary was printed and given to the patient.  FOLLOW UP: Return in about 2 weeks (around 10/02/2017) for f/u urinary/bladder sx's.  Signed:  Crissie Sickles, MD           09/19/2017

## 2017-09-19 LAB — BASIC METABOLIC PANEL
BUN: 22 mg/dL (ref 6–23)
CO2: 26 meq/L (ref 19–32)
Calcium: 9.4 mg/dL (ref 8.4–10.5)
Chloride: 103 mEq/L (ref 96–112)
Creatinine, Ser: 0.96 mg/dL (ref 0.40–1.50)
GFR: 80.85 mL/min (ref >60.00)
GLUCOSE: 131 mg/dL — AB (ref 70–99)
POTASSIUM: 4.1 meq/L (ref 3.5–5.1)
SODIUM: 138 meq/L (ref 135–145)

## 2017-09-19 LAB — PSA: PSA: 0.9 ng/mL (ref 0.10–4.00)

## 2017-09-27 ENCOUNTER — Encounter: Payer: Self-pay | Admitting: Family Medicine

## 2017-10-03 ENCOUNTER — Ambulatory Visit (INDEPENDENT_AMBULATORY_CARE_PROVIDER_SITE_OTHER): Payer: Medicare Other | Admitting: Family Medicine

## 2017-10-03 ENCOUNTER — Encounter: Payer: Self-pay | Admitting: Family Medicine

## 2017-10-03 VITALS — BP 126/64 | HR 59 | Temp 97.8°F | Resp 16 | Ht 68.0 in | Wt 241.0 lb

## 2017-10-03 DIAGNOSIS — R358 Other polyuria: Secondary | ICD-10-CM

## 2017-10-03 DIAGNOSIS — N3281 Overactive bladder: Secondary | ICD-10-CM

## 2017-10-03 DIAGNOSIS — R3129 Other microscopic hematuria: Secondary | ICD-10-CM | POA: Diagnosis not present

## 2017-10-03 DIAGNOSIS — R3589 Other polyuria: Secondary | ICD-10-CM

## 2017-10-03 LAB — POC URINALSYSI DIPSTICK (AUTOMATED)
BILIRUBIN UA: NEGATIVE
Glucose, UA: NEGATIVE
KETONES UA: NEGATIVE
Leukocytes, UA: NEGATIVE
Nitrite, UA: NEGATIVE
PH UA: 6 (ref 5.0–8.0)
Protein, UA: NEGATIVE
SPEC GRAV UA: 1.015 (ref 1.010–1.025)
Urobilinogen, UA: 0.2 E.U./dL

## 2017-10-03 LAB — URINALYSIS, ROUTINE W REFLEX MICROSCOPIC
Bilirubin Urine: NEGATIVE
Ketones, ur: NEGATIVE
LEUKOCYTES UA: NEGATIVE
Nitrite: NEGATIVE
Specific Gravity, Urine: 1.015 (ref 1.000–1.030)
TOTAL PROTEIN, URINE-UPE24: NEGATIVE
Urine Glucose: NEGATIVE
Urobilinogen, UA: 0.2 (ref 0.0–1.0)
WBC, UA: NONE SEEN (ref 0–?)
pH: 5.5 (ref 5.0–8.0)

## 2017-10-03 MED ORDER — OXYBUTYNIN CHLORIDE ER 10 MG PO TB24: ORAL_TABLET | ORAL | 3 refills | Status: DC

## 2017-10-03 NOTE — Progress Notes (Signed)
OFFICE VISIT  10/03/2017   CC:  Chief Complaint  Patient presents with  . Follow-up    urinary symptoms   HPI:    Patient is a 77 y.o. Caucasian male who presents for 2 week f/u daytime urinary frequency, with microscopic hematuria on UA last visit (O/w normal).  I started him on oxybutynin xl 76m qd, which he has titrated up to 2 tabs qd. Feeling much improved.  Not having to urinate nearly as frequent urination in daytime.  No dysuria.  No gross hematuria. Denies feeling any side effects from the med.   Past Medical History:  Diagnosis Date  . Allergy   . [place]    [place]  . Diverticulosis 2013   noted on colonoscopy  . Fatty liver 05/2012   u/s done for mild/persistent elevation of LFTs  . Hay fever    no issues now-? out grown  . Hearing impairment    11/2015 Audiology eval= not yet ready for hearing aids ([facility])  . History of basal cell carcinoma of skin   . HTN (hypertension)   . Hx of adenomatous colonic polyps 09/2011; 01/2015   Recall 01/2018  . Hyperlipidemia   . Lumbar spondylosis 04/2013   with grade I spondylolisthesis L4 on L5.  . Microhematuria 09/2017   Plan repeat UA with micro 10/03/17  . Obesity   . 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 COLONOSCOPY     2008 w/polyps,  09-22-2011 w/polyps.  01/2015 tubular adenoma x 1.  Recall 01/2018.  .Marland KitchenCOLONOSCOPY W/ POLYPECTOMY  2008; 09/22/11;01/2015   Tubular adenoma--recall 3 yrs [facility]  Then switched to Dr. [facility] Fredricksonwith [facility].  .Marland KitchenPOLYPECTOMY    . UMBILICAL HERNIA REPAIR  2011    Outpatient Medications Prior to Visit  Medication Sig Dispense Refill  . aspirin EC 81 MG tablet Take 81 mg by mouth daily.    .Marland Kitchenatorvastatin (LIPITOR) 20 MG tablet TAKE 1 TABLET EVERY DAY 90 tablet 3  . ciclopirox (LOPROX) 0.77 % cream Apply to  each great toenail twice per day 30 g 2  . Ergocalciferol (VITAMIN D2) 2000 UNITS TABS Take 1 capsule by mouth daily.    .Marland Kitchenlosartan (COZAAR) 50 MG tablet Take 1 tablet (50 mg total) by mouth daily. 90 tablet 3  . oxybutynin (DITROPAN-XL) 5 MG 24 hr tablet 1-2 tabs po qAM 30 tablet 0   No facility-administered medications prior to visit.     No Known Allergies  ROS As per HPI  PE: Blood pressure 126/64, pulse (!) 59, temperature 97.8 F (36.6 C), temperature source Oral, resp. rate 16, height 5' 8"  (1.727 m), weight 241 lb (109.3 kg), SpO2 100 %. Gen: Alert, well appearing.  Patient is oriented to person, place, time, and situation. AFFECT: pleasant, lucid thought and speech. No further exam today.  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 Not having any side effects.  2) Microscopic hematuria: + on microscopy last visit. Since dipstick pos again today will send for microscopy again.  If 3-5 RBC/s/HPF again then will refer to urology for further eval. If none on microscopy today, will repeat UA [id number] was discussed with pt today and he is in agreement with plan.  An After Visit Summary was printed and given to the patient.  FOLLOW UP: Return in about 6 months (around 04/05/2018) for annual CPE (fasting).  Signed:  PhCrissie SicklesMD           10/03/2017

## 2017-12-04 ENCOUNTER — Telehealth: Payer: Self-pay | Admitting: Family Medicine

## 2017-12-04 MED ORDER — LISINOPRIL 20 MG PO TABS: 20.0000 mg | ORAL_TABLET | Freq: Every day | ORAL | 3 refills | Status: DC

## 2017-12-04 NOTE — Telephone Encounter (Signed)
Copied from [place] [phone]. Topic: Quick Communication - See Telephone Encounter >> Dec 04, 2017  8:41 AM Arletha Grippe wrote: CRM for notification. See Telephone encounter for: 12/04/17. Pt was told the the provider wants to change losartan to something else.  Provider told him this at last visit.  Pharm is [facility] order .  Cb of pt [phone]

## 2017-12-04 NOTE — Telephone Encounter (Signed)
Please advise. Thanks.  

## 2017-12-04 NOTE — Telephone Encounter (Signed)
Pt advised and voiced understanding.   

## 2017-12-04 NOTE — Telephone Encounter (Signed)
OK, I eRx'd lisinopril to his mail order pharmacy. Losartan has been taken off of his med list.-thx

## 2017-12-30 ENCOUNTER — Encounter: Payer: Self-pay | Admitting: Internal Medicine

## 2018-01-03 ENCOUNTER — Encounter: Payer: Self-pay | Admitting: Internal Medicine

## 2018-01-28 DIAGNOSIS — H2513 Age-related nuclear cataract, bilateral: Secondary | ICD-10-CM | POA: Diagnosis not present

## 2018-01-28 DIAGNOSIS — H35033 Hypertensive retinopathy, bilateral: Secondary | ICD-10-CM | POA: Diagnosis not present

## 2018-02-12 DIAGNOSIS — L570 Actinic keratosis: Secondary | ICD-10-CM | POA: Diagnosis not present

## 2018-03-12 ENCOUNTER — Ambulatory Visit (AMBULATORY_SURGERY_CENTER): Payer: Self-pay

## 2018-03-12 VITALS — Ht 68.0 in | Wt 232.4 lb

## 2018-03-12 DIAGNOSIS — Z86010 Personal history of colonic polyps: Secondary | ICD-10-CM

## 2018-03-12 NOTE — Progress Notes (Signed)
No egg or soy allergy known to patient  No issues with past sedation with any surgeries  or procedures, no intubation problems  No diet pills per patient No home 02 use per patient  No blood thinners per patient  Pt denies issues with constipation  No A fib or A flutter  EMMI video sent to pt's e mail. Pt declined   Pt requested to change appt.to an earlty morning procedure appt. Pyrtle had no appt. Avail for early throug Nov. 1/. Pt will call next week to reschedule. Pt aware will need another PV.

## 2018-03-19 ENCOUNTER — Encounter: Payer: Medicare Other | Admitting: Internal Medicine

## 2018-04-08 NOTE — Progress Notes (Signed)
Subjective:   Gregory Blevins is a 77 y.o. male who presents for Medicare Annual/Subsequent preventive examination.  Review of Systems:  No ROS.  Medicare Wellness Visit. Additional risk factors are reflected in the social history.  Cardiac Risk Factors include: advanced age (>61mn, >>59women);dyslipidemia;male gender;hypertension;obesity (BMI >30kg/m2);sedentary lifestyle   Sleep patterns: Sleeps 6 hours, naps occasionally.  Home Safety/Smoke Alarms: Feels safe in home. Smoke alarms in place.  Living environment; residence and Firearm Safety: Lives with wife in 1 story home.  Seat Belt Safety/Bike Helmet: Wears seat belt.    Male:   CCS-Colonoscopy 02/03/2015, polyps. Recall 3 years. Will call for appt.  PSA-  Lab Results  Component Value Date   PSA 0.90 09/18/2017   PSA 0.75 10/14/2014   PSA 0.84 09/29/2013       Objective:    Vitals: BP 122/68 (BP Location: Left Arm, Patient Position: Sitting, Cuff Size: Normal)   Pulse 65   Resp 16   Ht 5' 5"  (1.651 m)   Wt 232 lb 6 oz (105.4 kg)   SpO2 98%   BMI 38.67 kg/m   Body mass index is 38.67 kg/m.  Advanced Directives 04/09/2018 07/09/2017 01/22/2015  Does Patient Have a Medical Advance Directive? Yes No Yes  Type of Advance Directive Living will;Healthcare Power of [place] will  Copy of [place]? No - copy requested - -    Tobacco Social History   Tobacco Use  Smoking Status Never Smoker  Smokeless Tobacco Never Used     Counseling given: Not Answered    Past Medical History:  Diagnosis Date  . Allergy   . [place]  . Diverticulosis 2013   noted on colonoscopy  . Fatty liver 05/2012   u/s done for mild/persistent elevation of LFTs  . Hay fever    no issues now-? out grown  . Hearing impairment    11/2015 Audiology eval= not yet ready for hearing aids ([facility])  . History of basal cell carcinoma  of skin   . HTN (hypertension)   . Hx of adenomatous colonic polyps 09/2011; 01/2015   Recall 01/2018  . Hyperlipidemia   . Lumbar spondylosis 04/2013   with grade I spondylolisthesis L4 on L5.  . Microhematuria 09/2017   Plan repeat UA with micro 10/03/17  . Obesity   . 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 COLONOSCOPY     2008 w/polyps,  09-22-2011 w/polyps.  01/2015 tubular adenoma x 1.  Recall 01/2018.  .Marland KitchenCOLONOSCOPY W/ POLYPECTOMY  2008; 09/22/11;01/2015   Tubular aden[place]  Then [place] [facility]ilarie Fredricksonwith [place].  .Marland KitchenPOLYPECTOMY    . UMBILICAL HERNIA REPAIR  2011   Family History  Problem Relation Age of Onset  . Hypertension Father   . Alcoholism Father   . Stomach cancer Father        primary with mets to colon  . Colon cancer Father   . Dementia Mother   . Asthma Brother   . Diabetes Sister   . Colon polyps Neg Hx   . Esophageal cancer Neg Hx   . Rectal cancer Neg Hx    Social History   Socioeconomic History  . Marital status: Married  Spouse name: Not on file  . Number of children: Not on file  . Years of education: Not on file  . Highest education level: Not on file  Occupational History  . Not on file  Social Needs  . Financial resource strain: Not on file  . Food insecurity:    Worry: Not on file    Inability: Not on file  . Transportation needs:    Medical: Not on file    Non-medical: Not on file  Tobacco Use  . Smoking status: Never Smoker  . Smokeless tobacco: Never Used  Substance and Sexual Activity  . Alcohol use: Yes    Alcohol/week: 1.0 standard drinks    Types: 1 Cans of beer per week    Comment: occassional  . Drug use: No  . Sexual activity: Not on file  Lifestyle  . Physical activity:    Days per week: Not on file    Minutes per session: Not on file  .  Stress: Not on file  Relationships  . Social connections:    Talks on phone: Not on file    Gets together: Not on file    Attends religious service: Not on file    Active member of club or organization: Not on file    Attends meetings of clubs or organizations: Not on file    Relationship status: Not on file  Other Topics Concern  . Not on file  Social History Narrative   Married, 2 children, 3 grandchildren.   Occupation: retired Financial trader   Education: HS   No tob, minimal alcohol, no drugs.     Outpatient Encounter Medications as of 04/09/2018  Medication Sig  . aspirin EC 81 MG tablet Take 81 mg by mouth daily.  Marland Kitchen atorvastatin (LIPITOR) 20 MG tablet TAKE 1 TABLET EVERY DAY  . ciclopirox (LOPROX) 0.77 % cream Apply to each great toenail twice per day  . Ergocalciferol (VITAMIN D2) 2000 UNITS TABS Take 1 capsule by mouth daily.  Marland Kitchen lisinopril (PRINIVIL,ZESTRIL) 20 MG tablet Take 1 tablet (20 mg total) by mouth daily.  Marland Kitchen oxybutynin (DITROPAN-XL) 10 MG 24 hr tablet 1 tab po qd  . Zoster Vaccine Adjuvanted [facility]) injection Inject 0.5 mLs into the muscle once for 1 dose. (Patient not taking: Reported on 04/09/2018)   No facility-administered encounter medications on file as of 04/09/2018.     Activities of Daily Living In your present state of health, do you have any difficulty performing the following activities: 04/09/2018  Hearing? N  Vision? N  Difficulty concentrating or making decisions? N  Walking or climbing stairs? N  Dressing or bathing? N  Doing errands, shopping? N  Preparing Food and eating ? N  Using the Toilet? N  In the past six months, have you accidently leaked urine? N  Do you have problems with loss of bowel control? N  Managing your Medications? N  Managing your Finances? N  Housekeeping or managing your Housekeeping? N  Some recent data might be hidden    Patient Care Team: Tammi Sou, MD as PCP - General (Family  Medicine) Pyrtle, Lajuan Lines, MD as Consulting Physician (Gastroenterology) Center, Skin Surgery   Assessment:   This is a routine wellness examination for Gregory Blevins.  Exercise Activities and Dietary recommendations Current Exercise Habits: The patient does not participate in regular exercise at present(Yard work), Exercise limited by: None identified   Diet (meal preparation, eat out, water intake, caffeinated beverages, dairy products, fruits and vegetables):  Drinks milk and water (1 glass).   Breakfast: coffee; bagel; toast Lunch: skips; sandwich Dinner: protein and vegetables  Goals    . Weight (lb) < 210 lb (95.3 kg)     Lose weight by decreasing snacking.        Fall Risk Fall Risk  04/09/2018 09/18/2017 06/14/2017 03/22/2016 10/13/2015  Falls in the past year? No No No No No  Comment - - Emmi Telephone Survey: data to providers prior to load - -    Depression Screen PHQ 2/9 Scores 04/09/2018 09/18/2017 03/22/2016 10/13/2015  PHQ - 2 Score 0 0 1 0    Cognitive Function MMSE - Mini Mental State Exam 04/09/2018  Orientation to time 5  Orientation to Place 5  Registration 3  Attention/ Calculation 5  Recall 1  Language- name 2 objects 2  Language- repeat 1  Language- follow 3 step command 3  Language- read & follow direction 1  Write a sentence 1  Copy design 1  Total score 28        Immunization History  Administered Date(s) Administered  . Influenza Split 07/14/2011, 05/13/2012  . Influenza, High Dose Seasonal PF 04/16/2015, 03/22/2016, 03/21/2017, 04/09/2018  . Influenza,inj,Quad PF,6+ Mos 03/21/2013, 04/10/2014  . Pneumococcal Conjugate-13 04/10/2014  . Pneumococcal Polysaccharide-23 07/19/2009  . Td 06/17/2009  . Zoster 07/05/2010      Screening Tests Health Maintenance  Topic Date Due  . COLONOSCOPY  02/02/2018  . INFLUENZA VACCINE  02/07/2018  . TETANUS/TDAP  06/18/2019  . PNA vac Low Risk Adult  Completed        Plan:    Schedule GI appt.    Shingles vaccine at pharmacy.   Bring a copy of your living will and/or healthcare power of attorney to your next office visit.  Continue doing brain stimulating activities (puzzles, reading, adult coloring books, staying active) to keep memory sharp.    I have personally reviewed and noted the following in the patient's chart:   . Medical and social history . Use of alcohol, tobacco or illicit drugs  . Current medications and supplements . Functional ability and status . Nutritional status . Physical activity . Advanced directives . List of other physicians . Hospitalizations, surgeries, and ER visits in previous 12 months . Vitals . Screenings to include cognitive, depression, and falls . Referrals and appointments  In addition, I have reviewed and discussed with patient certain preventive protocols, quality metrics, and best practice recommendations. A written personalized care plan for preventive services as well as general preventive health recommendations were provided to patient.     Gerilyn Nestle, RN  04/09/2018

## 2018-04-09 ENCOUNTER — Encounter: Payer: Self-pay | Admitting: Internal Medicine

## 2018-04-09 ENCOUNTER — Encounter: Payer: Medicare Other | Admitting: Internal Medicine

## 2018-04-09 ENCOUNTER — Other Ambulatory Visit: Payer: Self-pay

## 2018-04-09 ENCOUNTER — Other Ambulatory Visit (INDEPENDENT_AMBULATORY_CARE_PROVIDER_SITE_OTHER): Payer: Medicare Other

## 2018-04-09 ENCOUNTER — Encounter: Payer: Self-pay | Admitting: *Deleted

## 2018-04-09 ENCOUNTER — Encounter: Payer: Self-pay | Admitting: Family Medicine

## 2018-04-09 ENCOUNTER — Ambulatory Visit (INDEPENDENT_AMBULATORY_CARE_PROVIDER_SITE_OTHER): Payer: Medicare Other

## 2018-04-09 ENCOUNTER — Ambulatory Visit (INDEPENDENT_AMBULATORY_CARE_PROVIDER_SITE_OTHER): Payer: Medicare Other | Admitting: Family Medicine

## 2018-04-09 VITALS — BP 122/68 | HR 65 | Resp 16 | Ht 65.0 in | Wt 232.4 lb

## 2018-04-09 VITALS — BP 122/68 | HR 65 | Temp 98.0°F | Resp 16 | Ht 68.0 in | Wt 232.4 lb

## 2018-04-09 DIAGNOSIS — E669 Obesity, unspecified: Secondary | ICD-10-CM

## 2018-04-09 DIAGNOSIS — E7800 Pure hypercholesterolemia, unspecified: Secondary | ICD-10-CM | POA: Diagnosis not present

## 2018-04-09 DIAGNOSIS — R3129 Other microscopic hematuria: Secondary | ICD-10-CM | POA: Diagnosis not present

## 2018-04-09 DIAGNOSIS — Z0000 Encounter for general adult medical examination without abnormal findings: Secondary | ICD-10-CM

## 2018-04-09 DIAGNOSIS — I10 Essential (primary) hypertension: Secondary | ICD-10-CM

## 2018-04-09 DIAGNOSIS — Z23 Encounter for immunization: Secondary | ICD-10-CM

## 2018-04-09 DIAGNOSIS — K760 Fatty (change of) liver, not elsewhere classified: Secondary | ICD-10-CM

## 2018-04-09 LAB — URINALYSIS, ROUTINE W REFLEX MICROSCOPIC
Bilirubin Urine: NEGATIVE
Ketones, ur: NEGATIVE
Leukocytes, UA: NEGATIVE
Nitrite: NEGATIVE
SPECIFIC GRAVITY, URINE: 1.02 (ref 1.000–1.030)
Total Protein, Urine: NEGATIVE
UROBILINOGEN UA: 0.2 (ref 0.0–1.0)
Urine Glucose: NEGATIVE
WBC, UA: NONE SEEN (ref 0–?)
pH: 6 (ref 5.0–8.0)

## 2018-04-09 LAB — LIPID PANEL
CHOL/HDL RATIO: 3
Cholesterol: 121 mg/dL (ref 0–200)
HDL: 39.5 mg/dL (ref >39.00)
LDL Cholesterol: 57 mg/dL (ref 0–99)
NonHDL: 81.65
TRIGLYCERIDES: 121 mg/dL (ref 0.0–149.0)
VLDL: 24.2 mg/dL (ref 0.0–40.0)

## 2018-04-09 LAB — COMPREHENSIVE METABOLIC PANEL
ALBUMIN: 4.2 g/dL (ref 3.5–5.2)
ALT: 32 U/L (ref 0–53)
AST: 23 U/L (ref 0–37)
Alkaline Phosphatase: 68 U/L (ref 39–117)
BUN: 23 mg/dL (ref 6–23)
CHLORIDE: 103 meq/L (ref 96–112)
CO2: 26 mEq/L (ref 19–32)
CREATININE: 1.2 mg/dL (ref 0.40–1.50)
Calcium: 9.1 mg/dL (ref 8.4–10.5)
GFR: 62.4 mL/min (ref >60.00)
GLUCOSE: 89 mg/dL (ref 70–99)
Potassium: 4.8 mEq/L (ref 3.5–5.1)
Sodium: 137 mEq/L (ref 135–145)
TOTAL PROTEIN: 6.8 g/dL (ref 6.0–8.3)
Total Bilirubin: 0.8 mg/dL (ref 0.2–1.2)

## 2018-04-09 MED ORDER — ZOSTER VAC RECOMB ADJUVANTED 50 MCG/0.5ML IM SUSR: 0.5000 mL | Freq: Once | INTRAMUSCULAR | 1 refills | Status: AC

## 2018-04-09 NOTE — Patient Instructions (Addendum)
Schedule GI appt.   Shingles vaccine at pharmacy.   Bring a copy of your living will and/or healthcare power of attorney to your next office visit.  Continue doing brain stimulating activities (puzzles, reading, adult coloring books, staying active) to keep memory sharp.    Health Maintenance, Male A healthy lifestyle and preventive care is important for your health and wellness. Ask your health care provider about what schedule of regular examinations is right for you. What should I know about weight and diet? Eat a Healthy Diet  Eat plenty of vegetables, fruits, whole grains, low-fat dairy products, and lean protein.  Do not eat a lot of foods high in solid fats, added sugars, or salt.  Maintain a Healthy Weight Regular exercise can help you achieve or maintain a healthy weight. You should:  Do at least 150 minutes of exercise each week. The exercise should increase your heart rate and make you sweat (moderate-intensity exercise).  Do strength-training exercises at least twice a week.  Watch Your Levels of Cholesterol and Blood Lipids  Have your blood tested for lipids and cholesterol every 5 years starting at 77 years of age. If you are at high risk for heart disease, you should start having your blood tested when you are 77 years old. You may need to have your cholesterol levels checked more often if: ? Your lipid or cholesterol levels are high. ? You are older than 77 years of age. ? You are at high risk for heart disease.  What should I know about cancer screening? Many types of cancers can be detected early and may often be prevented. Lung Cancer  You should be screened every year for lung cancer if: ? You are a current smoker who has smoked for at least 30 years. ? You are a former smoker who has quit within the past 15 years.  Talk to your health care provider about your screening options, when you should start screening, and how often you should be  screened.  Colorectal Cancer  Routine colorectal cancer screening usually begins at 77 years of age and should be repeated every 5-10 years until you are 77 years old. You may need to be screened more often if early forms of precancerous polyps or small growths are found. Your health care provider may recommend screening at an earlier age if you have risk factors for colon cancer.  Your health care provider may recommend using home test kits to check for hidden blood in the stool.  A small camera at the end of a tube can be used to examine your colon (sigmoidoscopy or colonoscopy). This checks for the earliest forms of colorectal cancer.  Prostate and Testicular Cancer  Depending on your age and overall health, your health care provider may do certain tests to screen for prostate and testicular cancer.  Talk to your health care provider about any symptoms or concerns you have about testicular or prostate cancer.  Skin Cancer  Check your skin from head to toe regularly.  Tell your health care provider about any new moles or changes in moles, especially if: ? There is a change in a mole's size, shape, or color. ? You have a mole that is larger than a pencil eraser.  Always use sunscreen. Apply sunscreen liberally and repeat throughout the day.  Protect yourself by wearing long sleeves, pants, a wide-brimmed hat, and sunglasses when outside.  What should I know about heart disease, diabetes, and high blood pressure?  If you  are 38-34 years of age, have your blood pressure checked every 3-5 years. If you are 52 years of age or older, have your blood pressure checked every year. You should have your blood pressure measured twice-once when you are at a hospital or clinic, and once when you are not at a hospital or clinic. Record the average of the two measurements. To check your blood pressure when you are not at a hospital or clinic, you can use: ? An automated blood pressure machine at a  pharmacy. ? A home blood pressure monitor.  Talk to your health care provider about your target blood pressure.  If you are between 81-94 years old, ask your health care provider if you should take aspirin to prevent heart disease.  Have regular diabetes screenings by checking your fasting blood sugar level. ? If you are at a normal weight and have a low risk for diabetes, have this test once every three years after the age of 44. ? If you are overweight and have a high risk for diabetes, consider being tested at a younger age or more often.  A one-time screening for abdominal aortic aneurysm (AAA) by ultrasound is recommended for men aged 47-75 years who are current or former smokers. What should I know about preventing infection? Hepatitis B If you have a higher risk for hepatitis B, you should be screened for this virus. Talk with your health care provider to find out if you are at risk for hepatitis B infection. Hepatitis C Blood testing is recommended for:  Everyone born from [birth year] through 1965.  Anyone with known risk factors for hepatitis C.  Sexually Transmitted Diseases (STDs)  You should be screened each year for STDs including gonorrhea and chlamydia if: ? You are sexually active and are younger than 77 years of age. ? You are older than 77 years of age and your health care provider tells you that you are at risk for this type of infection. ? Your sexual activity has changed since you were last screened and you are at an increased risk for chlamydia or gonorrhea. Ask your health care provider if you are at risk.  Talk with your health care provider about whether you are at high risk of being infected with HIV. Your health care provider may recommend a prescription medicine to help prevent HIV infection.  What else can I do?  Schedule regular health, dental, and eye exams.  Stay current with your vaccines (immunizations).  Do not use any tobacco products, such as  cigarettes, chewing tobacco, and e-cigarettes. If you need help quitting, ask your health care provider.  Limit alcohol intake to no more than 2 drinks per day. One drink equals 12 ounces of beer, 5 ounces of wine, or 1 ounces of hard liquor.  Do not use street drugs.  Do not share needles.  Ask your health care provider for help if you need support or information about quitting drugs.  Tell your health care provider if you often feel depressed.  Tell your health care provider if you have ever been abused or do not feel safe at home. This information is not intended to replace advice given to you by your health care provider. Make sure you discuss any questions you have with your health care provider. Document Released: 12/23/2007 Document Revised: 02/23/2016 Document Reviewed: 03/30/2015 Elsevier Interactive Patient Education  [employer].

## 2018-04-09 NOTE — Progress Notes (Signed)
OFFICE VISIT  04/09/2018   CC:  Chief Complaint  Patient presents with  . Follow-up    RCI, pt is fasting.    HPI:    Patient is a 77 y.o. Caucasian male who presents for 6 mo f/u HTN, HLD, and history of microhematuria. Feeling fine.   HTN: no home bp monitoring since last f/u visit.  Taking lisinopril qd.  HLD: compliant with statin. Working on not overeating. Recently lost 5 lbs on a cruise due to all the walking.  No episodes of gross hematuria.  No dysuria or flank pain or fevers.  ROS: no CP, no SOB, no wheezing, no cough, no dizziness, no HAs, no rashes, no melena/hematochezia.  No polyuria or polydipsia.  No myalgias or arthralgias.  Past Medical History:  Diagnosis Date  . Allergy   . Cancer ([place])    skin cancer  . Diverticulosis 2013   noted on colonoscopy  . Fatty liver 05/2012   u/s done for mild/persistent elevation of LFTs  . Hay fever    no issues now-? out grown  . Hearing impairment    11/2015 Audiology eval= not yet ready for hearing aids ([facility])  . History of basal cell carcinoma of skin   . HTN (hypertension)   . Hx of adenomatous colonic polyps 09/2011; 01/2015   Recall 01/2018  . Hyperlipidemia   . Lumbar spondylosis 04/2013   with grade I spondylolisthesis L4 on L5.  . Microhematuria 09/2017   Plan repeat UA with micro 10/03/17  . Obesity   . 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 COLONOSCOPY     2008 w/polyps,  09-22-2011 w/polyps.  01/2015 tubular adenoma x 1.  Recall 01/2018.  Marland Kitchen COLONOSCOPY W/ POLYPECTOMY  2008; 09/22/11;01/2015   Tubular adenoma--recall 3 yrs per Dr. Louanne Belton at [facility].  Then switched to Dr. Hilarie Fredrickson with [place].  Marland Kitchen POLYPECTOMY    . UMBILICAL HERNIA REPAIR  2011    Outpatient Medications Prior to Visit  Medication Sig Dispense Refill  . aspirin EC 81 MG tablet Take  81 mg by mouth daily.    Marland Kitchen atorvastatin (LIPITOR) 20 MG tablet TAKE 1 TABLET EVERY DAY 90 tablet 3  . ciclopirox (LOPROX) 0.77 % cream Apply to each great toenail twice per day 30 g 2  . Ergocalciferol (VITAMIN D2) 2000 UNITS TABS Take 1 capsule by mouth daily.    Marland Kitchen lisinopril (PRINIVIL,ZESTRIL) 20 MG tablet Take 1 tablet (20 mg total) by mouth daily. 90 tablet 3  . oxybutynin (DITROPAN-XL) 10 MG 24 hr tablet 1 tab po qd 90 tablet 3  . Zoster Vaccine Adjuvanted [facility]) injection Inject 0.5 mLs into the muscle once for 1 dose. (Patient not taking: Reported on 04/09/2018) 1 each 1   No facility-administered medications prior to visit.     No Known Allergies  ROS As per HPI  PE: Blood pressure 122/68, pulse 65, temperature 98 F (36.7 C), temperature source Oral, resp. rate 16, height 5' 8"  (1.727 m), weight 232 lb 6 oz (105.4 kg), SpO2 98 %. Body mass index is 35.33 kg/m.  Gen: Alert, well appearing.  Patient is oriented to person, place, time, and situation. AFFECT: pleasant, lucid thought and speech. No further exam today.  LABS:  Lab Results  Component Value Date   TSH 1.00 09/29/2013   Lab  Results  Component Value Date   WBC 10.8 (H) 07/09/2017   HGB 15.4 07/09/2017   HCT 46.0 07/09/2017   MCV 90.2 07/09/2017   PLT 157 07/09/2017   Lab Results  Component Value Date   CREATININE 0.96 09/18/2017   BUN 22 09/18/2017   NA 138 09/18/2017   K 4.1 09/18/2017   CL 103 09/18/2017   CO2 26 09/18/2017   Lab Results  Component Value Date   ALT 53 07/09/2017   AST 32 07/09/2017   ALKPHOS 77 07/09/2017   BILITOT 0.5 07/09/2017   Lab Results  Component Value Date   CHOL 142 09/20/2016   Lab Results  Component Value Date   HDL 40.00 09/20/2016   Lab Results  Component Value Date   LDLCALC 69 09/20/2016   Lab Results  Component Value Date   TRIG 162.0 (H) 09/20/2016   Lab Results  Component Value Date   CHOLHDL 4 09/20/2016   Lab Results  Component Value  Date   PSA 0.90 09/18/2017   PSA 0.75 10/14/2014   PSA 0.84 09/29/2013    IMPRESSION AND PLAN:  1) HTN: The current medical regimen is effective;  continue present plan and medications. Lytes/cr today.  2) HLD with hx of fatty liver: tolerating statin.  Overdue for FLP, which we'll do today. Working slowly but surely on dietary changes, but he remains pretty inactive.  3) Hx of microhematuria on dipstick: microscopy last f/u visit showed no RBCs. Plan is to repeat UA with reflex microscopy today. 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  Preventative health care: he is overdue for repeat colonoscopy (01/2018) for hx of adenomatous colon polyps.  An After Visit Summary was printed and given to the patient.  FOLLOW UP: Return in about 6 months (around 10/09/2018) for routine chronic illness f/u.  Signed:  Crissie Sickles, MD           04/09/2018

## 2018-04-10 ENCOUNTER — Encounter: Payer: Self-pay | Admitting: Family Medicine

## 2018-04-11 ENCOUNTER — Encounter: Payer: Self-pay | Admitting: *Deleted

## 2018-04-11 NOTE — Progress Notes (Signed)
AWV reviewed and agree. Signed:  Crissie Sickles, MD           04/11/2018

## 2018-05-03 DIAGNOSIS — D225 Melanocytic nevi of trunk: Secondary | ICD-10-CM | POA: Diagnosis not present

## 2018-05-03 DIAGNOSIS — D1801 Hemangioma of skin and subcutaneous tissue: Secondary | ICD-10-CM | POA: Diagnosis not present

## 2018-05-03 DIAGNOSIS — L570 Actinic keratosis: Secondary | ICD-10-CM | POA: Diagnosis not present

## 2018-05-03 DIAGNOSIS — Z85828 Personal history of other malignant neoplasm of skin: Secondary | ICD-10-CM | POA: Diagnosis not present

## 2018-05-03 DIAGNOSIS — L814 Other melanin hyperpigmentation: Secondary | ICD-10-CM | POA: Diagnosis not present

## 2018-05-03 DIAGNOSIS — L821 Other seborrheic keratosis: Secondary | ICD-10-CM | POA: Diagnosis not present

## 2018-05-08 ENCOUNTER — Ambulatory Visit: Payer: Self-pay

## 2018-05-08 ENCOUNTER — Ambulatory Visit (INDEPENDENT_AMBULATORY_CARE_PROVIDER_SITE_OTHER): Payer: Medicare Other | Admitting: Family Medicine

## 2018-05-08 ENCOUNTER — Encounter: Payer: Self-pay | Admitting: Family Medicine

## 2018-05-08 VITALS — BP 137/77 | HR 64 | Resp 16 | Ht 67.0 in | Wt 234.0 lb

## 2018-05-08 DIAGNOSIS — M25511 Pain in right shoulder: Secondary | ICD-10-CM

## 2018-05-08 DIAGNOSIS — M25512 Pain in left shoulder: Secondary | ICD-10-CM | POA: Diagnosis not present

## 2018-05-08 DIAGNOSIS — G8929 Other chronic pain: Secondary | ICD-10-CM | POA: Diagnosis not present

## 2018-05-08 DIAGNOSIS — M19019 Primary osteoarthritis, unspecified shoulder: Secondary | ICD-10-CM | POA: Diagnosis not present

## 2018-05-08 MED ORDER — METHYLPREDNISOLONE ACETATE 80 MG/ML IJ SUSP
80.0000 mg | Freq: Once | INTRAMUSCULAR | Status: AC
  Administered 2018-05-08: 80 mg via INTRAMUSCULAR

## 2018-05-08 MED ORDER — NAPROXEN 500 MG PO TABS: 500.0000 mg | ORAL_TABLET | Freq: Two times a day (BID) | ORAL | 0 refills | Status: DC

## 2018-05-08 NOTE — Patient Instructions (Signed)
Start naproxen (prescribed) every 12 hours for 5-7 days with food. Then can use as needed only.   I have referred you to ortho also to discuss your shoulder arthritis and see if they have anything to offer.    Arthritis Arthritis means joint pain. It can also mean joint disease. A joint is a place where bones come together. People who have arthritis may have:  Red joints.  Swollen joints.  Stiff joints.  Warm joints.  A fever.  A feeling of being sick.  Follow these instructions at home: Pay attention to any changes in your symptoms. Take these actions to help with your pain and swelling. Medicines  Take over-the-counter and prescription medicines only as told by your doctor.  Do not take aspirin for pain if your doctor says that you may have gout. Activity  Rest your joint if your doctor tells you to.  Avoid activities that make the pain worse.  Exercise your joint regularly as told by your doctor. Try doing exercises like: ? Swimming. ? Water aerobics. ? Biking. ? Walking. Joint Care   If your joint is swollen, keep it raised (elevated) if told by your doctor.  If your joint feels stiff in the morning, try taking a warm shower.  If you have diabetes, do not apply heat without asking your doctor.  If told, apply heat to the joint: ? Put a towel between the joint and the hot pack or heating pad. ? Leave the heat on the area for 20-30 minutes.  If told, apply ice to the joint: ? Put ice in a plastic bag. ? Place a towel between your skin and the bag. ? Leave the ice on for 20 minutes, 2-3 times per day.  Keep all follow-up visits as told by your doctor. Contact a doctor if:  The pain gets worse.  You have a fever. Get help right away if:  You have very bad pain in your joint.  You have swelling in your joint.  Your joint is red.  Many joints become painful and swollen.  You have very bad back pain.  Your leg is very weak.  You cannot control  your pee (urine) or poop (stool). This information is not intended to replace advice given to you by your health care provider. Make sure you discuss any questions you have with your health care provider. Document Released: 09/20/2009 Document Revised: 12/02/2015 Document Reviewed: 09/21/2014 Elsevier Interactive Patient Education  [employer].

## 2018-05-08 NOTE — Telephone Encounter (Signed)
Pt c/o 2 day history of right shoulder and neck pain and swelling. Pt stated that the pain is mild. Pt stated he thinks the pain and swelling is from arthritis or bone spurs. Pt was trimming the lawn 2 days ago. Pt c/o mild neck pain, and swelling where the shoulder and the neck meet.  No disposition applied but due to the pain and swelling made pt an appointment for today. No openings on Dr. Idelle Leech schedule for today. Appt made with Dr Raoul Pitch today at 10:00. Care advice given to pt and pt verbalized understanding.   Reason for Disposition . Shoulder pain  Answer Assessment - Initial Assessment Questions 1. ONSET: "When did the pain start?"     2 days  2. LOCATION: "Where is the pain located?"     Right shoulder to neck  3. PAIN: "How bad is the pain?" (Scale 1-10; or mild, moderate, severe)   - MILD (1-3): doesn't interfere with normal activities   - MODERATE (4-7): interferes with normal activities (e.g., work or school) or awakens from sleep   - SEVERE (8-10): excruciating pain, unable to do any normal activities, unable to move arm at all due to pain     mild 4. WORK OR EXERCISE: "Has there been any recent work or exercise that involved this part of the body?"     Trimming the lawn 2 days ago 5. CAUSE: "What do you think is causing the shoulder pain?"     Arthritis and bone spurs 6. OTHER SYMPTOMS: "Do you have any other symptoms?" (e.g., neck pain, swelling, rash, fever, numbness, weakness)     Mild neck pain, swelling where the shoulder and the neck meet 7. PREGNANCY: "Is there any chance you are pregnant?" "When was your last menstrual period?"     n/a  Protocols used: SHOULDER PAIN-A-AH

## 2018-05-08 NOTE — Progress Notes (Signed)
Gregory Blevins , 10-22-1940, 77 y.o., male MRN: [id number] Patient Care Team    Relationship Specialty Notifications Start End  [name], [name], MD PCP - General Family Medicine  07/31/12    Comment: Royal Piedra, MD Consulting Physician Gastroenterology  10/13/15   Center, [facility]    04/09/18     Chief Complaint  Patient presents with  . Shoulder Pain    Left should and neck pain     Subjective: Pt presents for an OV with complaints of left shoulder and neck pain of 1 week duration.  Associated symptoms include headache up to right side of neck/face. He states prior to onset of pain he had done some yard work and trimming the hedges around his properties. He reports raising his arms and laying on his right side makes pain worse. He took 2 tylenol and took the edge off yesterday. 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 There is no evidence of focal opacification, pleural effusion or pneumothorax. The heart is normal in size; the mediastinal contour is within normal limits. No acute osseous abnormalities are seen. Degenerative change is noted at the glenohumeral joints bilaterally, with sclerosis and osteophyte formation. Anterior bridging osteophytes are seen along the thoracic and upper lumbar spine. IMPRESSION: 1. No acute cardiopulmonary process seen. 2. Degenerative change at the glenohumeral joints bilaterally, with sclerosis and osteophyte formation.  Depression screen [facility] 2/9 04/09/2018 09/18/2017 03/22/2016 10/13/2015 04/16/2015  Decreased Interest 0 0 1 0 0  Down, Depressed, Hopeless 0 0 0 0 0  PHQ - 2 Score 0 0 1 0 0    No Known Allergies Social History   Tobacco Use  . Smoking status: Never Smoker  . Smokeless tobacco: Never Used  Substance Use Topics  .  Alcohol use: Yes    Alcohol/week: 1.0 standard drinks    Types: 1 Cans of beer per week    Comment: occassional   Past Medical History:  Diagnosis Date  . Allergy   . Cancer ([place])    skin cancer  . Diverticulosis 2013   noted on colonoscopy  . Fatty liver 05/2012   u/s done for mild/persistent elevation of LFTs  . Hay fever    no issues now-? out grown  . Hearing impairment    11/2015 Audiology eval= not yet ready for hearing aids ([facility])  . History of basal cell carcinoma of skin   . HTN (hypertension)   . Hx of adenomatous colonic polyps 09/2011; 01/2015   Recall 01/2018  . Hyperlipidemia   . Lumbar spondylosis 04/2013   with grade I spondylolisthesis L4 on L5.  . Microhematuria 09/2017   On dipstick only.  Urine micro 09/2017 and 04/2018-->no RBCs.  . Obesity   . 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 COLONOSCOPY     2008 w/polyps,  09-22-2011 w/polyps.  01/2015 tubular adenoma x 1.  Recall 01/2018.  Marland Kitchen COLONOSCOPY W/ POLYPECTOMY  2008; 09/22/11;01/2015   Tubular adenoma--recall 3 yrs per Dr. Louanne Belton at [place].  Then switched to Dr. Hilarie Fredrickson with [facility].  Marland Kitchen POLYPECTOMY    . UMBILICAL HERNIA REPAIR  2011   Family  History  Problem Relation Age of Onset  . Hypertension Father   . Alcoholism Father   . Stomach cancer Father        primary with mets to colon  . Colon cancer Father   . Dementia Mother   . Asthma Brother   . Diabetes Sister   . Colon polyps Neg Hx   . Esophageal cancer Neg Hx   . Rectal cancer Neg Hx    Allergies as of 05/08/2018   No Known Allergies     Medication List        Accurate as of 05/08/18 10:10 AM. Always use your most recent med list.          aspirin EC 81 MG tablet Take 81 mg by mouth daily.   atorvastatin 20 MG tablet Commonly known as:  LIPITOR TAKE 1 TABLET EVERY DAY     ciclopirox 0.77 % cream Commonly known as:  LOPROX Apply to each great toenail twice per day   lisinopril 20 MG tablet Commonly known as:  PRINIVIL,ZESTRIL Take 1 tablet (20 mg total) by mouth daily.   oxybutynin 10 MG 24 hr tablet Commonly known as:  DITROPAN-XL 1 tab po qd   Vitamin D2 2000 units Tabs Take 1 capsule by mouth daily.       All past medical history, surgical history, allergies, family history, immunizations andmedications were updated in the EMR today and reviewed under the history and medication portions of their EMR.     ROS: Negative, with the exception of above mentioned in HPI   Objective:  BP 137/77 (BP Location: Right Arm, Patient Position: Sitting, Cuff Size: Normal)   Pulse 64   Resp 16   Ht 5' 7"  (1.702 m)   Wt 234 lb (106.1 kg)   SpO2 97%   BMI 36.65 kg/m  Body mass index is 36.65 kg/m. Gen: Afebrile. No acute distress. Nontoxic in appearance, well developed, well nourished.  HENT: AT. [place].  MMM Eyes:Pupils Equal Round Reactive to light, Extraocular movements intact,  Conjunctiva without redness, discharge or icterus. MSK(cervical spine and bilateral arms): No erythema, no soft tissue swelling. No bony TTP cervical spine or right shoulder landmarks.  Ropy tissue changes right trap. Moderate decrease of cervical spine ROM all planes. Severe decrease in ROM bilateral arms, ABD right arm ~25 degree only before moving arm forward to extend instead. Good MS 5/5 bilateral arms. Neg hawkins, neg Yergason. NV intact distally.  Skin: no rashes, purpura or petechiae.  Neuro: Normal gait. PERLA. EOMi. Alert. Oriented x3   No exam data present No results found. No results found for this or any previous visit (from the past 24 hour(s)).  Assessment/Plan: Gregory Blevins is a 77 y.o. male present for OV for  Acute on Chronic pain of both shoulders/Arthritis, shoulder region - Bilateral shoulders with severe loss of range of motion. ABDuction especially.  Discussed options with him today and we can certainly help calm down the current flare with IM depo medrol shot and naproxen BID w/ food for 5-7 days, which he is agreeable to today. I do believe he should see ortho for further eval. His ROM is severely diminished bilateral arms and cervical spine. He agrees with plan.  - Ambulatory referral to Orthopedic Surgery - methylPREDNISolone acetate (DEPO-MEDROL) injection 80 mg - f/u 2 weeks with PCP if not improved.    Reviewed expectations re: course of current medical issues.  Discussed self-management of symptoms.  Outlined signs and symptoms indicating  need for more acute intervention.  Patient verbalized understanding and all questions were answered.  Patient received an After-Visit Summary.    No orders of the defined types were placed in this encounter.    Note is dictated utilizing voice recognition software. Although note has been proof read prior to signing, occasional typographical errors still can be missed. If any questions arise, please do not hesitate to call for verification.   electronically signed by:  Howard Pouch, DO  [place]

## 2018-05-15 ENCOUNTER — Ambulatory Visit (AMBULATORY_SURGERY_CENTER): Payer: Self-pay | Admitting: *Deleted

## 2018-05-15 VITALS — Ht 67.0 in | Wt 232.0 lb

## 2018-05-15 DIAGNOSIS — Z86010 Personal history of colonic polyps: Secondary | ICD-10-CM

## 2018-05-15 DIAGNOSIS — Z800 Family history of malignant neoplasm of digestive organs: Secondary | ICD-10-CM

## 2018-05-15 MED ORDER — PEG 3350-KCL-NA BICARB-NACL 420 G PO SOLR: 4000.0000 mL | Freq: Once | ORAL | 0 refills | Status: AC

## 2018-05-15 NOTE — Progress Notes (Signed)
No egg or soy allergy known to patient  No issues with past sedation with any surgeries  or procedures, no intubation problems  No diet pills per patient No home 02 use per patient  No blood thinners per patient  Pt denies issues with constipation  No A fib or A flutter  EMMI video sent to pt's e mail -- pt declined

## 2018-05-16 ENCOUNTER — Encounter (INDEPENDENT_AMBULATORY_CARE_PROVIDER_SITE_OTHER): Payer: Self-pay | Admitting: Orthopedic Surgery

## 2018-05-16 ENCOUNTER — Ambulatory Visit (INDEPENDENT_AMBULATORY_CARE_PROVIDER_SITE_OTHER): Payer: Self-pay

## 2018-05-16 ENCOUNTER — Ambulatory Visit (INDEPENDENT_AMBULATORY_CARE_PROVIDER_SITE_OTHER): Payer: Medicare Other | Admitting: Orthopedic Surgery

## 2018-05-16 DIAGNOSIS — M19012 Primary osteoarthritis, left shoulder: Secondary | ICD-10-CM | POA: Diagnosis not present

## 2018-05-16 DIAGNOSIS — M19011 Primary osteoarthritis, right shoulder: Secondary | ICD-10-CM

## 2018-05-16 MED ORDER — BUPIVACAINE HCL 0.5 % IJ SOLN
3.0000 mL | INTRAMUSCULAR | Status: AC | PRN
  Administered 2018-05-16: 3 mL via INTRA_ARTICULAR

## 2018-05-16 MED ORDER — TRIAMCINOLONE ACETONIDE 40 MG/ML IJ SUSP
60.0000 mg | INTRAMUSCULAR | Status: AC | PRN
  Administered 2018-05-16: 60 mg via INTRA_ARTICULAR

## 2018-05-16 NOTE — Progress Notes (Signed)
   Gregory Blevins - 77 y.o. male MRN [id number]  Date of birth: [birth year]  Office Visit Note: Visit Date: 05/16/2018 PCP: Tammi Sou, MD Referred by: Ma Hillock, DO  Subjective: Chief Complaint  Patient presents with  . Right Shoulder - Pain  . Left Shoulder - Pain   HPI:  Gregory Blevins is a 77 y.o. male who comes in today At the request of Dr. Anderson Malta for work in today for right intra-articular glenohumeral joint injection diagnostically and therapeutically.  ROS Otherwise per HPI.  Assessment & Plan: Visit Diagnoses:  1. Bilateral shoulder region arthritis     Plan: No additional findings.   Meds & Orders: No orders of the defined types were placed in this encounter.  No orders of the defined types were placed in this encounter.   Follow-up: Return if symptoms worsen or fail to improve.   Procedures: Large Joint Inj: R glenohumeral on 05/16/2018 9:39 AM Indications: pain and diagnostic evaluation Details: 22 G 3.5 in needle, fluoroscopy-guided anteromedial approach  Arthrogram: No  Medications: 3 mL bupivacaine 0.5 %; 60 mg triamcinolone acetonide 40 MG/ML Outcome: tolerated well, no immediate complications  There was excellent flow of contrast producing a partial arthrogram of the glenohumeral joint. The patient did have some relief of symptoms during the anesthetic phase of the injection.  He gets most of his pain at night with sleeping on it.  He does have decreased range of motion from the arthritis. Procedure, treatment alternatives, risks and benefits explained, specific risks discussed. Consent was given by the patient. Immediately prior to procedure a time out was called to verify the correct patient, procedure, equipment, support staff and site/side marked as required. Patient was prepped and draped in the usual sterile fashion.      No notes on file   Clinical History: No specialty comments available.     Objective:  VS:  HT:     WT:   BMI:     BP:   HR: bpm  TEMP: ( )  RESP:  Physical Exam  Ortho Exam Imaging: No results found.

## 2018-05-16 NOTE — Progress Notes (Signed)
Office Visit Note   Patient: Gregory Blevins           Date of Birth: 1941/05/03           MRN: [id number] Visit Date: 05/16/2018 Requested by: Ma Hillock, DO [address] [place], [place] [address] PCP: Tammi Sou, MD  Subjective: Chief Complaint  Patient presents with  . Right Shoulder - Pain  . Left Shoulder - Pain    HPI: Gregory Blevins is a patient with long-standing mild bilateral shoulder pain.  He reports some pain at night when he lays on that side.  He is able to work the Therapist, art.  He does not report any significant pain which is keeping him from doing what he wants to do.  He was prescribed naproxen recently.  He also had a cortisone shot systemically but not in the shoulder joint recently.  He does not have diabetes.  This pain does not wake him from sleep at night.  He is left-hand-dominant.  He is a retired Production designer, theatre/television/film but did not do very much physically in terms of putting miles on his shoulder during his lifetime.  He denies any neck pain or numbness and tingling in either arm.              ROS: All systems reviewed are negative as they relate to the chief complaint within the history of present illness.  Patient denies  fevers or chills.   Assessment & Plan: Visit Diagnoses:  1. Bilateral shoulder region arthritis     Plan: Impression is bilateral shoulder arthritis with mild symptoms and some potential loss of glenoid bone stock.  Plain radiographs from December 2018 are reviewed which do show end-stage shoulder arthritis.  Plan is glenohumeral shoulder joint injection today on the right-hand side.  He is having a little bit of right sided neck pain without radiculopathy.  I think this is potentially coming from his shoulder.  We will inject that shoulder joint today under fluoroscopic guidance.  See him back in several months if it helps and he would like to get the left shoulder joint injected.  I think he is clinically a ways away from shoulder  replacement at this time.  Follow-Up Instructions: Return if symptoms worsen or fail to improve.   Orders:  No orders of the defined types were placed in this encounter.  No orders of the defined types were placed in this encounter.     Procedures: No procedures performed   Clinical Data: No additional findings.  Objective: Vital Signs: There were no vitals taken for this visit.  Physical Exam:   Constitutional: Patient appears well-developed HEENT:  Head: Normocephalic Eyes:EOM are normal Neck: Normal range of motion Cardiovascular: Normal rate Pulmonary/chest: Effort normal Neurologic: Patient is alert Skin: Skin is warm Psychiatric: Patient has normal mood and affect    Ortho Exam: Ortho exam demonstrates 5 out of 5 grip EPL FPL interosseous wrist flexion wrist extension bicep triceps and deltoid strength.  Does have somewhat limited flexion extension of the cervical spine.  In terms of shoulder range of motion he has about 15 to 20 degrees of external rotation of 15 degrees of abduction with better rotator cuff strength on the left than the right particularly with infraspinatus and supra spinatus testing.  He has about 80 degrees of isolated glenohumeral abduction on the left and right with forward flexion just above 90 degrees on both sides.  He is able to put his hands above  his head.  Specialty Comments:  No specialty comments available.  Imaging: No results found.   PMFS History: Patient Active Problem List   Diagnosis Date Noted  . Prostate cancer screening 10/21/2015  . Preventative health care 04/10/2014  . 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 Pelvic pain in male 03/21/2013  . Health maintenance examination 09/27/2012  . HTN (hypertension), benign 09/27/2012   Past Medical History:  Diagnosis Date  . Allergy    past hx - not current  . Cancer ([place])    skin cancer  . Diverticulosis 2013   noted on  colonoscopy  . Fatty liver 05/2012   u/s done for mild/persistent elevation of LFTs  . Hay fever    no issues now-? out grown  . Hearing impairment    11/2015 Audiology eval= not yet ready for hearing aids ([facility])  . History of basal cell carcinoma of skin   . HTN (hypertension)   . Hx of adenomatous colonic polyps 09/2011; 01/2015   Recall 01/2018  . Hyperlipidemia   . Lumbar spondylosis 04/2013   with grade I spondylolisthesis L4 on L5.  . Microhematuria 09/2017   On dipstick only.  Urine micro 09/2017 and 04/2018-->no RBCs.  . Obesity   . 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 Rectal leakage     Family History  Problem Relation Age of Onset  . Hypertension Father   . Alcoholism Father   . Stomach cancer Father        primary with mets to colon  . Colon cancer Father   . Dementia Mother   . Asthma Brother   . Diabetes Sister   . Colon polyps Neg Hx   . Esophageal cancer Neg Hx   . Rectal cancer Neg Hx     Past Surgical History:  Procedure Laterality Date  . COLONOSCOPY     2008 w/polyps,  09-22-2011 w/polyps.  01/2015 tubular adenoma x 1.  Recall 01/2018.  Marland Kitchen COLONOSCOPY W/ POLYPECTOMY  2008; 09/22/11;01/2015   Tubular adenoma--recall 3 yrs per Dr. Louanne Belton at [facility].  Then switched to Dr. Hilarie Fredrickson with [facility].  Marland Kitchen POLYPECTOMY    . UMBILICAL HERNIA REPAIR  2011   Social History   Occupational History  . Not on file  Tobacco Use  . Smoking status: Never Smoker  . Smokeless tobacco: Never Used  Substance and Sexual Activity  . Alcohol use: Yes    Alcohol/week: 1.0 standard drinks    Types: 1 Cans of beer per week    Comment: occassional  . Drug use: No  . Sexual activity: Not on file

## 2018-05-16 NOTE — Addendum Note (Signed)
Addended by: Raymondo Band on: 05/16/2018 09:40 AM   Modules accepted: Orders

## 2018-05-23 ENCOUNTER — Encounter: Payer: Self-pay | Admitting: Family Medicine

## 2018-05-29 ENCOUNTER — Encounter: Payer: Self-pay | Admitting: Internal Medicine

## 2018-05-29 ENCOUNTER — Ambulatory Visit (AMBULATORY_SURGERY_CENTER): Payer: Medicare Other | Admitting: Internal Medicine

## 2018-05-29 VITALS — BP 107/53 | HR 77 | Temp 98.4°F | Resp 20 | Ht 67.0 in | Wt 232.0 lb

## 2018-05-29 DIAGNOSIS — D123 Benign neoplasm of transverse colon: Secondary | ICD-10-CM

## 2018-05-29 DIAGNOSIS — D124 Benign neoplasm of descending colon: Secondary | ICD-10-CM | POA: Diagnosis not present

## 2018-05-29 DIAGNOSIS — D120 Benign neoplasm of cecum: Secondary | ICD-10-CM | POA: Diagnosis not present

## 2018-05-29 DIAGNOSIS — D122 Benign neoplasm of ascending colon: Secondary | ICD-10-CM | POA: Diagnosis not present

## 2018-05-29 DIAGNOSIS — D128 Benign neoplasm of rectum: Secondary | ICD-10-CM | POA: Diagnosis not present

## 2018-05-29 DIAGNOSIS — D125 Benign neoplasm of sigmoid colon: Secondary | ICD-10-CM | POA: Diagnosis not present

## 2018-05-29 DIAGNOSIS — Z86010 Personal history of colonic polyps: Secondary | ICD-10-CM | POA: Diagnosis not present

## 2018-05-29 DIAGNOSIS — I10 Essential (primary) hypertension: Secondary | ICD-10-CM | POA: Diagnosis not present

## 2018-05-29 DIAGNOSIS — I4892 Unspecified atrial flutter: Secondary | ICD-10-CM | POA: Diagnosis not present

## 2018-05-29 MED ORDER — SODIUM CHLORIDE 0.9 % IV SOLN: 500.0000 mL | Freq: Once | INTRAVENOUS | Status: DC

## 2018-05-29 NOTE — Op Note (Addendum)
[facility] Patient Name: Gregory Blevins Procedure Date: 05/29/2018 10:00 AM MRN: [id number] Endoscopist: Jerene Bears , MD Age: 77 Referring MD:  Date of Birth: December 19, 1940 Gender: Male Account #: [id number] Procedure:                Colonoscopy Indications:              Surveillance: Personal history of adenomatous                            polyps on last colonoscopy 3 years ago Medicines:                Monitored Anesthesia Care Procedure:                Pre-Anesthesia Assessment:                           - Prior to the procedure, a History and Physical                            was performed, and patient medications and                            allergies were reviewed. The patient's tolerance of                            previous anesthesia was also reviewed. The risks                            and benefits of the procedure and the sedation                            options and risks were discussed with the patient.                            All questions were answered, and informed consent                            was obtained. Prior Anticoagulants: The patient has                            taken no previous anticoagulant or antiplatelet                            agents. ASA Grade Assessment: III - A patient with                            severe systemic disease. After reviewing the risks                            and benefits, the patient was deemed in                            satisfactory condition to undergo the procedure.  After obtaining informed consent, the colonoscope                            was passed under direct vision. Throughout the                            procedure, the patient's blood pressure, pulse, and                            oxygen saturations were monitored continuously. The                            Colonoscope was introduced through the anus and                            advanced to the cecum,  identified by appendiceal                            orifice and ileocecal valve. The colonoscopy was                            somewhat difficult due to significant looping.                            Successful completion of the procedure was aided by                            applying abdominal pressure. The quality of the                            bowel preparation was good. Scope In: 10:05:33 AM Scope Out: 10:34:19 AM Scope Withdrawal Time: 0 hours 21 minutes 10 seconds  Total Procedure Duration: 0 hours 28 minutes 46 seconds  Findings:                 The digital rectal exam was normal.                           Two sessile polyps were found in the cecum. The                            polyps were 3 to 7 mm in size. These polyps were                            removed with a cold snare. Resection and retrieval                            were complete.                           A 1 mm polyp was found in the ascending colon. The                            polyp was sessile. The polyp  was removed with a                            cold biopsy forceps. Resection and retrieval were                            complete.                           A 4 mm polyp was found in the transverse colon. The                            polyp was sessile. The polyp was removed with a                            cold snare. Resection and retrieval were complete.                           A 4 mm polyp was found in the descending colon. The                            polyp was sessile. The polyp was removed with a                            cold snare. Resection and retrieval were complete.                           A 5 mm polyp was found in the sigmoid colon. The                            polyp was sessile. The polyp was removed with a                            cold snare. Resection and retrieval were complete.                           Multiple small and large-mouthed diverticula were                             found in the sigmoid colon.                           Internal hemorrhoids were found during                            retroflexion. The hemorrhoids were medium-sized. Complications:            No immediate complications. Estimated Blood Loss:     Estimated blood loss was minimal. Impression:               - Two 3 to 7 mm polyps in the cecum, removed with a                            cold snare. Resected  and retrieved.                           - One 1 mm polyp in the ascending colon, removed                            with a cold biopsy forceps. Resected and retrieved.                           - One 4 mm polyp in the transverse colon, removed                            with a cold snare. Resected and retrieved.                           - One 4 mm polyp in the descending colon, removed                            with a cold snare. Resected and retrieved.                           - One 5 mm polyp in the sigmoid colon, removed with                            a cold snare. Resected and retrieved.                           - Diverticulosis in the sigmoid colon.                           - Internal hemorrhoids. Recommendation:           - Patient has a contact number available for                            emergencies. The signs and symptoms of potential                            delayed complications were discussed with the                            patient. Return to normal activities tomorrow.                            Written discharge instructions were provided to the                            patient.                           - Resume previous diet.                           - Continue present medications.                           -  Await pathology results.                           - Usually, routine screening/surveillance                            colonoscopy would be recommended to be done in 3-5                            years. However since colon cancer  screening tests                            generally stop between ages 55 and 21, I will leave                            it to you and your primary care physician to                            contact my office at that time if it is felt that                            colon cancer screening is still an important issue                            for you. Jerene Bears, MD 05/29/2018 10:43:46 AM This report has been signed electronically.

## 2018-05-29 NOTE — Progress Notes (Signed)
Called to room to assist during endoscopic procedure.  Patient ID and intended procedure confirmed with present staff. Received instructions for my participation in the procedure from the performing physician.  

## 2018-05-29 NOTE — Patient Instructions (Signed)
YOU HAD AN ENDOSCOPIC PROCEDURE TODAY AT [facility]:   Refer to the procedure report that was given to you for any specific questions about what was found during the examination.  If the procedure report does not answer your questions, please call your gastroenterologist to clarify.  If you requested that your care partner not be given the details of your procedure findings, then the procedure report has been included in a sealed envelope for you to review at your convenience later.  YOU SHOULD EXPECT: Some feelings of bloating in the abdomen. Passage of more gas than usual.  Walking can help get rid of the air that was put into your GI tract during the procedure and reduce the bloating. If you had a lower endoscopy (such as a colonoscopy or flexible sigmoidoscopy) you may notice spotting of blood in your stool or on the toilet paper. If you underwent a bowel prep for your procedure, you may not have a normal bowel movement for a few days.  Please Note:  You might notice some irritation and congestion in your nose or some drainage.  This is from the oxygen used during your procedure.  There is no need for concern and it should clear up in a day or so.  SYMPTOMS TO REPORT IMMEDIATELY:   Following lower endoscopy (colonoscopy or flexible sigmoidoscopy):  Excessive amounts of blood in the stool  Significant tenderness or worsening of abdominal pains  Swelling of the abdomen that is new, acute  Fever of 100F or higher  For urgent or emergent issues, a gastroenterologist can be reached at any hour by calling [phone].   DIET:  We do recommend a small meal at first, but then you may proceed to your regular diet.  Drink plenty of fluids but you should avoid alcoholic beverages for 24 hours.  ACTIVITY:  You should plan to take it easy for the rest of today and you should NOT DRIVE or use heavy machinery until tomorrow (because of the sedation medicines used during the test).     FOLLOW UP: Our staff will call the number listed on your records the next business day following your procedure to check on you and address any questions or concerns that you may have regarding the information given to you following your procedure. If we do not reach you, we will leave a message.  However, if you are feeling well and you are not experiencing any problems, there is no need to return our call.  We will assume that you have returned to your regular daily activities without incident.  If any biopsies were taken you will be contacted by phone or by letter within the next 1-3 weeks.  Please call us at [phone] if you have not heard about the biopsies in 3 weeks.    SIGNATURES/CONFIDENTIALITY: You and/or your care partner have signed paperwork which will be entered into your electronic medical record.  These signatures attest to the fact that that the information above on your After Visit Summary has been reviewed and is understood.  Full responsibility of the confidentiality of this discharge information lies with you and/or your care-partner  Polyp, diverticulosis, and hemorrhoid information given.Marland Kitchen

## 2018-05-29 NOTE — Progress Notes (Signed)
Report given to PACU, vss 

## 2018-05-30 ENCOUNTER — Telehealth: Payer: Self-pay

## 2018-05-30 NOTE — Telephone Encounter (Signed)
  Follow up Call-  Call back number 05/29/2018  Post procedure Call Back phone  # [phone]  Permission to leave phone message Yes  Some recent data might be hidden     Patient questions:  Do you have a fever, pain , or abdominal swelling? No. Pain Score  0 *  Have you tolerated food without any problems? Yes.    Have you been able to return to your normal activities? Yes.    Do you have any questions about your discharge instructions: Diet   No. Medications  No. Follow up visit  No.  Do you have questions or concerns about your Care? No.  Actions: * If pain score is 4 or above: No action needed, pain <4.

## 2018-06-04 ENCOUNTER — Encounter: Payer: Self-pay | Admitting: Internal Medicine

## 2018-06-08 ENCOUNTER — Encounter: Payer: Self-pay | Admitting: Family Medicine

## 2018-06-28 ENCOUNTER — Other Ambulatory Visit: Payer: Self-pay | Admitting: Family Medicine

## 2018-08-23 DIAGNOSIS — L853 Xerosis cutis: Secondary | ICD-10-CM | POA: Diagnosis not present

## 2018-08-23 DIAGNOSIS — L570 Actinic keratosis: Secondary | ICD-10-CM | POA: Diagnosis not present

## 2018-08-23 DIAGNOSIS — L720 Epidermal cyst: Secondary | ICD-10-CM | POA: Diagnosis not present

## 2018-08-23 DIAGNOSIS — D225 Melanocytic nevi of trunk: Secondary | ICD-10-CM | POA: Diagnosis not present

## 2018-08-23 DIAGNOSIS — D1801 Hemangioma of skin and subcutaneous tissue: Secondary | ICD-10-CM | POA: Diagnosis not present

## 2018-08-23 DIAGNOSIS — Z85828 Personal history of other malignant neoplasm of skin: Secondary | ICD-10-CM | POA: Diagnosis not present

## 2018-08-23 DIAGNOSIS — L821 Other seborrheic keratosis: Secondary | ICD-10-CM | POA: Diagnosis not present

## 2018-08-23 DIAGNOSIS — L814 Other melanin hyperpigmentation: Secondary | ICD-10-CM | POA: Diagnosis not present

## 2018-10-01 ENCOUNTER — Telehealth: Payer: Self-pay | Admitting: Family Medicine

## 2018-10-01 ENCOUNTER — Encounter: Payer: Self-pay | Admitting: Family Medicine

## 2018-10-01 NOTE — Telephone Encounter (Signed)
Copied from [place] [phone]. Topic: General - Other >> Oct 01, 2018  9:23 AM Richardo Priest, NT wrote: Reason for CRM:  Patient called in and left voicemail on refill line. Patient stated that his oxybutynin (DITROPAN-XL) 10 MG 24 hr tablet needed to be refilled, however he wanted to pass it along to Dr.[name] that he does not plan on continuing to take this medication because "it is not doing what is should be doing" so he is going to "let it run out." Patient has a future appointment on April 1st, can further discuss then.

## 2018-10-01 NOTE — Telephone Encounter (Signed)
Noted  

## 2018-10-01 NOTE — Telephone Encounter (Signed)
FYI:  Spoke with patient to get clarification and he said the medication is no longer as effective. It only works about 10 percent and expensive for the 90 day supply. He would like to wait until appt on 10/09/18 to discuss other options.

## 2018-10-09 ENCOUNTER — Encounter: Payer: Self-pay | Admitting: Family Medicine

## 2018-10-09 ENCOUNTER — Ambulatory Visit (INDEPENDENT_AMBULATORY_CARE_PROVIDER_SITE_OTHER): Payer: Medicare Other | Admitting: Family Medicine

## 2018-10-09 ENCOUNTER — Other Ambulatory Visit: Payer: Self-pay

## 2018-10-09 ENCOUNTER — Ambulatory Visit: Payer: Medicare Other | Admitting: Family Medicine

## 2018-10-09 ENCOUNTER — Other Ambulatory Visit: Payer: Self-pay | Admitting: Family Medicine

## 2018-10-09 VITALS — BP 140/61

## 2018-10-09 DIAGNOSIS — E7800 Pure hypercholesterolemia, unspecified: Secondary | ICD-10-CM

## 2018-10-09 DIAGNOSIS — N3281 Overactive bladder: Secondary | ICD-10-CM

## 2018-10-09 DIAGNOSIS — Z0000 Encounter for general adult medical examination without abnormal findings: Secondary | ICD-10-CM

## 2018-10-09 DIAGNOSIS — Q845 Enlarged and hypertrophic nails: Secondary | ICD-10-CM

## 2018-10-09 DIAGNOSIS — I10 Essential (primary) hypertension: Secondary | ICD-10-CM | POA: Diagnosis not present

## 2018-10-09 DIAGNOSIS — R7989 Other specified abnormal findings of blood chemistry: Secondary | ICD-10-CM

## 2018-10-09 NOTE — Progress Notes (Signed)
OFFICE VISIT  10/09/2018   CC:  Chief Complaint  Patient presents with  . Follow-up    [facility]   HPI:    Patient is a 78 y.o. Caucasian male With whom I am doing a telephone visit today (due to COVID-19 pandemic restrictions). Prior to proceeding with our visit today, the telephone visit process was explained  and pt gave consent to proceed and understood that this process would be used to assess and treat the patient.  This is six mo f/u HTN, HLD, OAB/urge incontinence, elevated sCr last f/u visit.  Says he feels fine.  Says oxybutynin was not helping any.  Was working some initially. Symptoms: urinary frequency (about every hour or so).  No unusual urgency. Nocturia x 1.  No excessive caffeine or sugar drinks.  Stream not delayed, normal force.  Empties bladder fine.  No pain with urination.  HTN: home monitoring about monthly. Systolics 893-734. Diastolics 28J-68T.  HLD: tolerating statin fine. Minimal exercise. Diet not high in simple sugars.    Elevated sCr 6 mo ago: Needs to improve water intake: drinks 1 glass water per day!    Past Medical History:  Diagnosis Date  . Diverticulosis 2013   noted on colonoscopy  . Fatty liver 05/2012   u/s done for mild/persistent elevation of LFTs  . Hay fever    no issues now-? out grown  . Hearing impairment    11/2015 Audiology eval= not yet ready for hearing aids ([facility])  . History of adenomatous polyp of colon    On multiple colonoscopies: most recent colonoscopy 05/31/18-->adenoma x 2, repeat 3-5 ys is optional.  . History of basal cell carcinoma of skin   . HTN (hypertension)   . Hyperlipidemia   . Lumbar spondylosis 04/2013   with grade I spondylolisthesis L4 on L5.  . Microhematuria 09/2017   On dipstick only.  Urine micro 09/2017 and 04/2018-->no RBCs.  . OAB (overactive bladder)    ditropan xl 10 ineffective--pt d/c'd this 09/2018  . Obesity   . Osteoarthritis    Hips (L>R on x-ray 04/2013),  shoulders, and knees (hx of adhesive capsulitis of shoulder)  . Rectal leakage     Past Surgical History:  Procedure Laterality Date  . COLONOSCOPY     2008 w/polyps,  09-22-2011 w/polyps.  01/2015 tubular adenoma x 1.  05/31/18 colonoscopy w/ two tubular adenomas.  . COLONOSCOPY W/ POLYPECTOMY  2008; 09/22/11;01/2015   Tubular adenoma--recall 3 yrs per Dr. Louanne Belton at [facility].  Then switched to Dr. Hilarie Fredrickson with [facility].  Marland Kitchen POLYPECTOMY    . UMBILICAL HERNIA REPAIR  2011    Outpatient Medications Prior to Visit  Medication Sig Dispense Refill  . aspirin EC 81 MG tablet Take 81 mg by mouth daily.    Marland Kitchen atorvastatin (LIPITOR) 20 MG tablet TAKE 1 TABLET EVERY DAY 90 tablet 1  . Ergocalciferol (VITAMIN D2) 2000 UNITS TABS Take 1 capsule by mouth daily.    Marland Kitchen lisinopril (PRINIVIL,ZESTRIL) 20 MG tablet Take 1 tablet (20 mg total) by mouth daily. 90 tablet 3  . ciclopirox (LOPROX) 0.77 % cream Apply to each great toenail twice per day (Patient not taking: Reported on 10/09/2018) 30 g 2  . naproxen (NAPROSYN) 500 MG tablet Take 1 tablet (500 mg total) by mouth 2 (two) times daily with a meal. (Patient not taking: Reported on 10/09/2018) 30 tablet 0  . oxybutynin (DITROPAN-XL) 10 MG 24 hr tablet 1 tab po qd (Patient  not taking: Reported on 10/09/2018) 90 tablet 3   No facility-administered medications prior to visit.     No Known Allergies  ROS As per HPI  PE: Blood pressure 140/61. Gen: Alert, NAD by voice/interaction on phone.  Patient is oriented to person, place, time, and situation. Lucid thought and speech.   LABS:    Chemistry      Component Value Date/Time   NA 137 04/09/2018 0930   K 4.8 04/09/2018 0930   CL 103 04/09/2018 0930   CO2 26 04/09/2018 0930   BUN 23 04/09/2018 0930   CREATININE 1.20 04/09/2018 0930      Component Value Date/Time   CALCIUM 9.1 04/09/2018 0930   ALKPHOS 68 04/09/2018 0930   AST 23 04/09/2018 0930   ALT 32 04/09/2018 0930   BILITOT 0.8  04/09/2018 0930     Lab Results  Component Value Date   CHOL 121 04/09/2018   HDL 39.50 04/09/2018   LDLCALC 57 04/09/2018   LDLDIRECT 76.0 03/22/2016   TRIG 121.0 04/09/2018   CHOLHDL 3 04/09/2018   Lab Results  Component Value Date   WBC 10.8 (H) 07/09/2017   HGB 15.4 07/09/2017   HCT 46.0 07/09/2017   MCV 90.2 07/09/2017   PLT 157 07/09/2017    IMPRESSION AND PLAN:  1) HTN: Avg 130/70s.  No changes at this time. Lytes normal 6 mo ago. sCr up from baseline at that time, but he is drinking a VERY inadequate volume of fluids per day on a consistently basis.  2) HLD: Tolerating statin.  Lipids excellent 6 mo ago. The current medical regimen is effective;  continue present plan and medications. Needs to work harder on diet and definitely needs to work harder on getting more exercise and losing wt.  3) OAB: he is willing to just tolerate these sx's for now. Oxybutynin mildly helpful at first but this waned so he is not going to RF it. No symptoms of BPH.  4) Elevated sCr: his baseline sCr was about 1. It was 1.2 last visit, and we did bring to light today that he needs to at least QUADRUPLE the amount of clear fluids he drinks daily.  5) Preventative health: we discussed the latest guidelines regarding use of aspirin for primary prevention and it's potential for >risks than benefits in his age population. He will stop aspirin at this time.  Spent 25 min with pt today, with >50% of this time spent in counseling and care coordination regarding the above problems.  An After Visit Summary was printed and given to the patient.  FOLLOW UP: No follow-ups on file.  Signed:  Crissie Sickles, MD           10/09/2018

## 2018-10-30 ENCOUNTER — Encounter: Payer: Self-pay | Admitting: Podiatry

## 2018-10-30 ENCOUNTER — Other Ambulatory Visit: Payer: Self-pay

## 2018-10-30 ENCOUNTER — Ambulatory Visit (INDEPENDENT_AMBULATORY_CARE_PROVIDER_SITE_OTHER): Payer: Medicare Other | Admitting: Podiatry

## 2018-10-30 VITALS — BP 122/73 | HR 87 | Temp 98.1°F | Resp 16

## 2018-10-30 DIAGNOSIS — M79675 Pain in left toe(s): Secondary | ICD-10-CM

## 2018-10-30 DIAGNOSIS — B351 Tinea unguium: Secondary | ICD-10-CM

## 2018-10-30 DIAGNOSIS — M79674 Pain in right toe(s): Secondary | ICD-10-CM

## 2018-10-30 DIAGNOSIS — M2021 Hallux rigidus, right foot: Secondary | ICD-10-CM

## 2018-10-30 DIAGNOSIS — L309 Dermatitis, unspecified: Secondary | ICD-10-CM | POA: Diagnosis not present

## 2018-10-30 DIAGNOSIS — M2022 Hallux rigidus, left foot: Secondary | ICD-10-CM | POA: Diagnosis not present

## 2018-10-30 NOTE — Progress Notes (Signed)
Subjective:   Patient ID: Gregory Blevins, male   DOB: 77 y.o.   MRN: [id number]   HPI Patient presents stating that the big toenails are very thickened and all nails are thick and impossible to cut.  States it is been ongoing and gradually getting worse and increasingly more difficult for him to take care of.  Patient does not smoke likes to be active and is been dealing with this for years   Review of Systems  All other systems reviewed and are negative.       Objective:  Physical Exam Vitals signs and nursing note reviewed.  Constitutional:      Appearance: He is well-developed.  Pulmonary:     Effort: Pulmonary effort is normal.  Musculoskeletal: Normal range of motion.  Skin:    General: Skin is warm.  Neurological:     Mental Status: He is alert.     Neurovascular status mildly diminished with diminished sharp dull vibratory noted and diminished digital perfusion.  Patient is noted to have thickened nailbeds 1-5 both feet that are incurvated in the corners and tender with yellow subungual debris associated with them.  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  Today sterile debridement of all nailbeds accomplished 1-5 with no iatrogenic bleeding and advised him on trimming techniques and he will see a physician in our group about every 10 weeks for debridement and was encouraged to come in earlier if issues were to occur

## 2018-10-30 NOTE — Progress Notes (Signed)
   Subjective:    Patient ID: Gregory Blevins, male    DOB: Jul 07, 1941, 78 y.o.   MRN: [id number]  HPI    Review of Systems  All other systems reviewed and are negative.      Objective:   Physical Exam        Assessment & Plan:

## 2018-12-03 ENCOUNTER — Other Ambulatory Visit: Payer: Self-pay | Admitting: Family Medicine

## 2018-12-28 ENCOUNTER — Other Ambulatory Visit: Payer: Self-pay | Admitting: Family Medicine

## 2019-01-08 ENCOUNTER — Ambulatory Visit (INDEPENDENT_AMBULATORY_CARE_PROVIDER_SITE_OTHER): Payer: Medicare Other | Admitting: Podiatry

## 2019-01-08 ENCOUNTER — Other Ambulatory Visit: Payer: Self-pay

## 2019-01-08 ENCOUNTER — Encounter: Payer: Self-pay | Admitting: Podiatry

## 2019-01-08 VITALS — Temp 98.1°F

## 2019-01-08 DIAGNOSIS — B351 Tinea unguium: Secondary | ICD-10-CM

## 2019-01-08 DIAGNOSIS — M79675 Pain in left toe(s): Secondary | ICD-10-CM

## 2019-01-08 DIAGNOSIS — M79674 Pain in right toe(s): Secondary | ICD-10-CM | POA: Diagnosis not present

## 2019-01-08 NOTE — Patient Instructions (Signed)

## 2019-01-12 NOTE — Progress Notes (Signed)
Subjective:  Gregory Blevins presents to clinic today with cc of  painful, thick, discolored, elongated toenails 1-5 b/l that become tender and cannot cut because of thickness. Pain is aggravated when wearing enclosed shoe gear and relieved with periodic professional debridement.  He voices no new pedal concerns on today's visit.  [name], [name], MD is his PCP.    Current Outpatient Medications:  .  atorvastatin (LIPITOR) 20 MG tablet, TAKE 1 TABLET EVERY DAY, Disp: 90 tablet, Rfl: 1 .  Cholecalciferol (VITAMIN D3 PO), Take 1 tablet by mouth daily., Disp: , Rfl:  .  lisinopril (ZESTRIL) 20 MG tablet, TAKE 1 TABLET EVERY DAY, Disp: 90 tablet, Rfl: 1   No Known Allergies   Objective: Vitals:   01/08/19 0934  Temp: 98.1 F (36.7 C)    Physical Examination:  Vascular Examination: Capillary refill time <4 seconds x 10 digits.  Diminished DP/PT pulses b/l.  Digital hair sparse b/l.  No edema noted b/l.  Skin temperature gradient warm to cool b/l.  No ischemia noted b/l.  Dermatological Examination: Skin thin, shiny and atrophic b/l.  No open wounds b/l.  No interdigital macerations noted b/l.  Elongated, thick, discolored brittle toenails with subungual debris and pain on dorsal palpation of nailbeds 1-5 b/l.  Musculoskeletal Examination: Muscle strength 5/5 to all muscle groups b/l.  No pain, crepitus or joint discomfort with active/passive ROM.  Neurological Examination: Sensation intact 5/5 b/l with 10 gram monofilament.  Vibratory sensation intact b/l.  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 Toenails 1-5 b/l were debrided in length and girth without iatrogenic laceration. 2.  Continue soft, supportive shoe gear daily. 3.  Report any pedal injuries to medical professional. 4.  Follow up 3 months. 5.  Patient/POA to call should there be a question/concern in there interim.

## 2019-03-10 DIAGNOSIS — H524 Presbyopia: Secondary | ICD-10-CM | POA: Diagnosis not present

## 2019-03-10 DIAGNOSIS — H52221 Regular astigmatism, right eye: Secondary | ICD-10-CM | POA: Diagnosis not present

## 2019-03-10 DIAGNOSIS — H5203 Hypermetropia, bilateral: Secondary | ICD-10-CM | POA: Diagnosis not present

## 2019-03-10 DIAGNOSIS — H2513 Age-related nuclear cataract, bilateral: Secondary | ICD-10-CM | POA: Diagnosis not present

## 2019-03-10 DIAGNOSIS — H5200 Hypermetropia, unspecified eye: Secondary | ICD-10-CM | POA: Diagnosis not present

## 2019-03-10 DIAGNOSIS — H35033 Hypertensive retinopathy, bilateral: Secondary | ICD-10-CM | POA: Diagnosis not present

## 2019-04-08 ENCOUNTER — Other Ambulatory Visit: Payer: Self-pay

## 2019-04-08 ENCOUNTER — Ambulatory Visit (INDEPENDENT_AMBULATORY_CARE_PROVIDER_SITE_OTHER): Payer: Medicare Other | Admitting: Podiatry

## 2019-04-08 ENCOUNTER — Encounter: Payer: Self-pay | Admitting: Podiatry

## 2019-04-08 DIAGNOSIS — M79675 Pain in left toe(s): Secondary | ICD-10-CM

## 2019-04-08 DIAGNOSIS — I739 Peripheral vascular disease, unspecified: Secondary | ICD-10-CM

## 2019-04-08 DIAGNOSIS — M79674 Pain in right toe(s): Secondary | ICD-10-CM

## 2019-04-08 DIAGNOSIS — B351 Tinea unguium: Secondary | ICD-10-CM

## 2019-04-08 NOTE — Patient Instructions (Signed)

## 2019-04-09 NOTE — Progress Notes (Signed)
Subjective:  Gregory Blevins presents to clinic today with cc of  painful, thick, discolored, elongated toenails 1-5 b/l that become tender and cannot cut because of thickness. Pain is aggravated when wearing enclosed shoe gear.  Current Outpatient Medications on File Prior to Visit  Medication Sig Dispense Refill  . atorvastatin (LIPITOR) 20 MG tablet TAKE 1 TABLET EVERY DAY 90 tablet 1  . Cholecalciferol (VITAMIN D3 PO) Take 1 tablet by mouth daily.    Marland Kitchen lisinopril (ZESTRIL) 20 MG tablet TAKE 1 TABLET EVERY DAY 90 tablet 1   No current facility-administered medications on file prior to visit.      No Known Allergies   Objective: Physical Examination:  Vascular Examination: Capillary refill time I<4 seconds x 10 digits.  Diminished  DP/PT pulses b/l.  Digital hair sparse b/l.  No edema noted b/l.  Skin temperature gradient warm to cool b/l.  No ischemia/gangrene b/l.  Dermatological Examination: Skin thin, shiny and atrophic b/l  No open wounds b/l.  No interdigital macerations noted b/l.  Elongated, thick, discolored brittle toenails with subungual debris and pain on dorsal palpation of nailbeds 1-5 b/l.  Musculoskeletal Examination: Muscle strength 5/5 to all muscle groups b/l  No pain, crepitus or joint discomfort with active/passive ROM.  Neurological Examination: Sensation intact 5/5 b/l with 10 gram monofilament.  Vibratory sensation intact b/l.  Proprioceptive sensation intact b/l.  Assessment: Mycotic nail infection with pain 1-5 b/l  Plan: 1. Toenails 1-5 b/l were debrided in length and girth without iatrogenic laceration. 2.  Continue soft, supportive shoe gear daily. 3.  Report any pedal injuries to medical professional. 4.  Follow up 3 months. 5.  Patient/POA to call should there be a question/concern in there interim.

## 2019-04-10 NOTE — Progress Notes (Addendum)
Subjective:   Gregory Blevins is a 78 y.o. male who presents for Medicare Annual/Subsequent preventive examination.  Review of Systems:  No ROS.  Medicare Wellness Visit.  See social history for additional risk factors.  Cardiac Risk Factors include: advanced age (>54mn, >>63women);dyslipidemia;male gender;hypertension;obesity (BMI >30kg/m2);sedentary lifestyle   Sleep patterns: Sleeps 6 hours.  Home Safety/Smoke Alarms: Feels safe in home. Smoke alarms in place.  Living environment; residence and Firearm Safety: Lives with wife in 1 story home.  Seat Belt Safety/Bike Helmet: Wears seat belt.   Male:   CCS-Colonoscopy 05/29/2018, polyp. No recall.   PSA-  Lab Results  Component Value Date   PSA 0.90 09/18/2017   PSA 0.75 10/14/2014   PSA 0.84 09/29/2013       Objective:    Vitals: BP 130/64 (BP Location: Left Arm, Patient Position: Sitting, Cuff Size: Normal)   Pulse 67   Ht 5' 7"  (1.702 m)   Wt 230 lb 8 oz (104.6 kg)   SpO2 98%   BMI 36.10 kg/m   Body mass index is 36.1 kg/m.  Advanced Directives 04/11/2019 04/09/2018 07/09/2017 01/22/2015  Does Patient Have a Medical Advance Directive? Yes Yes No Yes  Type of AParamedicof [place] will Living will;Healthcare Power of AMount LeonardLiving will  Copy of HFultonin Chart? Yes - validated most recent copy scanned in chart (See row information) No - copy requested - -    Tobacco Social History   Tobacco Use  Smoking Status Never Smoker  Smokeless Tobacco Never Used     Counseling given: Not Answered    Past Medical History:  Diagnosis Date  . Diverticulosis 2013   noted on colonoscopy  . Fatty liver 05/2012   u/s done for mild/persistent elevation of LFTs  . Hay fever    no issues now-? out grown  . Hearing impairment    11/2015 Audiology eval= not yet ready for hearing aids ([facility])  . History of  adenomatous polyp of colon    On multiple colonoscopies: most recent colonoscopy 05/31/18-->adenoma x 2, repeat 3-5 ys is optional.  . History of basal cell carcinoma of skin   . HTN (hypertension)   . Hyperlipidemia   . Lumbar spondylosis 04/2013   with grade I spondylolisthesis L4 on L5.  . Microhematuria 09/2017   On dipstick only.  Urine micro 09/2017 and 04/2018-->no RBCs.  . OAB (overactive bladder)    ditropan xl 10 ineffective--pt d/c'd this 09/2018  . Obesity   . 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 Rectal leakage    Past Surgical History:  Procedure Laterality Date  . COLONOSCOPY     2008 w/polyps,  09-22-2011 w/polyps.  01/2015 tubular adenoma x 1.  05/31/18 colonoscopy w/ two tubular adenomas.  . COLONOSCOPY W/ POLYPECTOMY  2008; 09/22/11;01/2015   Tubular adenoma--recall 3 yrs [place]  Then [place]ilarie Fredrickson[place].  .Marland KitchenPOLYPECTOMY    . UMBILICAL HERNIA REPAIR  2011   Family History  Problem Relation Age of Onset  . Hypertension Father   . Alcoholism Father   . Stomach cancer Father        primary with mets to colon  . Colon cancer Father   . Dementia Mother   . Asthma Brother   . Diabetes Sister   .  Colon polyps Neg Hx   . Esophageal cancer Neg Hx   . Rectal cancer Neg Hx    Social History   Socioeconomic History  . Marital status: Married    Spouse name: Not on file  . Number of children: Not on file  . Years of education: Not on file  . Highest education level: Not on file  Occupational History  . Not on file  Social Needs  . Financial resource strain: Not on file  . Food insecurity    Worry: Not on file    Inability: Not on file  . Transportation needs    Medical: Not on file    Non-medical: Not on file  Tobacco Use  . Smoking status: Never Smoker  . Smokeless tobacco: Never Used  Substance and Sexual Activity  . Alcohol use: Yes     Alcohol/week: 1.0 standard drinks    Types: 1 Cans of beer per week    Comment: occassional  . Drug use: No  . Sexual activity: Not on file  Lifestyle  . Physical activity    Days per week: Not on file    Minutes per session: Not on file  . Stress: Not on file  Relationships  . Social Herbalist on phone: Not on file    Gets together: Not on file    Attends religious service: Not on file    Active member of club or organization: Not on file    Attends meetings of clubs or organizations: Not on file    Relationship status: Not on file  Other Topics Concern  . Not on file  Social History Narrative   Married, 2 children, 3 grandchildren.   Occupation: retired Financial trader   Education: HS   No tob, minimal alcohol, no drugs.     Outpatient Encounter Medications as of 04/11/2019  Medication Sig  . atorvastatin (LIPITOR) 20 MG tablet TAKE 1 TABLET EVERY DAY  . Cholecalciferol (VITAMIN D3 PO) Take 1 tablet by mouth daily.  Marland Kitchen lisinopril (ZESTRIL) 20 MG tablet TAKE 1 TABLET EVERY DAY  . Zoster Vaccine Adjuvanted [facility]) injection Inject 0.5 mLs into the muscle once for 1 dose.   No facility-administered encounter medications on file as of 04/11/2019.     Activities of Daily Living In your present state of health, do you have any difficulty performing the following activities: 04/11/2019  Hearing? N  Vision? N  Difficulty concentrating or making decisions? N  Walking or climbing stairs? N  Dressing or bathing? N  Doing errands, shopping? N  Preparing Food and eating ? N  Using the Toilet? N  In the past six months, have you accidently leaked urine? N  Do you have problems with loss of bowel control? N  Managing your Medications? N  Managing your Finances? N  Housekeeping or managing your Housekeeping? N  Some recent data might be hidden    Patient Care Team: Tammi Sou, MD as PCP - General (Family Medicine) Pyrtle, Lajuan Lines, MD as  Consulting Physician (Gastroenterology) [facility] Dean, Tonna Corner, MD as Consulting Physician (Orthopedic Surgery) Regal, Tamala Fothergill, DPM as Consulting Physician (Podiatry)   Assessment:   This is a routine wellness examination for Marsean.  Exercise Activities and Dietary recommendations Current Exercise Habits: The patient does not participate in regular exercise at present(Gardening; maintains 2 properties), Exercise limited by: None identified   Diet (meal preparation, eat out, water intake, caffeinated beverages, dairy products, fruits and  vegetables): Drinks milk.   Breakfast: coffee, bagel, cereal Lunch: sandwich Dinner: protein and veggies.   Goals    . Weight (lb) < 210 lb (95.3 kg)     Lose weight by decreasing snacking.     . Weight (lb) < 210 lb (95.3 kg)     Lose weight by decreasing snacking.        Fall Risk Fall Risk  04/11/2019 04/09/2018 09/18/2017 06/14/2017 03/22/2016  Falls in the past year? 0 No No No No  Comment - - - Emmi Telephone Survey: data to providers prior to load -  Number falls in past yr: 0 - - - -  Injury with Fall? 0 - - - -  Follow up Falls prevention discussed - - - -    Depression Screen PHQ 2/9 Scores 04/11/2019 04/09/2018 09/18/2017 03/22/2016  PHQ - 2 Score 0 0 0 1    Cognitive Function MMSE - Mini Mental State Exam 04/11/2019 04/09/2018  Orientation to time 5 5  Orientation to Place 5 5  Registration 3 3  Attention/ Calculation 5 5  Recall 2 1  Language- name 2 objects 2 2  Language- repeat 1 1  Language- follow 3 step command 3 3  Language- read & follow direction 1 1  Write a sentence 1 1  Copy design 1 1  Total score 29 28        Immunization History  Administered Date(s) Administered  . Fluad Quad(high Dose 65+) 04/11/2019  . Influenza Split 07/14/2011, 05/13/2012  . Influenza, High Dose Seasonal PF 04/16/2015, 03/22/2016, 03/21/2017, 04/09/2018  . Influenza,inj,Quad PF,6+ Mos 03/21/2013, 04/10/2014  .  Pneumococcal Conjugate-13 04/10/2014  . Pneumococcal Polysaccharide-23 07/19/2009  . Td 06/17/2009  . Zoster 07/05/2010     Screening Tests Health Maintenance  Topic Date Due  . TETANUS/TDAP  06/18/2019  . INFLUENZA VACCINE  Completed  . PNA vac Low Risk Adult  Completed        Plan:    Shingles vaccine at pharmacy.   Continue doing brain stimulating activities (puzzles, reading, adult coloring books, staying active) to keep memory sharp.   I have personally reviewed and noted the following in the patient's chart:   . Medical and social history . Use of alcohol, tobacco or illicit drugs  . Current medications and supplements . Functional ability and status . Nutritional status . Physical activity . Advanced directives . List of other physicians . Hospitalizations, surgeries, and ER visits in previous 12 months . Vitals . Screenings to include cognitive, depression, and falls . Referrals and appointments  In addition, I have reviewed and discussed with patient certain preventive protocols, quality metrics, and best practice recommendations. A written personalized care plan for preventive services as well as general preventive health recommendations were provided to patient.     Gerilyn Nestle, RN  04/11/2019  PCP Notes: -Pt contemplating left should surgery in the near future.  -Pt c/o left ear "rushing sound" x 2 days, now resolved.  -F/U with PCP 04/18/2019   Medical screening examination/treatment/procedure(s) were performed by non-physician practitioner and as supervising physician I was immediately available for consultation/collaboration.  I agree with above assessment and plan.  Electronically Signed by: Howard Pouch, DO [place] [facility] [place]

## 2019-04-11 ENCOUNTER — Other Ambulatory Visit: Payer: Self-pay

## 2019-04-11 ENCOUNTER — Ambulatory Visit (INDEPENDENT_AMBULATORY_CARE_PROVIDER_SITE_OTHER): Payer: Medicare Other

## 2019-04-11 ENCOUNTER — Ambulatory Visit: Payer: Medicare Other | Admitting: Family Medicine

## 2019-04-11 VITALS — BP 130/64 | HR 67 | Ht 67.0 in | Wt 230.5 lb

## 2019-04-11 DIAGNOSIS — Z23 Encounter for immunization: Secondary | ICD-10-CM | POA: Diagnosis not present

## 2019-04-11 DIAGNOSIS — Z0000 Encounter for general adult medical examination without abnormal findings: Secondary | ICD-10-CM

## 2019-04-11 MED ORDER — SHINGRIX 50 MCG/0.5ML IM SUSR: 0.5000 mL | Freq: Once | INTRAMUSCULAR | 1 refills | Status: AC

## 2019-04-11 NOTE — Patient Instructions (Addendum)
Shingles vaccine at pharmacy.   Continue doing brain stimulating activities (puzzles, reading, adult coloring books, staying active) to keep memory sharp.    Fall Prevention in the Home, Adult Falls can cause injuries. They can happen to people of all ages. There are many things you can do to make your home safe and to help prevent falls. Ask for help when making these changes, if needed. What actions can I take to prevent falls? General Instructions  Use good lighting in all rooms. Replace any light bulbs that burn out.  Turn on the lights when you go into a dark area. Use night-lights.  Keep items that you use often in easy-to-reach places. Lower the shelves around your home if necessary.  Set up your furniture so you have a clear path. Avoid moving your furniture around.  Do not have throw rugs and other things on the floor that can make you trip.  Avoid walking on wet floors.  If any of your floors are uneven, fix them.  Add color or contrast paint or tape to clearly mark and help you see: ? Any grab bars or handrails. ? First and last steps of stairways. ? Where the edge of each step is.  If you use a stepladder: ? Make sure that it is fully opened. Do not climb a closed stepladder. ? Make sure that both sides of the stepladder are locked into place. ? Ask someone to hold the stepladder for you while you use it.  If there are any pets around you, be aware of where they are. What can I do in the bathroom?      Keep the floor dry. Clean up any water that spills onto the floor as soon as it happens.  Remove soap buildup in the tub or shower regularly.  Use non-skid mats or decals on the floor of the tub or shower.  Attach bath mats securely with double-sided, non-slip rug tape.  If you need to sit down in the shower, use a plastic, non-slip stool.  Install grab bars by the toilet and in the tub and shower. Do not use towel bars as grab bars. What can I do in the  bedroom?  Make sure that you have a light by your bed that is easy to reach.  Do not use any sheets or blankets that are too big for your bed. They should not hang down onto the floor.  Have a firm chair that has side arms. You can use this for support while you get dressed. What can I do in the kitchen?  Clean up any spills right away.  If you need to reach something above you, use a strong step stool that has a grab bar.  Keep electrical cords out of the way.  Do not use floor polish or wax that makes floors slippery. If you must use wax, use non-skid floor wax. What can I do with my stairs?  Do not leave any items on the stairs.  Make sure that you have a light switch at the top of the stairs and the bottom of the stairs. If you do not have them, ask someone to add them for you.  Make sure that there are handrails on both sides of the stairs, and use them. Fix handrails that are broken or loose. Make sure that handrails are as long as the stairways.  Install non-slip stair treads on all stairs in your home.  Avoid having throw rugs at the top  or bottom of the stairs. If you do have throw rugs, attach them to the floor with carpet tape.  Choose a carpet that does not hide the edge of the steps on the stairway.  Check any carpeting to make sure that it is firmly attached to the stairs. Fix any carpet that is loose or worn. What can I do on the outside of my home?  Use bright outdoor lighting.  Regularly fix the edges of walkways and driveways and fix any cracks.  Remove anything that might make you trip as you walk through a door, such as a raised step or threshold.  Trim any bushes or trees on the path to your home.  Regularly check to see if handrails are loose or broken. Make sure that both sides of any steps have handrails.  Install guardrails along the edges of any raised decks and porches.  Clear walking paths of anything that might make someone trip, such as tools  or rocks.  Have any leaves, snow, or ice cleared regularly.  Use sand or salt on walking paths during winter.  Clean up any spills in your garage right away. This includes grease or oil spills. What other actions can I take?  Wear shoes that: ? Have a low heel. Do not wear high heels. ? Have rubber bottoms. ? Are comfortable and fit you well. ? Are closed at the toe. Do not wear open-toe sandals.  Use tools that help you move around (mobility aids) if they are needed. These include: ? Canes. ? Walkers. ? Scooters. ? Crutches.  Review your medicines with your doctor. Some medicines can make you feel dizzy. This can increase your chance of falling. Ask your doctor what other things you can do to help prevent falls. Where to find more information  Centers for Disease Control and Prevention, STEADI: https://garcia.biz/  [employer] on Aging: BrainJudge.co.uk Contact a doctor if:  You are afraid of falling at home.  You feel weak, drowsy, or dizzy at home.  You fall at home. Summary  There are many simple things that you can do to make your home safe and to help prevent falls.  Ways to make your home safe include removing tripping hazards and installing grab bars in the bathroom.  Ask for help when making these changes in your home. This information is not intended to replace advice given to you by your health care provider. Make sure you discuss any questions you have with your health care provider. Document Released: 04/22/2009 Document Revised: 10/17/2018 Document Reviewed: 02/08/2017 Elsevier Patient Education  2020 [place] Maintenance, Male Adopting a healthy lifestyle and getting preventive care are important in promoting health and wellness. Ask your health care provider about:  The right schedule for you to have regular tests and exams.  Things you can do on your own to prevent diseases and keep yourself healthy. What should I know  about diet, weight, and exercise? Eat a healthy diet   Eat a diet that includes plenty of vegetables, fruits, low-fat dairy products, and lean protein.  Do not eat a lot of foods that are high in solid fats, added sugars, or sodium. Maintain a healthy weight Body mass index (BMI) is a measurement that can be used to identify possible weight problems. It estimates body fat based on height and weight. Your health care provider can help determine your BMI and help you achieve or maintain a healthy weight. Get regular exercise Get regular exercise.  This is one of the most important things you can do for your health. Most adults should:  Exercise for at least 150 minutes each week. The exercise should increase your heart rate and make you sweat (moderate-intensity exercise).  Do strengthening exercises at least twice a week. This is in addition to the moderate-intensity exercise.  Spend less time sitting. Even light physical activity can be beneficial. Watch cholesterol and blood lipids Have your blood tested for lipids and cholesterol at 78 years of age, then have this test every 5 years. You may need to have your cholesterol levels checked more often if:  Your lipid or cholesterol levels are high.  You are older than 78 years of age.  You are at high risk for heart disease. What should I know about cancer screening? Many types of cancers can be detected early and may often be prevented. Depending on your health history and family history, you may need to have cancer screening at various ages. This may include screening for:  Colorectal cancer.  Prostate cancer.  Skin cancer.  Lung cancer. What should I know about heart disease, diabetes, and high blood pressure? Blood pressure and heart disease  High blood pressure causes heart disease and increases the risk of stroke. This is more likely to develop in people who have high blood pressure readings, are of African descent, or are  overweight.  Talk with your health care provider about your target blood pressure readings.  Have your blood pressure checked: ? Every 3-5 years if you are 36-58 years of age. ? Every year if you are 53 years old or older.  If you are between the ages of 53 and 33 and are a current or former smoker, ask your health care provider if you should have a one-time screening for abdominal aortic aneurysm (AAA). Diabetes Have regular diabetes screenings. This checks your fasting blood sugar level. Have the screening done:  Once every three years after age 63 if you are at a normal weight and have a low risk for diabetes.  More often and at a younger age if you are overweight or have a high risk for diabetes. What should I know about preventing infection? Hepatitis B If you have a higher risk for hepatitis B, you should be screened for this virus. Talk with your health care provider to find out if you are at risk for hepatitis B infection. Hepatitis C Blood testing is recommended for:  Everyone born from [birth year] through 1965.  Anyone with known risk factors for hepatitis C. Sexually transmitted infections (STIs)  You should be screened each year for STIs, including gonorrhea and chlamydia, if: ? You are sexually active and are younger than 78 years of age. ? You are older than 78 years of age and your health care provider tells you that you are at risk for this type of infection. ? Your sexual activity has changed since you were last screened, and you are at increased risk for chlamydia or gonorrhea. Ask your health care provider if you are at risk.  Ask your health care provider about whether you are at high risk for HIV. Your health care provider may recommend a prescription medicine to help prevent HIV infection. If you choose to take medicine to prevent HIV, you should first get tested for HIV. You should then be tested every 3 months for as long as you are taking the medicine. Follow these  instructions at home: Lifestyle  Do not use any products  that contain nicotine or tobacco, such as cigarettes, e-cigarettes, and chewing tobacco. If you need help quitting, ask your health care provider.  Do not use street drugs.  Do not share needles.  Ask your health care provider for help if you need support or information about quitting drugs. Alcohol use  Do not drink alcohol if your health care provider tells you not to drink.  If you drink alcohol: ? Limit how much you have to 0-2 drinks a day. ? Be aware of how much alcohol is in your drink. In the [place] one drink equals one 12 oz bottle of beer (355 mL), one 5 oz glass of wine (148 mL), or one 1 oz glass of hard liquor (44 mL). General instructions  Schedule regular health, dental, and eye exams.  Stay current with your vaccines.  Tell your health care provider if: ? You often feel depressed. ? You have ever been abused or do not feel safe at home. Summary  Adopting a healthy lifestyle and getting preventive care are important in promoting health and wellness.  Follow your health care provider's instructions about healthy diet, exercising, and getting tested or screened for diseases.  Follow your health care provider's instructions on monitoring your cholesterol and blood pressure. This information is not intended to replace advice given to you by your health care provider. Make sure you discuss any questions you have with your health care provider. Document Released: 12/23/2007 Document Revised: 06/19/2018 Document Reviewed: 06/19/2018 Elsevier Patient Education  2020 [employer].

## 2019-04-12 NOTE — Progress Notes (Signed)
AWV reviewed and agree. Signed:  Crissie Sickles, MD           04/12/2019

## 2019-04-18 ENCOUNTER — Ambulatory Visit (INDEPENDENT_AMBULATORY_CARE_PROVIDER_SITE_OTHER): Payer: Medicare Other | Admitting: Family Medicine

## 2019-04-18 ENCOUNTER — Other Ambulatory Visit: Payer: Self-pay

## 2019-04-18 ENCOUNTER — Encounter: Payer: Self-pay | Admitting: Family Medicine

## 2019-04-18 VITALS — BP 125/68 | HR 59 | Temp 98.6°F | Resp 16 | Ht 67.0 in | Wt 229.6 lb

## 2019-04-18 DIAGNOSIS — M19011 Primary osteoarthritis, right shoulder: Secondary | ICD-10-CM | POA: Diagnosis not present

## 2019-04-18 DIAGNOSIS — G8929 Other chronic pain: Secondary | ICD-10-CM

## 2019-04-18 DIAGNOSIS — E7800 Pure hypercholesterolemia, unspecified: Secondary | ICD-10-CM

## 2019-04-18 DIAGNOSIS — I10 Essential (primary) hypertension: Secondary | ICD-10-CM | POA: Diagnosis not present

## 2019-04-18 DIAGNOSIS — M19012 Primary osteoarthritis, left shoulder: Secondary | ICD-10-CM

## 2019-04-18 DIAGNOSIS — M25512 Pain in left shoulder: Secondary | ICD-10-CM

## 2019-04-18 DIAGNOSIS — E669 Obesity, unspecified: Secondary | ICD-10-CM

## 2019-04-18 DIAGNOSIS — K7581 Nonalcoholic steatohepatitis (NASH): Secondary | ICD-10-CM

## 2019-04-18 DIAGNOSIS — M25511 Pain in right shoulder: Secondary | ICD-10-CM

## 2019-04-18 NOTE — Patient Instructions (Addendum)
Orthopedic surgeon contact information- Dr. Meredith Pel: [phone];

## 2019-04-18 NOTE — Progress Notes (Signed)
OFFICE VISIT  04/18/2019   CC:  Chief Complaint  Patient presents with  . Follow-up    RCI, pt is not fasting     HPI:    Patient is a 78 y.o. Caucasian male who presents for 6 mo f/u HTN, HLD, obesity and hx of NASH.  HTN: home bp monitoring low 130s/60s-70s consistently.  HLD: taking statin.  Not dieting any. Yard work/house work, otherwise none.  Obesity, hx of NASH: no attempts at healthy diet and no attempts at any cardiovascular exercise, unfortunately.  He remains poorly motivated to do this.  ROS: no CP, no SOB, no wheezing, no cough, no dizziness, no HAs, no rashes, no melena/hematochezia.  No polyuria or polydipsia.  No myalgias.  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 Says a [place] steroid injection under fluoroscopic guidance 05/17/19 was very helpful. Asks if he should see his orthopedist again.   Past Medical History:  Diagnosis Date  . Diverticulosis 2013   noted on colonoscopy  . Fatty liver 05/2012   u/s done for mild/persistent elevation of LFTs  . Hay fever    no issues now-? out grown  . Hearing impairment    11/2015 Audiology eval= not yet ready for hearing aids ([facility])  . History of adenomatous polyp of colon    On multiple colonoscopies: most recent colonoscopy 05/31/18-->adenoma x 2, repeat 3-5 ys is optional.  . History of basal cell carcinoma of skin   . HTN (hypertension)   . Hyperlipidemia   . Lumbar spondylosis 04/2013   with grade I spondylolisthesis L4 on L5.  . Microhematuria 09/2017   On dipstick only.  Urine micro 09/2017 and 04/2018-->no RBCs.  . OAB (overactive bladder)    ditropan xl 10 ineffective--pt d/c'd this 09/2018  . Obesity   . 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 Rectal leakage     Past Surgical History:  Procedure Laterality Date  . COLONOSCOPY     2008 w/polyps,   09-22-2011 w/polyps.  01/2015 tubular adenoma x 1.  05/31/18 colonoscopy w/ two tubular adenomas.  . COLONOSCOPY W/ POLYPECTOMY  2008; 09/22/11;01/2015   Tubular adenoma--recall 3 yrs per Dr. Louanne Belton at [place].  Then switched to Dr. Hilarie Fredrickson with [place].  Marland Kitchen POLYPECTOMY    . UMBILICAL HERNIA REPAIR  2011    Outpatient Medications Prior to Visit  Medication Sig Dispense Refill  . atorvastatin (LIPITOR) 20 MG tablet TAKE 1 TABLET EVERY DAY 90 tablet 1  . Cholecalciferol (VITAMIN D3 PO) Take 1 tablet by mouth daily.    Marland Kitchen lisinopril (ZESTRIL) 20 MG tablet TAKE 1 TABLET EVERY DAY 90 tablet 1   No facility-administered medications prior to visit.     No Known Allergies  ROS As per HPI  PE: Blood pressure 125/68, pulse (!) 59, temperature 98.6 F (37 C), temperature source Temporal, resp. rate 16, height 5' 7"  (1.702 m), weight 229 lb 9.6 oz (104.1 kg), SpO2 97 %. Gen: Alert, well appearing.  Patient is oriented to person, place, time, and situation. AFFECT: pleasant, lucid thought and speech. CV: RRR, no m/r/g.   LUNGS: CTA bilat, nonlabored resps, good aeration in all lung fields. EXT: no clubbing or cyanosis.  2+ bilat ankle edema.    LABS:  Lab Results  Component Value Date   TSH 1.00 09/29/2013   Lab Results  Component Value Date   WBC 10.8 (H) 07/09/2017   HGB 15.4 07/09/2017   HCT 46.0 07/09/2017   MCV 90.2 07/09/2017   PLT 157 07/09/2017   Lab Results  Component Value Date   CREATININE 1.20 04/09/2018   BUN 23 04/09/2018   NA 137 04/09/2018   K 4.8 04/09/2018   CL 103 04/09/2018   CO2 26 04/09/2018   Lab Results  Component Value Date   ALT 32 04/09/2018   AST 23 04/09/2018   ALKPHOS 68 04/09/2018   BILITOT 0.8 04/09/2018   Lab Results  Component Value Date   CHOL 121 04/09/2018   Lab Results  Component Value Date   HDL 39.50 04/09/2018   Lab Results  Component Value Date   LDLCALC 57 04/09/2018   Lab Results  Component Value Date    TRIG 121.0 04/09/2018   Lab Results  Component Value Date   CHOLHDL 3 04/09/2018   Lab Results  Component Value Date   PSA 0.90 09/18/2017   PSA 0.75 10/14/2014   PSA 0.84 09/29/2013    IMPRESSION AND PLAN:  1) HTN: The current medical regimen is effective;  continue present plan and medications. Encouraged more strict low Na diet. Lytes/cr-ordered future.  2) HLD: tolerating statin. Due for FLP and hepatic panel->ordered future.  3) Hx of NASH + class II obesity: encouraged increase in exercise and more aggressive improvement in diet but he is not really wanting to do this. Monitor hepatic panel-future.  4) Chronic bilat shoulder pain-->Hx of bilat shoulder osteoarthritis-end stage.  Shoulders still bothering him but mainly when he sleeps on them.  I have recommended he contact his orthopedic surgeon to get a recheck-->Orthopedic surgeon contact information- Dr. Meredith Pel: [phone];  An After Visit Summary was printed and given to the patient.  FOLLOW UP: Return in about 6 months (around 10/17/2019) for routine chronic illness f/u.  Signed:  Crissie Sickles, MD           04/18/2019

## 2019-04-23 ENCOUNTER — Other Ambulatory Visit: Payer: Self-pay

## 2019-04-23 ENCOUNTER — Other Ambulatory Visit (INDEPENDENT_AMBULATORY_CARE_PROVIDER_SITE_OTHER): Payer: Medicare Other

## 2019-04-23 ENCOUNTER — Ambulatory Visit: Payer: Medicare Other

## 2019-04-23 DIAGNOSIS — K7581 Nonalcoholic steatohepatitis (NASH): Secondary | ICD-10-CM | POA: Diagnosis not present

## 2019-04-23 DIAGNOSIS — E7800 Pure hypercholesterolemia, unspecified: Secondary | ICD-10-CM | POA: Diagnosis not present

## 2019-04-23 DIAGNOSIS — E669 Obesity, unspecified: Secondary | ICD-10-CM | POA: Diagnosis not present

## 2019-04-23 DIAGNOSIS — I10 Essential (primary) hypertension: Secondary | ICD-10-CM

## 2019-04-23 LAB — LIPID PANEL
Cholesterol: 139 mg/dL (ref 0–200)
HDL: 42.2 mg/dL (ref >39.00)
LDL Cholesterol: 65 mg/dL (ref 0–99)
NonHDL: 97.02
Total CHOL/HDL Ratio: 3
Triglycerides: 160 mg/dL — ABNORMAL HIGH (ref 0.0–149.0)
VLDL: 32 mg/dL (ref 0.0–40.0)

## 2019-04-23 LAB — COMPREHENSIVE METABOLIC PANEL
ALT: 27 U/L (ref 0–53)
AST: 20 U/L (ref 0–37)
Albumin: 4.5 g/dL (ref 3.5–5.2)
Alkaline Phosphatase: 78 U/L (ref 39–117)
BUN: 22 mg/dL (ref 6–23)
CO2: 27 mEq/L (ref 19–32)
Calcium: 9.6 mg/dL (ref 8.4–10.5)
Chloride: 101 mEq/L (ref 96–112)
Creatinine, Ser: 1.09 mg/dL (ref 0.40–1.50)
GFR: 65.42 mL/min (ref >60.00)
Glucose, Bld: 79 mg/dL (ref 70–99)
Potassium: 4.6 mEq/L (ref 3.5–5.1)
Sodium: 138 mEq/L (ref 135–145)
Total Bilirubin: 0.6 mg/dL (ref 0.2–1.2)
Total Protein: 7.1 g/dL (ref 6.0–8.3)

## 2019-06-03 ENCOUNTER — Other Ambulatory Visit: Payer: Self-pay | Admitting: Family Medicine

## 2019-06-16 ENCOUNTER — Telehealth: Payer: Self-pay | Admitting: Family Medicine

## 2019-06-16 ENCOUNTER — Other Ambulatory Visit: Payer: Self-pay

## 2019-06-16 MED ORDER — LISINOPRIL 20 MG PO TABS: 20.0000 mg | ORAL_TABLET | Freq: Every day | ORAL | 0 refills | Status: DC

## 2019-06-16 NOTE — Telephone Encounter (Signed)
Patient states his mail order for Lisinopril is running late. Patient is out of medication. Please send  6 pills Lisinopril 20 MG to [facility].

## 2019-06-16 NOTE — Telephone Encounter (Signed)
Rx sent in for 6 day supply. MyChart message sent notifying patient.

## 2019-06-21 ENCOUNTER — Other Ambulatory Visit: Payer: Self-pay | Admitting: Family Medicine

## 2019-07-14 ENCOUNTER — Other Ambulatory Visit: Payer: Self-pay

## 2019-07-14 ENCOUNTER — Encounter: Payer: Self-pay | Admitting: Podiatry

## 2019-07-14 ENCOUNTER — Ambulatory Visit (INDEPENDENT_AMBULATORY_CARE_PROVIDER_SITE_OTHER): Payer: Medicare Other | Admitting: Podiatry

## 2019-07-14 VITALS — Temp 97.7°F

## 2019-07-14 DIAGNOSIS — M79674 Pain in right toe(s): Secondary | ICD-10-CM | POA: Diagnosis not present

## 2019-07-14 DIAGNOSIS — B351 Tinea unguium: Secondary | ICD-10-CM | POA: Diagnosis not present

## 2019-07-14 DIAGNOSIS — M79675 Pain in left toe(s): Secondary | ICD-10-CM

## 2019-07-14 NOTE — Patient Instructions (Signed)

## 2019-07-14 NOTE — Progress Notes (Signed)
Subjective: Gregory Blevins is seen today for follow up painful, elongated, thickened toenails bilateral feet that he cannot cut. Pain interferes with daily activities. Aggravating factor includes wearing enclosed shoe gear and relieved with periodic debridement.    Patient would like to know if getting a pedicure between visits is ok. His wife cuts his nails between visits.  Medications reviewed in chart.  No Known Allergies   Objective:  Vascular Examination: Capillary refill time <4 seconds b/l.  Dorsalis pedis diminished b/l.  Posterior tibial pulses diminished b/l.  Digital hair sparse b/l.  Skin temperature gradient warm to cool b/l.  Dermatological Examination: Skin is thin, shiny and atrophic b/l.  Toenails 1-5 b/l discolored, thick, dystrophic with subungual debris and pain with palpation to nailbeds due to thickness of nails.  Musculoskeletal: Muscle strength 5/5 to all LE muscle groups b/l.  No gross bony deformities b/l.  No pain, crepitus or joint limitation noted with ROM.   Neurological Examination: Protective sensation intact with 10 gram monofilament bilaterally.  Assessment: Painful onychomycosis toenails 1-5 b/l   Plan: 1. Discussed dangers of pedicures with patient. In the event he decides to get pedicures, I advised him to take his own instruments. Do not allow anyone to use sharp blades on his feet. 2. Toenails 1-5 b/l were debrided in length and girth without iatrogenic bleeding. 3. Patient to continue soft, supportive shoe gear daily. 4. Patient to report any pedal injuries to medical professional immediately. 5. Follow up 3 months.  6. Patient/POA to call should there be a concern in the interim.

## 2019-08-13 LAB — VITAMIN D 25 HYDROXY (VIT D DEFICIENCY, FRACTURES): Vit D, 25-Hydroxy: 61.15

## 2019-08-13 LAB — TSH: TSH: 1.23 (ref 0.41–5.90)

## 2019-08-13 LAB — HEMOGLOBIN A1C: Hemoglobin A1C: 6.1

## 2019-08-25 ENCOUNTER — Encounter: Payer: Self-pay | Admitting: Family Medicine

## 2019-09-01 ENCOUNTER — Telehealth: Payer: Self-pay

## 2019-09-01 ENCOUNTER — Other Ambulatory Visit: Payer: Self-pay

## 2019-09-01 MED ORDER — ATORVASTATIN CALCIUM 20 MG PO TABS: 20.0000 mg | ORAL_TABLET | Freq: Every day | ORAL | 0 refills | Status: DC

## 2019-09-01 MED ORDER — LISINOPRIL 20 MG PO TABS: 20.0000 mg | ORAL_TABLET | Freq: Every day | ORAL | 0 refills | Status: DC

## 2019-09-01 NOTE — Telephone Encounter (Signed)
Patient advised and voiced understanding.  

## 2019-09-01 NOTE — Telephone Encounter (Signed)
Patient needs new prescription - using new pharmacy  [facility]  [facility] [place]  Patient can be reached at [phone].

## 2019-09-01 NOTE — Telephone Encounter (Signed)
Pharmacy updated, RF sent. MyChart message sent notifying patient.

## 2019-09-03 ENCOUNTER — Encounter: Payer: Self-pay | Admitting: Family Medicine

## 2019-09-12 DIAGNOSIS — D225 Melanocytic nevi of trunk: Secondary | ICD-10-CM | POA: Diagnosis not present

## 2019-09-12 DIAGNOSIS — L821 Other seborrheic keratosis: Secondary | ICD-10-CM | POA: Diagnosis not present

## 2019-09-12 DIAGNOSIS — L570 Actinic keratosis: Secondary | ICD-10-CM | POA: Diagnosis not present

## 2019-09-12 DIAGNOSIS — D229 Melanocytic nevi, unspecified: Secondary | ICD-10-CM | POA: Diagnosis not present

## 2019-09-12 DIAGNOSIS — L720 Epidermal cyst: Secondary | ICD-10-CM | POA: Diagnosis not present

## 2019-09-18 ENCOUNTER — Other Ambulatory Visit: Payer: Self-pay | Admitting: Family Medicine

## 2019-09-18 ENCOUNTER — Telehealth: Payer: Self-pay

## 2019-09-18 NOTE — Telephone Encounter (Signed)
Error

## 2019-09-19 ENCOUNTER — Other Ambulatory Visit: Payer: Self-pay

## 2019-09-19 ENCOUNTER — Ambulatory Visit (INDEPENDENT_AMBULATORY_CARE_PROVIDER_SITE_OTHER): Payer: Medicare Other | Admitting: Family Medicine

## 2019-09-19 ENCOUNTER — Encounter: Payer: Self-pay | Admitting: Family Medicine

## 2019-09-19 VITALS — BP 129/76 | HR 67 | Temp 98.6°F | Resp 16 | Ht 67.0 in | Wt 233.0 lb

## 2019-09-19 DIAGNOSIS — H9313 Tinnitus, bilateral: Secondary | ICD-10-CM

## 2019-09-19 DIAGNOSIS — H9193 Unspecified hearing loss, bilateral: Secondary | ICD-10-CM | POA: Diagnosis not present

## 2019-09-19 NOTE — Progress Notes (Signed)
See med student Niel Hummer [place]'s note from today.  Signed:  Crissie Sickles, MD           09/19/2019

## 2019-09-19 NOTE — Progress Notes (Signed)
CC: Ear ringing in both ears  HPI: "rushing sound" now but ringing  Hearing a bit attenutated   Comes and goes, getting a bit worse  Either or both  On and off for the past 10 years  Ear washing helps the ringing go away - doc in [place]   L Shoulder pains from osteoarthritis  -needs surgery  -sleeps on, hurts, gets better in 20-60 min or so of waking up  -still physically active - yard work, etc  PMH:  Hearing impairment - had audiology eval 11/2015 but then not ready for hearing aids  Hypertension, hyperlipidemia both controlled with atorvastatin and lisinopril  Taking cholecalciferol for low Vit D  Up to date on vaccines See past note for more extensive PMH   PSH:  Colonoscopy (2008, 2013, 1027, 2536)  Umbilical hernia repair (2011)  M/A: NKDA or food allergies  Taking atorvastatin 2m qd po, cholecalciferol once qd po, lisinopril 219monce qd po  FH: Brother has reduced hearing after perforating his ear drum with QTip  SH: N/A  ROS:  arthritis in hips, no fever, chills, nausea, vomiting, diarrhea, constipation, change to vision or sense of smell or taste, no myalgias, no chest pain, palpitations, SOB, or DOE, no change to appetite, no night sweats, no changes to memory   PE:  Vitals: 129/76 BP, 67 bpm, 16 RR, 37 C  CV: RRR, no m/r/g, normal S1 and S2, 2+ pulses bilaterally  Pulm/chest: CTAB, equal respiratory expansion CVA tenderness HEENT:  Head: normocephalic without abrasions  Ears: L ear with dark yellow/red, hard cerumen build up, tympanic membranes clear bilaterally Eyes: PERRLA, regular EOM Neck: neck supple, trachea midline, no lymphadenopathy  Throat: not done   A/P: RoKerry Blevins a [age]o man with a PMH of tinnitus, hyperlipidemia, and HTN who presents today with on and off ringing in both ears which has worsened in recent months. On physical exam there is hard yellow cerumen impacted and unable to remove wit curettage. Nurse irrigated L EAC and completely  cleared cerumen out.  TM and EAC normal.  Suspect his increased tinnitus is secondary to gradually worsening sensorineural hearing loss.  Referred back to audiology for further testing.    ADDENDUM: I saw pt today, discussed with Gregory Blevins, agree with entire note above. Signed:  PhCrissie SicklesMD           09/19/2019

## 2019-09-22 ENCOUNTER — Encounter: Payer: Self-pay | Admitting: Family Medicine

## 2019-09-22 MED ORDER — CELECOXIB 200 MG PO CAPS: 200.0000 mg | ORAL_CAPSULE | Freq: Every day | ORAL | 6 refills | Status: DC

## 2019-09-22 NOTE — Telephone Encounter (Signed)
I'll sent in generic celebrex

## 2019-09-22 NOTE — Telephone Encounter (Signed)
Pt was seen on Friday 3/12 and had his left ear cleaned out and referral for audiology for tinnitus. Pt is asking for med until then.  Please advise, thanks.

## 2019-10-13 ENCOUNTER — Encounter: Payer: Self-pay | Admitting: Podiatry

## 2019-10-13 ENCOUNTER — Ambulatory Visit (INDEPENDENT_AMBULATORY_CARE_PROVIDER_SITE_OTHER): Payer: Medicare Other | Admitting: Podiatry

## 2019-10-13 ENCOUNTER — Other Ambulatory Visit: Payer: Self-pay

## 2019-10-13 DIAGNOSIS — M79674 Pain in right toe(s): Secondary | ICD-10-CM

## 2019-10-13 DIAGNOSIS — B351 Tinea unguium: Secondary | ICD-10-CM | POA: Diagnosis not present

## 2019-10-13 DIAGNOSIS — M79675 Pain in left toe(s): Secondary | ICD-10-CM

## 2019-10-13 NOTE — Progress Notes (Signed)
Subjective: Gregory Blevins presents today for follow up of painful mycotic nails b/l that are difficult to trim. Pain interferes with ambulation. Aggravating factors include wearing enclosed shoe gear. Pain is relieved with periodic professional debridement.   No Known Allergies   Objective: There were no vitals filed for this visit.  Pt 79 y.o. year old Caucasian male  in NAD. AAO x 3.   Vascular Examination:  Capillary refill time to digits <4 seconds b/l. Faintly palpable DP pulses b/l. Faintly palpable PT pulses b/l. Pedal hair present b/l. Skin temperature gradient warm to cool b/l.  Dermatological Examination: Pedal skin is thin shiny, atrophic bilaterally. No open wounds bilaterally. No interdigital macerations bilaterally. Toenails 1-5 b/l elongated, dystrophic, thickened, crumbly with subungual debris and tenderness to dorsal palpation.   Incurvated nailplate left great toe medial border(s) with tenderness to palpation. No erythema, no edema, no drainage noted.  Musculoskeletal: Normal muscle strength 5/5 to all lower extremity muscle groups bilaterally, no gross bony deformities bilaterally and no pain crepitus or joint limitation noted with ROM b/l  Neurological: Protective sensation intact 5/5 intact bilaterally with 10g monofilament b/l Vibratory sensation intact b/l  Assessment: 1. Pain due to onychomycosis of toenails of both feet    Plan: -Toenails 1-5 b/l were debrided in length and girth with sterile nail nippers and dremel without iatrogenic bleeding. Offending nail border debrided and curretaged left hallux. Border cleansed with alcohol and triple antibiotic applied. Advised him to put Neosporin on digit once daily for 3 days. Also advised him procedure may be necessary in future if ingrown toenail becomes chronic problem. He related understanding. -Patient to continue soft, supportive shoe gear daily. -Patient to report any pedal injuries to medical professional  immediately. -Patient/POA to call should there be question/concern in the interim.  Return in about 3 months (around 01/12/2020) for nail trim.

## 2019-10-13 NOTE — Patient Instructions (Signed)

## 2019-10-15 ENCOUNTER — Other Ambulatory Visit: Payer: Self-pay

## 2019-10-16 ENCOUNTER — Encounter: Payer: Self-pay | Admitting: Family Medicine

## 2019-10-16 ENCOUNTER — Ambulatory Visit (INDEPENDENT_AMBULATORY_CARE_PROVIDER_SITE_OTHER): Payer: Medicare Other | Admitting: Family Medicine

## 2019-10-16 VITALS — BP 128/72 | HR 59 | Temp 97.7°F | Resp 16 | Ht 67.0 in | Wt 235.2 lb

## 2019-10-16 DIAGNOSIS — M19012 Primary osteoarthritis, left shoulder: Secondary | ICD-10-CM

## 2019-10-16 DIAGNOSIS — R7303 Prediabetes: Secondary | ICD-10-CM | POA: Diagnosis not present

## 2019-10-16 DIAGNOSIS — H9313 Tinnitus, bilateral: Secondary | ICD-10-CM

## 2019-10-16 DIAGNOSIS — H9193 Unspecified hearing loss, bilateral: Secondary | ICD-10-CM

## 2019-10-16 DIAGNOSIS — K760 Fatty (change of) liver, not elsewhere classified: Secondary | ICD-10-CM | POA: Diagnosis not present

## 2019-10-16 DIAGNOSIS — I10 Essential (primary) hypertension: Secondary | ICD-10-CM | POA: Diagnosis not present

## 2019-10-16 DIAGNOSIS — E7800 Pure hypercholesterolemia, unspecified: Secondary | ICD-10-CM | POA: Diagnosis not present

## 2019-10-16 DIAGNOSIS — M19011 Primary osteoarthritis, right shoulder: Secondary | ICD-10-CM

## 2019-10-16 LAB — COMPREHENSIVE METABOLIC PANEL
ALT: 31 U/L (ref 0–53)
AST: 21 U/L (ref 0–37)
Albumin: 4.2 g/dL (ref 3.5–5.2)
Alkaline Phosphatase: 72 U/L (ref 39–117)
BUN: 30 mg/dL — ABNORMAL HIGH (ref 6–23)
CO2: 26 mEq/L (ref 19–32)
Calcium: 8.8 mg/dL (ref 8.4–10.5)
Chloride: 107 mEq/L (ref 96–112)
Creatinine, Ser: 1.13 mg/dL (ref 0.40–1.50)
GFR: 62.68 mL/min (ref >60.00)
Glucose, Bld: 80 mg/dL (ref 70–99)
Potassium: 4.8 mEq/L (ref 3.5–5.1)
Sodium: 140 mEq/L (ref 135–145)
Total Bilirubin: 0.6 mg/dL (ref 0.2–1.2)
Total Protein: 6.5 g/dL (ref 6.0–8.3)

## 2019-10-16 MED ORDER — LISINOPRIL 30 MG PO TABS: 30.0000 mg | ORAL_TABLET | Freq: Every day | ORAL | 1 refills | Status: DC

## 2019-10-16 NOTE — Progress Notes (Signed)
OFFICE VISIT  10/16/2019   CC:  Chief Complaint  Patient presents with  . Follow-up    RCI. pt is not fasting   HPI:    Patient is a 79 y.o. Caucasian male who presents for 6 mo f/u HTN, HLD, obesity and hx of NASH. A/P as of last visit: "1) HTN: The current medical regimen is effective;  continue present plan and medications. Encouraged more strict low Na diet. Lytes/cr-ordered future.  2) HLD: tolerating statin. Due for FLP and hepatic panel->ordered future.  3) Hx of NASH + class II obesity: encouraged increase in exercise and more aggressive improvement in diet but he is not really wanting to do this. Monitor hepatic panel-future.  4) Chronic bilat shoulder pain-->Hx of bilat shoulder osteoarthritis-end stage.  Shoulders still bothering him but mainly when he sleeps on them.  I have recommended he contact his orthopedic surgeon to get a recheck-->Orthopedic surgeon contact information- Dr. Meredith Pel: [phone];"  Interim hx:  Still with tinnitus issues, impaired hearing, has been chronic.  He still has not seen his orthopedic surgeon again but plans on doing this b/c L shoulder still giving him pain problems, has signif GH arthritis. Taking celebrex for this and it does help--mostly has night time sx's b/c has to lie on the shoulder while sleeping.  He went to [place] last week to visit his daughter. He got covid vaccine.  HTN: avg 263 systolic, diast avg 70 (6 total checks at home in the last 6 mo).  Prediab: A1c 6.1% Feb 2021: pt has since decreased portion sizes, eating healthier overall. Started gardening again with warm weather lately.  No formal exercise.  HLD: tolerating statin fine.  ROS: no fevers, no CP, no SOB, no wheezing, no cough, no dizziness, no HAs, no rashes, no melena/hematochezia.  No polyuria or polydipsia.  No myalgias or arthralgias.  No focal weakness, paresthesias, or tremors.  No acute vision or hearing abnormalities. No n/v/d or abd  pain.  No palpitations.     Past Medical History:  Diagnosis Date  . Diverticulosis 2013   noted on colonoscopy  . Fatty liver 05/2012   u/s done for mild/persistent elevation of LFTs  . Hay fever    no issues now-? out grown  . Hearing impairment    11/2015 Audiology eval= not yet ready for hearing aids ([facility])  . History of adenomatous polyp of colon    On multiple colonoscopies: most recent colonoscopy 05/31/18-->adenoma x 2, repeat 3-5 ys is optional.  . History of basal cell carcinoma of skin   . HTN (hypertension)   . Hyperlipidemia   . Lumbar spondylosis 04/2013   with grade I spondylolisthesis L4 on L5.  . Microhematuria 09/2017   On dipstick only.  Urine micro 09/2017 and 04/2018-->no RBCs.  . OAB (overactive bladder)    ditropan xl 10 ineffective--pt d/c'd this 09/2018  . Obesity   . 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 Prediabetes    A1c 6.1% 08/2019 at [facility]  . Rectal leakage     Past Surgical History:  Procedure Laterality Date  . COLONOSCOPY     2008 w/polyps,  09-22-2011 w/polyps.  01/2015 tubular adenoma x 1.  05/31/18 colonoscopy w/ two tubular adenomas.  . COLONOSCOPY W/ POLYPECTOMY  2008; 09/22/11;01/2015   Tubular adenoma--recall 3 yrs per Dr. Louanne Belton at [place].  Then switched to Dr.  Pyrtle with [facility].  Marland Kitchen POLYPECTOMY    . UMBILICAL HERNIA REPAIR  2011    Outpatient Medications Prior to Visit  Medication Sig Dispense Refill  . atorvastatin (LIPITOR) 20 MG tablet Take 1 tablet (20 mg total) by mouth daily. 90 tablet 0  . celecoxib (CELEBREX) 200 MG capsule Take 1 capsule (200 mg total) by mouth daily. 30 capsule 6  . Cholecalciferol (VITAMIN D3 PO) Take 1 tablet by mouth daily.    Marland Kitchen lisinopril (ZESTRIL) 20 MG tablet TAKE 1 TABLET BY MOUTH EVERY DAY 30 tablet 0   No facility-administered medications prior to visit.    No Known  Allergies  ROS As per HPI  PE: Blood pressure 128/72, pulse (!) 59, temperature 97.7 F (36.5 C), temperature source Temporal, resp. rate 16, height 5' 7"  (1.702 m), weight 235 lb 3.2 oz (106.7 kg), SpO2 98 %. Body mass index is 36.84 kg/m.  Gen: Alert, well appearing.  Patient is oriented to person, place, time, and situation. AFFECT: pleasant, lucid thought and speech. CV: RRR, no m/r/g.   LUNGS: CTA bilat, nonlabored resps, good aeration in all lung fields. EXT: no clubbing or cyanosis.  2+ pitting edema R LL, and 1+ pitting edema L LL.    LABS:    Chemistry      Component Value Date/Time   NA 138 04/23/2019 0837   K 4.6 04/23/2019 0837   CL 101 04/23/2019 0837   CO2 27 04/23/2019 0837   BUN 22 04/23/2019 0837   CREATININE 1.09 04/23/2019 0837      Component Value Date/Time   CALCIUM 9.6 04/23/2019 0837   ALKPHOS 78 04/23/2019 0837   AST 20 04/23/2019 0837   ALT 27 04/23/2019 0837   BILITOT 0.6 04/23/2019 0837     Lab Results  Component Value Date   WBC 10.8 (H) 07/09/2017   HGB 15.4 07/09/2017   HCT 46.0 07/09/2017   MCV 90.2 07/09/2017   PLT 157 07/09/2017   Lab Results  Component Value Date   CHOL 139 04/23/2019   HDL 42.20 04/23/2019   LDLCALC 65 04/23/2019   LDLDIRECT 76.0 03/22/2016   TRIG 160.0 (H) 04/23/2019   CHOLHDL 3 04/23/2019   Lab Results  Component Value Date   HGBA1C 6.1 08/13/2019    IMPRESSION AND PLAN:  1) HTN: not ideal control systolic.  Increase lisinopril from 68m to 326mqd. Continue periodic home monitoring.  2) HLD: tolerating statin.  Lipid panel 6 mo ago at goal. Plan repeat 6 mo.  3) Fatty liver, obesity: needs to increase activity/exercise.  Continue with dietary changes.  4) [facility] Has made some mild dietary changes since.  Plan repeat A1c 6 mo.  5) Bilat shoulder osteoarthritis: L side giving him chronic trouble and he plans on contacting his ortho surgeon for further  eval-->possible GHDunbarteroid injection vs discuss surgery.  6) Tinnitis and impaired hearing: stable. After covid crisis over he may need to return for repeat audiologic testing.  An After Visit Summary was printed and given to the patient.  FOLLOW UP: Return in about 6 months (around 04/16/2020) for routine chronic illness f/u.  Signed:  PhCrissie SicklesMD           10/16/2019

## 2019-10-17 ENCOUNTER — Telehealth: Payer: Self-pay

## 2019-10-17 ENCOUNTER — Encounter: Payer: Self-pay | Admitting: Family Medicine

## 2019-10-17 ENCOUNTER — Other Ambulatory Visit: Payer: Self-pay

## 2019-10-17 ENCOUNTER — Ambulatory Visit (INDEPENDENT_AMBULATORY_CARE_PROVIDER_SITE_OTHER): Payer: Medicare Other | Admitting: Family Medicine

## 2019-10-17 VITALS — BP 132/74 | HR 67 | Temp 99.0°F | Resp 16 | Ht 67.0 in | Wt 235.2 lb

## 2019-10-17 DIAGNOSIS — S30862A Insect bite (nonvenomous) of penis, initial encounter: Secondary | ICD-10-CM

## 2019-10-17 DIAGNOSIS — W57XXXA Bitten or stung by nonvenomous insect and other nonvenomous arthropods, initial encounter: Secondary | ICD-10-CM | POA: Diagnosis not present

## 2019-10-17 NOTE — Telephone Encounter (Signed)
Patient requesting same day appointment. Patient thinks he has a tick bite on his penis.

## 2019-10-17 NOTE — Telephone Encounter (Signed)
Pt scheduled for in office today at 4pm.

## 2019-10-17 NOTE — Patient Instructions (Signed)
Buy generic over the counter hydrocortisone ointment and apply to itchy area 3-4 times per day as needed for itching.

## 2019-10-17 NOTE — Progress Notes (Signed)
OFFICE VISIT  10/17/2019   CC:  Chief Complaint  Patient presents with  . Tick bite    first noticed 1.5 weeks ago   HPI:    Patient is a 79 y.o. Caucasian male who presents for tick bite. Noted a little bump on side of penis about 1.5 wks ago, no itching initially but last 6-7d started to itch a little and this morning much more itching.  He says the little bump has grown in size some. Doesn't recall any insect/tick bite. He has felt well lately, w/out fever, HA, neck ache or stiffness, or any body aches or rash.  No fatigue/malaise.  Past Medical History:  Diagnosis Date  . Diverticulosis 2013   noted on colonoscopy  . Fatty liver 05/2012   u/s done for mild/persistent elevation of LFTs  . Hay fever    no issues now-? out grown  . Hearing impairment    11/2015 Audiology eval= not yet ready for hearing aids ([facility])  . History of adenomatous polyp of colon    On multiple colonoscopies: most recent colonoscopy 05/31/18-->adenoma x 2, repeat 3-5 ys is optional.  . History of basal cell carcinoma of skin   . HTN (hypertension)   . Hyperlipidemia   . Lumbar spondylosis 04/2013   with grade I spondylolisthesis L4 on L5.  . Microhematuria 09/2017   On dipstick only.  Urine micro 09/2017 and 04/2018-->no RBCs.  . OAB (overactive bladder)    ditropan xl 10 ineffective--pt d/c'd this 09/2018  . Obesity   . 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 Prediabetes    A1c 6.[facility]% 08/2019 [facility]  . Rectal leakage     Past Surgical History:  Procedure Laterality Date  . COLONOSCOPY     2008 w/polyps,  09-22-2011 w/polyps.  01/2015 tubular adenoma x 1.  05/31/18 colonoscopy w/ two tubular adenomas.  . COLONOSCOPY W/ POLYPECTOMY  2008; 09/22/11;01/2015   Tubular adenoma--recall 3 yrs per Dr. Louanne Belton at [facility].  Then switched to Dr. Hilarie Fredrickson with [facility].  Marland Kitchen POLYPECTOMY    . UMBILICAL  HERNIA REPAIR  2011    Outpatient Medications Prior to Visit  Medication Sig Dispense Refill  . atorvastatin (LIPITOR) 20 MG tablet Take 1 tablet (20 mg total) by mouth daily. 90 tablet 0  . celecoxib (CELEBREX) 200 MG capsule Take 1 capsule (200 mg total) by mouth daily. 30 capsule 6  . Cholecalciferol (VITAMIN D3 PO) Take 1 tablet by mouth daily.    Marland Kitchen lisinopril (ZESTRIL) 30 MG tablet Take 1 tablet (30 mg total) by mouth daily. 90 tablet 1   No facility-administered medications prior to visit.    No Known Allergies  ROS As per HPI  PE: Blood pressure 132/74, pulse 67, temperature 99 F (37.2 C), temperature source Temporal, resp. rate 16, height 5' 7"  (1.702 m), weight 235 lb 3.2 oz (106.7 kg), SpO2 93 %. Gen: Alert, well appearing.  Patient is oriented to person, place, time, and situation. AFFECT: pleasant, lucid thought and speech. GU: medium sized tick (adult) attached firmly at intersection of the base of penis and the scrotum, attached fairly deep with no significant surrounding erythema but a bit of edematous skin/sub Q tissue is palpable in the 2 cm vicinity.  The tick has a white dot on its back.  No inguinal/GU rash and no LAD palpable.  LABS:    Chemistry  Component Value Date/Time   NA 140 10/16/2019 0959   K 4.8 10/16/2019 0959   CL 107 10/16/2019 0959   CO2 26 10/16/2019 0959   BUN 30 (H) 10/16/2019 0959   CREATININE 1.13 10/16/2019 0959      Component Value Date/Time   CALCIUM 8.8 10/16/2019 0959   ALKPHOS 72 10/16/2019 0959   AST 21 10/16/2019 0959   ALT 31 10/16/2019 0959   BILITOT 0.6 10/16/2019 0959      IMPRESSION AND PLAN:  Adult lonestar tick attached to base of penis.  Suspect this tick has been there a few days at least, maybe longer. Removed today with tweezers.  The head of the tick remained partially buried but I was able to gently probe with an 18 gauge needle and bring this out.  Pt tolerated this fine.  No bleeding. Notified pt that  this tick species is not known to carry lyme dz.  I told him that if he starts to develop any HA, fever, neck pain, body aches, fatigue, or rash then he needs to come back.  For mild local inflamm response and itching he can get otc hydrocortisone ointment and apply qid prn.  An After Visit Summary was printed and given to the patient.  FOLLOW UP: No follow-ups on file.  Signed:  Crissie Sickles, MD           10/17/2019

## 2019-10-20 ENCOUNTER — Ambulatory Visit: Payer: Medicare Other | Admitting: Family Medicine

## 2019-10-29 ENCOUNTER — Other Ambulatory Visit: Payer: Self-pay

## 2019-10-29 ENCOUNTER — Ambulatory Visit: Payer: Self-pay

## 2019-10-29 ENCOUNTER — Encounter: Payer: Self-pay | Admitting: Orthopedic Surgery

## 2019-10-29 ENCOUNTER — Ambulatory Visit (INDEPENDENT_AMBULATORY_CARE_PROVIDER_SITE_OTHER): Payer: Medicare Other

## 2019-10-29 ENCOUNTER — Ambulatory Visit (INDEPENDENT_AMBULATORY_CARE_PROVIDER_SITE_OTHER): Payer: Medicare Other | Admitting: Orthopedic Surgery

## 2019-10-29 DIAGNOSIS — G8929 Other chronic pain: Secondary | ICD-10-CM

## 2019-10-29 DIAGNOSIS — M25512 Pain in left shoulder: Secondary | ICD-10-CM | POA: Diagnosis not present

## 2019-10-29 DIAGNOSIS — M25511 Pain in right shoulder: Secondary | ICD-10-CM

## 2019-10-30 ENCOUNTER — Encounter: Payer: Self-pay | Admitting: Orthopedic Surgery

## 2019-10-30 ENCOUNTER — Ambulatory Visit (INDEPENDENT_AMBULATORY_CARE_PROVIDER_SITE_OTHER): Payer: Medicare Other | Admitting: Physical Medicine and Rehabilitation

## 2019-10-30 ENCOUNTER — Encounter: Payer: Self-pay | Admitting: Physical Medicine and Rehabilitation

## 2019-10-30 ENCOUNTER — Ambulatory Visit: Payer: Self-pay

## 2019-10-30 DIAGNOSIS — G8929 Other chronic pain: Secondary | ICD-10-CM | POA: Diagnosis not present

## 2019-10-30 DIAGNOSIS — M25512 Pain in left shoulder: Secondary | ICD-10-CM

## 2019-10-30 NOTE — Progress Notes (Signed)
Office Visit Note   Patient: Gregory Blevins           Date of Birth: 1941-05-09           MRN: [id number] Visit Date: 10/29/2019 Requested by: Tammi Sou, MD [address] [place],  [place] [address] PCP: Tammi Sou, MD  Subjective: Chief Complaint  Patient presents with  . Shoulder Pain    HPI: Gregory Blevins is a patient with bilateral shoulder mild pain.  He said pain in both shoulders for few years.  Denies any history of injury.  He denies much in the way of functional decrease in range of motion.  Still he is doing work at home.  Denies any radicular symptoms.  No numbness and tingling.  No neck pain.  Had previous injection in the right shoulder with good relief.  Tried Celebrex but that did not give him good relief.  His main question today is whether or not the shoulder pain could be causing the ringing in his ears which is his primary area of concern.  I have not seen that association before between shoulder arthritis and ear ringing.              ROS: All systems reviewed are negative as they relate to the chief complaint within the history of present illness.  Patient denies  fevers or chills.   Assessment & Plan: Visit Diagnoses:  1. Chronic left shoulder pain   2. Chronic right shoulder pain     Plan: Impression is severe end-stage glenohumeral arthritis with medialization of the humeral head relative to the acromion.  Although the radiographic appearance is severe his functional and clinical presentation is significantly less severe.  He would like to try an injection into the left shoulder to see if that could help with some of the mild discomfort he is having as well as potentially some of the ringing in his ears.  We will get that arranged for him within the next 1 to 2 days with Dr. Ernestina Patches to be done under fluoroscopic guidance.  Follow-up with me as needed.  Follow-Up Instructions: Return if symptoms worsen or fail to improve.   Orders:  Orders Placed  This Encounter  Procedures  . XR Shoulder Right  . XR Shoulder Left   No orders of the defined types were placed in this encounter.     Procedures: No procedures performed   Clinical Data: No additional findings.  Objective: Vital Signs: There were no vitals taken for this visit.  Physical Exam:   Constitutional: Patient appears well-developed HEENT:  Head: Normocephalic Eyes:EOM are normal Neck: Normal range of motion Cardiovascular: Normal rate Pulmonary/chest: Effort normal Neurologic: Patient is alert Skin: Skin is warm Psychiatric: Patient has normal mood and affect    Ortho Exam: Ortho exam demonstrates good cervical spine range of motion.  5 out of 5 grip EPL FPL interosseous wrist extra extension bicep triceps and deltoid strength.  He does have limited passive range of motion as well as some weakness to infraspinatus testing bilaterally.  He does not have too much pain with range of motion passively or actively in the shoulder joint.  Radial pulse intact bilaterally.  Specialty Comments:  No specialty comments available.  Imaging: XR Shoulder Left  Result Date: 10/30/2019 AP outlet axillary left shoulder reviewed.  Severe end-stage glenohumeral arthritis is present with some narrowing of the acromiohumeral interspace.  There is also medialization of the humeral head relative to the  lateral aspect of the acromion.  No acute fracture.  Visualized lung fields clear.  XR Shoulder Right  Result Date: 10/30/2019 AP outlet axillary right shoulder reviewed.  Severe end-stage glenohumeral arthritis is present with mild narrowing of the acromiohumeral distance.  There is some medialization of the humerus relative to the lateral aspect of the acromion.  This is not a severe on the right side as it is on the left side.  No acute fracture.  Visualized lung fields clear.    PMFS History: Patient Active Problem List   Diagnosis Date Noted  . Prostate cancer screening  10/21/2015  . Preventative health care 04/10/2014  . 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 Pelvic pain in male 03/21/2013  . Health maintenance examination 09/27/2012  . HTN (hypertension), benign 09/27/2012  . Basal cell carcinoma of skin 07/12/2011  . Colon polyp 07/12/2011  . High risk medication use 07/12/2011  . Elevation of level of transaminase or lactic acid dehydrogenase (LDH) 08/31/2010  . Onychomycosis due to dermatophyte 08/31/2010  . Osteoarthrosis involving lower leg 08/31/2010  . Other and unspecified hyperlipidemia 08/31/2010  . 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 Myalgia and myositis, unspecified 10/19/2009  . 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 Morbid obesity ([place]) 06/17/2009  . Night blindness 06/17/2009   Past Medical History:  Diagnosis Date  . Diverticulosis 2013   noted on colonoscopy  . Fatty liver 05/2012   u/s done for mild/persistent elevation of LFTs  . Hay fever    no issues now-? out grown  . Hearing impairment    11/2015 Audiology eval= not yet ready for hearing aids ([facility])  . History of adenomatous polyp of colon    On multiple colonoscopies: most recent colonoscopy 05/31/18-->adenoma x 2, repeat 3-5 ys is optional.  . History of basal cell carcinoma of skin   . HTN (hypertension)   . Hyperlipidemia   . Lumbar spondylosis 04/2013   with grade I spondylolisthesis L4 on L5.  . Microhematuria 09/2017   On dipstick only.  Urine micro 09/2017 and 04/2018-->no RBCs.  . OAB (overactive bladder)    ditropan xl 10 ineffective--pt d/c'd this 09/2018  . Obesity   . 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 Prediabetes    A1c 6.1% 08/2019 at [facility]  . Rectal leakage     Family History  Problem Relation Age of Onset  . Hypertension Father   . Alcoholism Father   . Stomach cancer Father        primary with  mets to colon  . Colon cancer Father   . Dementia Mother   . Asthma Brother   . Diabetes Sister   . Colon polyps Neg Hx   . Esophageal cancer Neg Hx   . Rectal cancer Neg Hx     Past Surgical History:  Procedure Laterality Date  . COLONOSCOPY     2008 w/polyps,  09-22-2011 w/polyps.  01/2015 tubular adenoma x 1.  05/31/18 colonoscopy w/ two tubular adenomas.  . COLONOSCOPY W/ POLYPECTOMY  2008; 09/22/11;01/2015   Tubular adenoma--recall 3 yrs per Dr. Louanne Belton at [place].  Then switched to Dr. Hilarie Fredrickson with .  Marland Kitchen POLYPECTOMY    . UMBILICAL HERNIA REPAIR  2011   Social History   Occupational History  . Not on file  Tobacco Use  . Smoking status: Never Smoker  .  Smokeless tobacco: Never Used  Substance and Sexual Activity  . Alcohol use: Yes    Alcohol/week: 1.0 standard drinks    Types: 1 Cans of beer per week    Comment: occassional  . Drug use: No  . Sexual activity: Not on file

## 2019-10-30 NOTE — Progress Notes (Signed)
  Numeric Pain Rating Scale and Functional Assessment Average Pain (5)   In the last MONTH (on 0-10 scale) has pain interfered with the following?  1. General activity like being  able to carry out your everyday physical activities such as walking, climbing stairs, carrying groceries, or moving a chair?  Rating(5)   +Driver, -BT, -Dye Allergies.

## 2019-10-30 NOTE — Progress Notes (Signed)
   WILBER FINI - 79 y.o. male MRN [id number]  Date of birth: Dec 28, 1940  Office Visit Note: Visit Date: 10/30/2019 PCP: Tammi Sou, MD Referred by: Tammi Sou, MD  Subjective: Chief Complaint  Patient presents with  . Left Shoulder - Pain   HPI:  MAC DOWDELL is a 79 y.o. male who comes in today For planned left intra-articular glenohumeral joint injection with fluoroscopic guidance at the request of G. Alphonzo Severance, MD.  Patient reports left shoulder pain when he wakes up in the morning mainly.  He does not have much in way of shoulder pain right now and rates his pain as a 5 out of 10.  We did complete diagnostic and therapeutic injection he felt like he had some increased range of motion during the anesthetic phase but the real test will be when he sleeps on it for a few days.  He will follow up with Dr. Marlou Sa.  We actually saw the patient in 2018 I believe and completed a right glenohumeral joint injection that gave him a lot of relief.  ROS Otherwise per HPI.  Assessment & Plan: Visit Diagnoses:  1. Chronic left shoulder pain     Plan: No additional findings.   Meds & Orders: No orders of the defined types were placed in this encounter.   Orders Placed This Encounter  Procedures  . Large Joint Inj: L glenohumeral  . XR C-ARM NO REPORT    Follow-up: Return for visit to requesting physician as needed.   Procedures: Large Joint Inj: L glenohumeral on 10/30/2019 12:55 PM Indications: pain and diagnostic evaluation Details: 22 G 3.5 in needle, fluoroscopy-guided anteromedial approach  Arthrogram: No  Medications: 3 mL bupivacaine 0.5 %; 60 mg triamcinolone acetonide 40 MG/ML Outcome: tolerated well, no immediate complications  There was excellent flow of contrast producing a partial arthrogram of the glenohumeral joint. The patient did have relief of symptoms during the anesthetic phase of the injection. Procedure, treatment alternatives, risks and  benefits explained, specific risks discussed. Consent was given by the patient. Immediately prior to procedure a time out was called to verify the correct patient, procedure, equipment, support staff and site/side marked as required. Patient was prepped and draped in the usual sterile fashion.      No notes on file   Clinical History: No specialty comments available.     Objective:  VS:  HT:    WT:   BMI:     BP:   HR: bpm  TEMP: ( )  RESP:  Physical Exam  Ortho Exam Imaging: XR C-ARM NO REPORT  Result Date: 10/30/2019 Please see Notes tab for imaging impression.

## 2019-10-31 MED ORDER — TRIAMCINOLONE ACETONIDE 40 MG/ML IJ SUSP
60.0000 mg | INTRAMUSCULAR | Status: AC | PRN
  Administered 2019-10-30: 60 mg via INTRA_ARTICULAR

## 2019-10-31 MED ORDER — BUPIVACAINE HCL 0.5 % IJ SOLN
3.0000 mL | INTRAMUSCULAR | Status: AC | PRN
  Administered 2019-10-30: 3 mL via INTRA_ARTICULAR

## 2019-11-12 ENCOUNTER — Encounter: Payer: Self-pay | Admitting: Family Medicine

## 2019-12-22 ENCOUNTER — Other Ambulatory Visit: Payer: Self-pay | Admitting: Family Medicine

## 2020-01-13 ENCOUNTER — Other Ambulatory Visit: Payer: Self-pay

## 2020-01-13 ENCOUNTER — Encounter: Payer: Self-pay | Admitting: Podiatry

## 2020-01-13 ENCOUNTER — Ambulatory Visit (INDEPENDENT_AMBULATORY_CARE_PROVIDER_SITE_OTHER): Payer: Medicare Other | Admitting: Podiatry

## 2020-01-13 DIAGNOSIS — M2022 Hallux rigidus, left foot: Secondary | ICD-10-CM

## 2020-01-13 DIAGNOSIS — M79675 Pain in left toe(s): Secondary | ICD-10-CM | POA: Diagnosis not present

## 2020-01-13 DIAGNOSIS — B351 Tinea unguium: Secondary | ICD-10-CM | POA: Diagnosis not present

## 2020-01-13 DIAGNOSIS — M79674 Pain in right toe(s): Secondary | ICD-10-CM | POA: Diagnosis not present

## 2020-01-13 DIAGNOSIS — M2021 Hallux rigidus, right foot: Secondary | ICD-10-CM

## 2020-01-15 NOTE — Progress Notes (Signed)
Subjective:  Patient ID: Gregory Blevins, male    DOB: February 08, 1941,  MRN: [id number]  79 y.o. male presents with painful thick toenails that are difficult to trim. Pain interferes with ambulation. Aggravating factors include wearing enclosed shoe gear. Pain is relieved with periodic professional debridement.   He voices no new pedal problems on today's visit.   Last A1c was 6.1%.  Review of Systems: Negative except as noted in the HPI. Denies N/V/F/Ch. Past Medical History:  Diagnosis Date  . Chronic left shoulder pain    GH arthr->GH steroid injection 10/2019  . Diverticulosis 2013   noted on colonoscopy  . Fatty liver 05/2012   u/s done for mild/persistent elevation of LFTs  . Hay fever    no issues now-? out grown  . Hearing impairment    11/2015 Audiology eval= not yet ready for hearing aids ([facility])  . History of adenomatous polyp of colon    On multiple colonoscopies: most recent colonoscopy 05/31/18-->adenoma x 2, repeat 3-5 ys is optional.  . History of basal cell carcinoma of skin   . HTN (hypertension)   . Hyperlipidemia   . Lumbar spondylosis 04/2013   with grade I spondylolisthesis L4 on L5.  . Microhematuria 09/2017   On dipstick only.  Urine micro 09/2017 and 04/2018-->no RBCs.  . OAB (overactive bladder)    ditropan xl 10 ineffective--pt d/c'd this 09/2018  . Obesity   . 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 Prediabetes    A1c 6.1% 08/2019 at [facility]  . Rectal leakage    Past Surgical History:  Procedure Laterality Date  . COLONOSCOPY     2008 w/polyps,  09-22-2011 w/polyps.  01/2015 tubular adenoma x 1.  05/31/18 colonoscopy w/ two tubular adenomas.  . COLONOSCOPY W/ POLYPECTOMY  2008; 09/22/11;01/2015   Tubular adenoma--recall 3 yrs per Dr. Louanne Belton at [place].  Then switched to Dr. Hilarie Fredrickson with [place].  Marland Kitchen POLYPECTOMY    . UMBILICAL HERNIA REPAIR  2011    Patient Active Problem List   Diagnosis Date Noted  . Prostate cancer screening 10/21/2015  . Preventative health care 04/10/2014  . 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 Pelvic pain in male 03/21/2013  . Health maintenance examination 09/27/2012  . HTN (hypertension), benign 09/27/2012  . Basal cell carcinoma of skin 07/12/2011  . Colon polyp 07/12/2011  . High risk medication use 07/12/2011  . Elevation of level of transaminase or lactic acid dehydrogenase (LDH) 08/31/2010  . Onychomycosis due to dermatophyte 08/31/2010  . Osteoarthrosis involving lower leg 08/31/2010  . Other and unspecified hyperlipidemia 08/31/2010  . 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 Myalgia and myositis, unspecified 10/19/2009  . 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 Morbid obesity ([place]) 06/17/2009  . 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  atorvastatin (LIPITOR) 20 MG tablet, TAKE 1 TABLET DAILY, Disp: 90 tablet, Rfl: 0 .  celecoxib (CELEBREX) 200 MG capsule, Take 1 capsule (200 mg total) by mouth daily., Disp: 30 capsule, Rfl: 6 .  Cholecalciferol (VITAMIN D3 PO), Take 1 tablet by mouth daily., Disp: , Rfl:  .  lisinopril (ZESTRIL) 30 MG tablet, Take 1 tablet (30 mg total) by mouth daily., Disp: 90 tablet, Rfl: 1 No Known Allergies Social History   Tobacco Use  Smoking Status Never Smoker  Smokeless Tobacco Never Used  Objective:   Constitutional Pt is a pleasant 79 y.o. Caucasian male, obese in NAD.Marland Kitchen  Vascular Capillary refill time to digits <4 seconds b/l lower extremities. Faintly palpable pedal pulses b/l. Pedal hair sparse. Lower extremity skin temperature gradient warm to cool. No pain with calf compression b/l. No cyanosis or clubbing noted.  Neurologic Normal speech. Oriented to person, place, and time. Protective sensation intact 5/5 intact bilaterally with 10g monofilament b/l. Vibratory sensation intact b/l.  Clonus negative b/l.  Dermatologic Pedal skin is thin shiny, atrophic b/l lower extremities. No open wounds bilaterally. No interdigital macerations bilaterally. Toenails 1-5 b/l elongated, discolored, dystrophic, thickened, crumbly with subungual debris and tenderness to dorsal palpation.  Orthopedic: Normal muscle strength 5/5 to all lower extremity muscle groups bilaterally. No gross bony deformities bilaterally. Limited joint ROM to the 1st MPJ b/l. Patient ambulates independent of any assistive aids.   Radiographs: None Assessment:   1. Pain due to onychomycosis of toenails of both feet   2. Hallux rigidus of both feet    Plan:  Patient was evaluated and treated and all questions answered.  Onychomycosis with pain -Nails palliatively debridement as below. -Educated on self-care  Procedure: Nail Debridement Rationale: Pain Type of Debridement: manual, sharp debridement. Instrumentation: Nail nipper, rotary burr. Number of Nails: 10  -Examined patient. -No new findings. No new orders. -Toenails 1-5 b/l were debrided in length and girth with sterile nail nippers and dremel without iatrogenic bleeding.  -Patient to report any pedal injuries to medical professional immediately. -Patient to continue soft, supportive shoe gear daily. -Patient/POA to call should there be question/concern in the interim.  Return in about 3 months (around 04/14/2020).  Marzetta Board, DPM

## 2020-03-23 ENCOUNTER — Telehealth: Payer: Self-pay

## 2020-03-23 MED ORDER — ATORVASTATIN CALCIUM 20 MG PO TABS: 20.0000 mg | ORAL_TABLET | Freq: Every day | ORAL | 0 refills | Status: DC

## 2020-03-23 NOTE — Telephone Encounter (Signed)
Rx sent to [facility]

## 2020-03-23 NOTE — Telephone Encounter (Signed)
Patient refill request  atorvastatin (LIPITOR) 20 MG tablet [[id number]]    [facility]  **please do not send to mail order**

## 2020-04-06 DIAGNOSIS — L578 Other skin changes due to chronic exposure to nonionizing radiation: Secondary | ICD-10-CM | POA: Diagnosis not present

## 2020-04-06 DIAGNOSIS — L814 Other melanin hyperpigmentation: Secondary | ICD-10-CM | POA: Diagnosis not present

## 2020-04-06 DIAGNOSIS — L570 Actinic keratosis: Secondary | ICD-10-CM | POA: Diagnosis not present

## 2020-04-15 ENCOUNTER — Ambulatory Visit (INDEPENDENT_AMBULATORY_CARE_PROVIDER_SITE_OTHER): Payer: Medicare Other | Admitting: Family Medicine

## 2020-04-15 ENCOUNTER — Encounter: Payer: Self-pay | Admitting: Family Medicine

## 2020-04-15 ENCOUNTER — Other Ambulatory Visit: Payer: Self-pay

## 2020-04-15 VITALS — BP 107/65 | HR 65 | Temp 98.0°F | Resp 16 | Ht 67.0 in | Wt 226.8 lb

## 2020-04-15 DIAGNOSIS — K760 Fatty (change of) liver, not elsewhere classified: Secondary | ICD-10-CM | POA: Diagnosis not present

## 2020-04-15 DIAGNOSIS — E7800 Pure hypercholesterolemia, unspecified: Secondary | ICD-10-CM | POA: Diagnosis not present

## 2020-04-15 DIAGNOSIS — I10 Essential (primary) hypertension: Secondary | ICD-10-CM | POA: Diagnosis not present

## 2020-04-15 DIAGNOSIS — R609 Edema, unspecified: Secondary | ICD-10-CM

## 2020-04-15 DIAGNOSIS — Z23 Encounter for immunization: Secondary | ICD-10-CM

## 2020-04-15 DIAGNOSIS — R7303 Prediabetes: Secondary | ICD-10-CM | POA: Diagnosis not present

## 2020-04-15 LAB — COMPREHENSIVE METABOLIC PANEL
ALT: 30 U/L (ref 0–53)
AST: 23 U/L (ref 0–37)
Albumin: 4.3 g/dL (ref 3.5–5.2)
Alkaline Phosphatase: 87 U/L (ref 39–117)
BUN: 25 mg/dL — ABNORMAL HIGH (ref 6–23)
CO2: 28 mEq/L (ref 19–32)
Calcium: 9.5 mg/dL (ref 8.4–10.5)
Chloride: 103 mEq/L (ref 96–112)
Creatinine, Ser: 1.15 mg/dL (ref 0.40–1.50)
GFR: 60.19 mL/min (ref >60.00)
Glucose, Bld: 82 mg/dL (ref 70–99)
Potassium: 4.5 mEq/L (ref 3.5–5.1)
Sodium: 138 mEq/L (ref 135–145)
Total Bilirubin: 0.9 mg/dL (ref 0.2–1.2)
Total Protein: 7 g/dL (ref 6.0–8.3)

## 2020-04-15 LAB — LIPID PANEL
Cholesterol: 132 mg/dL (ref 0–200)
HDL: 40.7 mg/dL (ref >39.00)
LDL Cholesterol: 70 mg/dL (ref 0–99)
NonHDL: 91.21
Total CHOL/HDL Ratio: 3
Triglycerides: 108 mg/dL (ref 0.0–149.0)
VLDL: 21.6 mg/dL (ref 0.0–40.0)

## 2020-04-15 LAB — HEMOGLOBIN A1C: Hgb A1c MFr Bld: 5.8 % (ref 4.6–6.5)

## 2020-04-15 MED ORDER — TETANUS-DIPHTH-ACELL PERTUSSIS 5-2-15.5 LF-MCG/0.5 IM SUSP: 0.5000 mL | Freq: Once | INTRAMUSCULAR | 0 refills | Status: AC

## 2020-04-15 NOTE — Progress Notes (Signed)
OFFICE VISIT  04/15/2020  CC:  Chief Complaint  Patient presents with  . Follow-up    RCI, pt is fasting   HPI:    Patient is a 79 y.o. Caucasian male who presents for 6 mo f/u HTN, HLD, prediabetes. A/P as of last visit: "1) HTN: not ideal control systolic.  Increase lisinopril from 29m to 323mqd. Continue periodic home monitoring.  2) HLD: tolerating statin.  Lipid panel 6 mo ago at goal. Plan repeat 6 mo.  3) Fatty liver, obesity: needs to increase activity/exercise.  Continue with dietary changes.  4) [facility] Has made some mild dietary changes since.  Plan repeat A1c 6 mo.  5) Bilat shoulder osteoarthritis: L side giving him chronic trouble and he plans on contacting his ortho surgeon for further eval-->[place] vs discuss surgery.  6) Tinnitis and impaired hearing: stable. After covid crisis over he may need to return for repeat audiologic testing."  INTERIM HX: Feeling well. Eating better.  Has purposefully lost 8-9 lbs in 6 mo. [place] for activity, nothing else.  BPs: home monitoring: consistently <130/80. HLD: tolerating statin.  [place]--helped. Celebrex daily-->takes 200 mg once every day. Ringing in ears better since I last saw him.  Hard to tell what to attribute this to, though he feels like it may be the celebrex that has helped it.  Past Medical History:  Diagnosis Date  . Chronic left shoulder pain    Severe GH arthr->GH steroid injection 10/2019  . Diverticulosis 2013   noted on colonoscopy  . Fatty liver 05/2012   u/s done for mild/persistent elevation of LFTs  . Hay fever    no issues now-? out grown  . Hearing impairment    11/2015 Audiology eval= not yet ready for hearing aids ([facility])  . History of adenomatous polyp of colon    On multiple colonoscopies: most recent colonoscopy 05/31/18-->adenoma x 2, repeat 3-5 ys is optional.  . History  of basal cell carcinoma of skin   . HTN (hypertension)   . Hyperlipidemia   . Lumbar spondylosis 04/2013   with grade I spondylolisthesis L4 on L5.  . Microhematuria 09/2017   On dipstick only.  Urine micro 09/2017 and 04/2018-->no RBCs.  . OAB (overactive bladder)    ditropan xl 10 ineffective--pt d/c'd this 09/2018  . Obesity   . 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 Prediabetes    [facility]. Rectal leakage     Past Surgical History:  Procedure Laterality Date  . COLONOSCOPY     2008 w/polyps,  09-22-2011 w/polyps.  01/2015 tubular adenoma x 1.  05/31/18 colonoscopy w/ two tubular adenomas.  . COLONOSCOPY W/ POLYPECTOMY  2008; 09/22/11;01/2015   Tubular adenoma--recall 3 yrs per Dr. NoLouanne [place] Then switched to Dr. PyHilarie Fredricksonith Shafer.  . Marland KitchenOLYPECTOMY    . UMBILICAL HERNIA REPAIR  2011    Outpatient Medications Prior to Visit  Medication Sig Dispense Refill  . atorvastatin (LIPITOR) 20 MG tablet Take 1 tablet (20 mg total) by mouth daily. 90 tablet 0  . celecoxib (CELEBREX) 200 MG capsule Take 1 capsule (200 mg total) by mouth daily. 30 capsule 6  . Cholecalciferol (VITAMIN D3 PO) Take 1 tablet by mouth daily.    . Marland Kitchenisinopril (ZESTRIL) 30 MG tablet  Take 1 tablet (30 mg total) by mouth daily. 90 tablet 1   No facility-administered medications prior to visit.    No Known Allergies  ROS As per HPI  PE: Vitals with BMI 04/15/2020 10/17/2019 10/17/2019  Height 5' 7"  - 5' 7"   Weight 226 lbs 13 oz - 235 lbs 3 oz  BMI 16.10 - 96.04  Systolic 540 981 191  Diastolic 65 74 77  Pulse 65 - 67    Gen: Alert, well appearing.  Patient is oriented to person, place, time, and situation. AFFECT: pleasant, lucid thought and speech. CV: RRR, no m/r/g.   LUNGS: CTA bilat, nonlabored resps, good aeration in all lung fields. EXT: no clubbing or cyanosis.  3+ RLL pitting edema,  2+ L LL pitting edema.    LABS:  Lab Results  Component Value Date   TSH 1.23 08/13/2019   Lab Results  Component Value Date   WBC 10.8 (H) 07/09/2017   HGB 15.4 07/09/2017   HCT 46.0 07/09/2017   MCV 90.2 07/09/2017   PLT 157 07/09/2017   Lab Results  Component Value Date   CREATININE 1.13 10/16/2019   BUN 30 (H) 10/16/2019   NA 140 10/16/2019   K 4.8 10/16/2019   CL 107 10/16/2019   CO2 26 10/16/2019   Lab Results  Component Value Date   ALT 31 10/16/2019   AST 21 10/16/2019   ALKPHOS 72 10/16/2019   BILITOT 0.6 10/16/2019   Lab Results  Component Value Date   CHOL 139 04/23/2019   Lab Results  Component Value Date   HDL 42.20 04/23/2019   Lab Results  Component Value Date   LDLCALC 65 04/23/2019   Lab Results  Component Value Date   TRIG 160.0 (H) 04/23/2019   Lab Results  Component Value Date   CHOLHDL 3 04/23/2019   Lab Results  Component Value Date   PSA 0.90 09/18/2017   PSA 0.75 10/14/2014   PSA 0.84 09/29/2013   Lab Results  Component Value Date   HGBA1C 6.1 08/13/2019   IMPRESSION AND PLAN:  1) HTN: stable. Continue lisinopril. BMET today.  2) HLD: tolerating statin. LDL 65 one year ago. FLP and hepatic panel today.  3) Prediabetes: improving diet and has lost some wt. Fasting glucose and Hba1c today.  4) LE edema: chronic, reminded him to limit Na intake.  5) Osteoarthritis L shoulder: responded well to ortho doing [place] steroid injection 10/2019. Takes celebrex qAM b/c sleeping on it at night causes pain in mornings. Good in day and evening after that, though. Monitor renal function.  6) Preventative health: Covid UTD.   Tdap due->rx sent to pharmacy for this today. FLu vaccine->given today.  An After Visit Summary was printed and given to the patient.  FOLLOW UP: Return in about 6 months (around 10/14/2020) for routine chronic illness f/u.  Signed:  Crissie Sickles, MD           04/15/2020

## 2020-04-16 ENCOUNTER — Other Ambulatory Visit: Payer: Self-pay | Admitting: Family Medicine

## 2020-04-20 ENCOUNTER — Ambulatory Visit (INDEPENDENT_AMBULATORY_CARE_PROVIDER_SITE_OTHER): Payer: Medicare Other | Admitting: Podiatry

## 2020-04-20 ENCOUNTER — Encounter: Payer: Self-pay | Admitting: Podiatry

## 2020-04-20 ENCOUNTER — Other Ambulatory Visit: Payer: Self-pay

## 2020-04-20 DIAGNOSIS — I739 Peripheral vascular disease, unspecified: Secondary | ICD-10-CM | POA: Diagnosis not present

## 2020-04-20 DIAGNOSIS — L84 Corns and callosities: Secondary | ICD-10-CM | POA: Diagnosis not present

## 2020-04-20 DIAGNOSIS — M79674 Pain in right toe(s): Secondary | ICD-10-CM

## 2020-04-20 DIAGNOSIS — M79675 Pain in left toe(s): Secondary | ICD-10-CM | POA: Diagnosis not present

## 2020-04-20 DIAGNOSIS — B351 Tinea unguium: Secondary | ICD-10-CM | POA: Diagnosis not present

## 2020-04-24 NOTE — Progress Notes (Signed)
Subjective:  Patient ID: Gregory Blevins, male    DOB: [birth year],  MRN: [id number]  79 y.o. male presents with painful thick toenails that are difficult to trim. Pain interferes with ambulation. Aggravating factors include wearing enclosed shoe gear. Pain is relieved with periodic professional debridement.   He states he stubbed his left 5th toe a while ago.    He voices no other pedal problems on today's visit.  Review of Systems: Negative except as noted in the HPI. Denies N/V/F/Ch. Past Medical History:  Diagnosis Date  . Chronic left shoulder pain    Severe GH arthr->GH steroid injection 10/2019  . Diverticulosis 2013   noted on colonoscopy  . Fatty liver 05/2012   u/s done for mild/persistent elevation of LFTs  . Hay fever    no issues now-? out grown  . Hearing impairment    11/2015 Audiology eval= not yet ready for hearing aids ([facility])  . History of adenomatous polyp of colon    On multiple colonoscopies: most recent colonoscopy 05/31/18-->adenoma x 2, repeat 3-5 ys is optional.  . History of basal cell carcinoma of skin   . HTN (hypertension)   . Hyperlipidemia   . Lumbar spondylosis 04/2013   with grade I spondylolisthesis L4 on L5.  . Microhematuria 09/2017   On dipstick only.  Urine micro 09/2017 and 04/2018-->no RBCs.  . OAB (overactive bladder)    ditropan xl 10 ineffective--pt d/c'd this 09/2018  . Obesity   . 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 Prediabetes    A1c 6.1% 08/2019 at [facility]  . Rectal leakage    Past Surgical History:  Procedure Laterality Date  . COLONOSCOPY     2008 w/polyps,  09-22-2011 w/polyps.  01/2015 tubular adenoma x 1.  05/31/18 colonoscopy w/ two tubular adenomas.  . COLONOSCOPY W/ POLYPECTOMY  2008; 09/22/11;01/2015   Tubular adenoma--recall 3 yrs per Dr. Louanne Belton at [place].  Then switched to Dr. Hilarie Fredrickson with [place].  Marland Kitchen  POLYPECTOMY    . UMBILICAL HERNIA REPAIR  2011   Patient Active Problem List   Diagnosis Date Noted  . Prostate cancer screening 10/21/2015  . Preventative health care 04/10/2014  . 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 Pelvic pain in male 03/21/2013  . Health maintenance examination 09/27/2012  . HTN (hypertension), benign 09/27/2012  . Basal cell carcinoma of skin 07/12/2011  . Colon polyp 07/12/2011  . High risk medication use 07/12/2011  . Elevation of level of transaminase or lactic acid dehydrogenase (LDH) 08/31/2010  . Onychomycosis due to dermatophyte 08/31/2010  . Osteoarthrosis involving lower leg 08/31/2010  . Other and unspecified hyperlipidemia 08/31/2010  . 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 Myalgia and myositis, unspecified 10/19/2009  . 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 Morbid obesity ([place]) 06/17/2009  . 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  atorvastatin (LIPITOR) 20 MG tablet, Take 1 tablet (20 mg total) by mouth daily., Disp: 90 tablet, Rfl: 0 .  celecoxib (CELEBREX) 200 MG capsule, TAKE 1 CAPSULE BY MOUTH EVERY DAY, Disp: 30 capsule, Rfl: 6 .  Cholecalciferol (VITAMIN D3 PO), Take 1 tablet by mouth daily., Disp: , Rfl:  .  lisinopril (ZESTRIL) 30 MG tablet, Take 1 tablet (30 mg total) by mouth daily., Disp: 90 tablet, Rfl: 1 No Known Allergies Social History   Tobacco Use  Smoking Status Never Smoker  Smokeless Tobacco Never Used    Objective:   Constitutional Pt is a pleasant 79 y.o. Caucasian male, obese in NAD.  Vascular Capillary refill time to digits <4 seconds b/l lower extremities. Faintly palpable DP pulse(s) b/l lower extremities. Nonpalpable PT pulse(s) b/l lower extremities. Pedal hair absent. Lower extremity skin temperature gradient warm to cool. No pain with calf compression b/l. No cyanosis or clubbing noted.  Neurologic Normal speech. Oriented to person, place,  and time. Protective sensation intact 5/5 intact bilaterally with 10g monofilament b/l. Vibratory sensation intact b/l. Clonus negative b/l.  Dermatologic Pedal skin is thin shiny, atrophic b/l lower extremities. No open wounds bilaterally. No interdigital macerations bilaterally. Toenails 1-5 b/l elongated, discolored, dystrophic, thickened, crumbly with subungual debris and tenderness to dorsal palpation. Hyperkeratotic lesion(s) R 2nd toeDIPJ.  No erythema, no edema, no drainage, no fluctuance. Old subungual hematoma left 5th toe. Nailplate remains adhered. No edema, no erythema, no drainage.  Orthopedic: Normal muscle strength 5/5 to all lower extremity muscle groups bilaterally. No gross bony deformities bilaterally. Limited joint ROM to the 1st MPJ b/l. Patient ambulates independent of any assistive aids.   Radiographs: None Assessment:   1. Pain due to onychomycosis of toenails of both feet   2. Corns   3. PAD (peripheral artery disease) ([place])    Plan:  Patient was evaluated and treated and all questions answered.  Onychomycosis with pain -Nails palliatively debridement as below. -Educated on self-care  Procedure: Nail Debridement Rationale: Pain Type of Debridement: manual, sharp debridement. Instrumentation: Nail nipper, rotary burr. Number of Nails: 10  -Examined patient. -No new findings. No new orders. -Toenails 1-5 b/l were debrided in length and girth with sterile nail nippers and dremel without iatrogenic bleeding.  -Corn(s) R 2nd toe pared utilizing sterile scalpel blade without complication or incident. Total number debrided=1. -Patient to report any pedal injuries to medical professional immediately. -Patient to continue soft, supportive shoe gear daily. -Patient/POA to call should there be question/concern in the interim.  Return in about 3 months (around 07/21/2020).  Marzetta Board, DPM

## 2020-06-05 ENCOUNTER — Other Ambulatory Visit: Payer: Self-pay | Admitting: Family Medicine

## 2020-06-08 NOTE — Progress Notes (Signed)
Subjective:   Gregory Blevins is a 79 y.o. male who presents for Medicare Annual/Subsequent preventive examination.   Review of Systems     Cardiac Risk Factors include: advanced age (>29mn, >>69women);male gender;hypertension;obesity (BMI >30kg/m2);sedentary lifestyle;dyslipidemia     Objective:    Today's Vitals   06/09/20 0935 06/09/20 0938  BP: (!) 148/80   Pulse: 61   Resp: 16   Temp: 98.1 F (36.7 C)   TempSrc: Oral   SpO2: 99%   Weight: 233 lb (105.7 kg)   Height: 5' 7"  (1.702 m)   PainSc:  1    Body mass index is 36.49 kg/m.  Advanced Directives 06/09/2020 04/11/2019 04/09/2018 07/09/2017 01/22/2015  Does Patient Have a Medical Advance Directive? Yes Yes Yes No Yes  Type of AParamed[place]of [place] will [place] will Living will;Healthcare Power of [place] will  Copy of [place] Chart? No - copy requested Yes - validated most recent copy scanned in chart (See row information) No - copy requested - -    Current Medications (verified) Outpatient Encounter Medications as of 06/09/2020  Medication Sig  . atorvastatin (LIPITOR) 20 MG tablet TAKE 1 TABLET BY MOUTH EVERY DAY  . celecoxib (CELEBREX) 200 MG capsule TAKE 1 CAPSULE BY MOUTH EVERY DAY  . Cholecalciferol (VITAMIN D3 PO) Take 1 tablet by mouth daily.  .Marland Kitchenlisinopril (ZESTRIL) 30 MG tablet TAKE 1 TABLET BY MOUTH EVERY DAY  . losartan (COZAAR) 25 MG tablet Take by mouth.   No facility-administered encounter medications on file as of 06/09/2020.    Allergies (verified) Patient has no known allergies.   History: Past Medical History:  Diagnosis Date  . Chronic left shoulder pain    Severe GH arthr->GH steroid injection 10/2019  . Diverticulosis 2013   noted on colonoscopy  . Fatty liver 05/2012   u/s done for mild/persistent elevation of LFTs  . Hay fever    no issues now-? out grown  .  Hearing impairment    11/2015 Audiology eval= not yet ready for hearing aids ([facility])  . History of adenomatous polyp of colon    On multiple colonoscopies: most recent colonoscopy 05/31/18-->adenoma x 2, repeat 3-5 ys is optional.  . History of basal cell carcinoma of skin   . HTN (hypertension)   . Hyperlipidemia   . Lumbar spondylosis 04/2013   with grade I spondylolisthesis L4 on L5.  . Microhematuria 09/2017   On dipstick only.  Urine micro 09/2017 and 04/2018-->no RBCs.  . OAB (overactive bladder)    ditropan xl 10 ineffective--pt d/c'd this 09/2018  . Obesity   . 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 Prediabetes    [facility] Rectal leakage    Past Surgical History:  Procedure Laterality Date  . COLONOSCOPY     2008 w/polyps,  09-22-2011 w/polyps.  01/2015 tubular adenoma x 1.  05/31/18 colonoscopy w/ two tubular adenomas.  . COLONOSCOPY W/ POLYPECTOMY  2008; 09/22/11;01/2015   Tubular aden[place]  Then [place] to Dr[place]ilarie Fredricksonwith [place].  .Marland KitchenPOLYPECTOMY    . UMBILICAL HERNIA REPAIR  2011   Family History  Problem Relation Age of Onset  . Hypertension Father   . Alcoholism Father   . Stomach cancer  Father        primary with mets to colon  . Colon cancer Father   . Dementia Mother   . Asthma Brother   . Diabetes Sister   . Colon polyps Neg Hx   . Esophageal cancer Neg Hx   . Rectal cancer Neg Hx    Social History   Socioeconomic History  . Marital status: Married    Spouse name: Not on file  . Number of children: Not on file  . Years of education: Not on file  . Highest education level: Not on file  Occupational History  . Not on file  Tobacco Use  . Smoking status: Never Smoker  . Smokeless tobacco: Never Used  Vaping Use  . Vaping Use: Never used  Substance and Sexual Activity  . Alcohol use:  Yes    Alcohol/week: 1.0 standard drink    Types: 1 Cans of beer per week    Comment: occassional  . Drug use: No  . Sexual activity: Not on file  Other Topics Concern  . Not on file  Social History Narrative   Married, 2 children, 3 grandchildren.   Occupation: retired Financial trader   Education: HS   No tob, minimal alcohol, no drugs.    Social Determinants of Health   Financial Resource Strain: Low Risk   . Difficulty of Paying Living Expenses: Not hard at all  Food Insecurity: No Food Insecurity  . Worried About Charity fundraiser in the Last Year: Never true  . Ran Out of Food in the Last Year: Never true  Transportation Needs: No Transportation Needs  . Lack of Transportation (Medical): No  . Lack of Transportation (Non-Medical): No  Physical Activity: Inactive  . Days of Exercise per Week: 0 days  . Minutes of Exercise per Session: 0 min  Stress: No Stress Concern Present  . Feeling of Stress : Not at all  Social Connections: Socially Integrated  . Frequency of Communication with Friends and Family: More than three times a week  . Frequency of Social Gatherings with Friends and Family: More than three times a week  . Attends Religious Services: More than 4 times per year  . Active Member of Clubs or Organizations: Yes  . Attends Archivist Meetings: More than 4 times per year  . Marital Status: Married    Tobacco Counseling Counseling given: Not Answered   Clinical Intake:  Pre-visit preparation completed: Yes  Pain : 0-10 Pain Score: 1  Pain Type: Chronic pain Pain Location: Arm Pain Orientation: Left Pain Onset: More than a month ago Pain Frequency: Intermittent     Nutritional Status: BMI > 30  Obese Nutritional Risks: None Diabetes: No  How often do you need to have someone help you when you read instructions, pamphlets, or other written materials from your doctor or pharmacy?: 1 - Never What is the last grade level  you completed in school?: 12th grade  Diabetic?No  Interpreter Needed?: No  Comments: Caroleen Hamman LPN   Activities of Daily Living In your present state of health, do you have any difficulty performing the following activities: 06/09/2020  Hearing? N  Vision? N  Difficulty concentrating or making decisions? N  Walking or climbing stairs? N  Dressing or bathing? N  Doing errands, shopping? N  Preparing Food and eating ? N  Using the Toilet? N  In the past six months, have you accidently leaked urine? N  Do you have problems with loss  of bowel control? N  Managing your Medications? N  Managing your Finances? N  Housekeeping or managing your Housekeeping? N  Some recent data might be hidden    Patient Care Team: Tammi Sou, MD as PCP - General (Family Medicine) Pyrtle, Lajuan Lines, MD as Consulting Physician (Gastroenterology) [facility] Marlou Sa, Tonna Corner, MD as Consulting Physician (Orthopedic Surgery) Paulla Dolly Tamala Fothergill, DPM as Consulting Physician (Podiatry)  Indicate any recent Medical Services you may have received from other than [facility] providers in the past year (date may be approximate).     Assessment:   This is a routine wellness examination for Gregory Blevins.  Hearing/Vision screen  Hearing Screening   125Hz  250Hz  500Hz  1000Hz  2000Hz  3000Hz  4000Hz  6000Hz  8000Hz   Right ear:           Left ear:           Comments: No  issues  Vision Screening Comments: [place] glasses Last eye exam-06/2020-Dr. Mendel Ryder  Dietary issues and exercise activities discussed: Current Exercise Habits: The patient does not participate in regular exercise at present, Exercise limited by: None identified  Goals    . Patient Stated     Would like to lose 10 pounds      Depression Screen PHQ 2/9 Scores 06/09/2020 04/15/2020 04/11/2019 04/09/2018 09/18/2017 03/22/2016 10/13/2015  PHQ - 2 Score 0 0 0 0 0 1 0    Fall Risk Fall Risk  06/09/2020 04/15/2020 04/11/2019 04/09/2018 09/18/2017    Falls in the past year? 1 0 0 No No  Comment - - - - -  Number falls in past yr: 0 0 0 - -  Injury with Fall? 0 0 0 - -  Risk for fall due to : History of fall(s) - - - -  Follow up Falls prevention discussed Falls evaluation completed Falls prevention discussed - -    Any stairs in or around the home? Yes  If so, are there any without handrails? No  Home free of loose throw rugs in walkways, pet beds, electrical cords, etc? Yes  Adequate lighting in your home to reduce risk of falls? Yes   ASSISTIVE DEVICES UTILIZED TO PREVENT FALLS:  Life alert? No  Use of a cane, walker or w/c? No  Grab bars in the bathroom? Yes  Shower chair or bench in shower? No  Elevated toilet seat or a handicapped toilet? No   TIMED UP AND GO:  Was the test performed? Yes .  Length of time to ambulate 10 feet: 10 sec.   Gait steady and fast without use of assistive device  Cognitive Function:No cognitive impairment noted MMSE - Mini Mental State Exam 04/11/2019 04/09/2018  Orientation to time 5 5  Orientation to Place 5 5  Registration 3 3  Attention/ Calculation 5 5  Recall 2 1  Language- name 2 objects 2 2  Language- repeat 1 1  Language- follow 3 step command 3 3  Language- read & follow direction 1 1  Write a sentence 1 1  Copy design 1 1  Total score 29 28     6CIT Screen 06/09/2020  What Year? 0 points  What month? 0 points  What time? 0 points  Count back from 20 0 points  Months in reverse 0 points  Repeat phrase 2 points  Total Score 2    Immunizations Immunization History  Administered Date(s) Administered  . Fluad Quad(high Dose 65+) 04/11/2019, 04/15/2020  . Influenza Split 07/14/2011, 05/13/2012  . Influenza, High  Dose Seasonal PF 04/16/2015, 03/22/2016, 03/21/2017, 04/09/2018  . Influenza,inj,Quad PF,6+ Mos 03/21/2013, 04/10/2014  . Moderna SARS-COVID-2 Vaccination 08/13/2019, 09/09/2019  . Pneumococcal Conjugate-13 04/10/2014  . Pneumococcal Polysaccharide-23  07/19/2009  . Td 06/17/2009  . Zoster 07/05/2010    TDAP status: Due, Education has been provided regarding the importance of this vaccine. Advised may receive this vaccine at local pharmacy or Health Dept. Aware to provide a copy of the vaccination record if obtained from local pharmacy or Health Dept. Verbalized acceptance and understanding.   Flu Vaccine status: Up to date   Pneumococcal vaccine status: Up to date   Covid-19 vaccine status: Completed vaccines  Qualifies for Shingles Vaccine? Yes   Zostavax completed Yes   Shingrix Completed?: No.    Education has been provided regarding the importance of this vaccine. Patient has been advised to call insurance company to determine out of pocket expense if they have not yet received this vaccine. Advised may also receive vaccine at local pharmacy or Health Dept. Verbalized acceptance and understanding.  Screening Tests Health Maintenance  Topic Date Due  . TETANUS/TDAP  06/18/2019  . INFLUENZA VACCINE  Completed  . COVID-19 Vaccine  Completed  . Hepatitis C Screening  Completed  . PNA vac Low Risk Adult  Completed    Health Maintenance  Health Maintenance Due  Topic Date Due  . TETANUS/TDAP  06/18/2019    Colorectal cancer screening: No longer required.   Lung Cancer Screening: (Low Dose CT Chest recommended if Age 44-80 years, 30 pack-year currently smoking OR have quit w/in 15years.) does not qualify.     Additional Screening:  Hepatitis C Screening:Completed 10/13/2015  Vision Screening: Recommended annual ophthalmology exams for early detection of glaucoma and other disorders of the eye. Is the patient up to date with their annual eye exam?  Yes  Who is the provider or what is the name of the office in which the patient attends annual eye exams? Dr. Mendel Ryder   Dental Screening: Recommended annual dental exams for proper oral hygiene  Community Resource Referral / Chronic Care Management: CRR required this visit?   No   CCM required this visit?  No      Plan:     I have personally reviewed and noted the following in the patient's chart:   . Medical and social history . Use of alcohol, tobacco or illicit drugs  . Current medications and supplements . Functional ability and status . Nutritional status . Physical activity . Advanced directives . List of other physicians . Hospitalizations, surgeries, and ER visits in previous 12 months . Vitals . Screenings to include cognitive, depression, and falls . Referrals and appointments  In addition, I have reviewed and discussed with patient certain preventive protocols, quality metrics, and best practice recommendations. A written personalized care plan for preventive services as well as general preventive health recommendations were provided to patient.   Patient to access avs via mychart.   Marta Antu, LPN   31/10/9700  Nurse Health Advisor  Nurse Notes: None

## 2020-06-09 ENCOUNTER — Other Ambulatory Visit: Payer: Self-pay

## 2020-06-09 ENCOUNTER — Ambulatory Visit (INDEPENDENT_AMBULATORY_CARE_PROVIDER_SITE_OTHER): Payer: Medicare Other

## 2020-06-09 VITALS — BP 148/80 | HR 61 | Temp 98.1°F | Resp 16 | Ht 67.0 in | Wt 233.0 lb

## 2020-06-09 DIAGNOSIS — Z0000 Encounter for general adult medical examination without abnormal findings: Secondary | ICD-10-CM

## 2020-06-09 NOTE — Patient Instructions (Signed)
Gregory Blevins , Thank you for taking time to come for your Medicare Wellness Visit. I appreciate your ongoing commitment to your health goals. Please review the following plan we discussed and let me know if I can assist you in the future.   Screening recommendations/referrals: Colonoscopy: No longer required Recommended yearly ophthalmology/optometry visit for glaucoma screening and checkup Recommended yearly dental visit for hygiene and checkup  Vaccinations: Influenza vaccine: Up to date Pneumococcal vaccine: Completed vaccines Tdap vaccine: Discuss with pharmacy Shingles vaccine: Discuss with pharmacy   Covid-19: Completed vaccines  Advanced directives: Please bring a copy for your chart  Conditions/risks identified: See problem list  Next appointment: Follow up in one year for your annual wellness visit. 06/15/2021 @ 9:45  Preventive Care 65 Years and Older, Male Preventive care refers to lifestyle choices and visits with your health care provider that can promote health and wellness. What does preventive care include?  A yearly physical exam. This is also called an annual well check.  Dental exams once or twice a year.  Routine eye exams. Ask your health care provider how often you should have your eyes checked.  Personal lifestyle choices, including:  Daily care of your teeth and gums.  Regular physical activity.  Eating a healthy diet.  Avoiding tobacco and drug use.  Limiting alcohol use.  Practicing safe sex.  Taking low doses of aspirin every day.  Taking vitamin and mineral supplements as recommended by your health care provider. What happens during an annual well check? The services and screenings done by your health care provider during your annual well check will depend on your age, overall health, lifestyle risk factors, and family history of disease. Counseling  Your health care provider may ask you questions about your:  Alcohol use.  Tobacco  use.  Drug use.  Emotional well-being.  Home and relationship well-being.  Sexual activity.  Eating habits.  History of falls.  Memory and ability to understand (cognition).  Work and work Statistician. Screening  You may have the following tests or measurements:  Height, weight, and BMI.  Blood pressure.  Lipid and cholesterol levels. These may be checked every 5 years, or more frequently if you are over 62 years old.  Skin check.  Lung cancer screening. You may have this screening every year starting at age 44 if you have a 30-pack-year history of smoking and currently smoke or have quit within the past 15 years.  Fecal occult blood test (FOBT) of the stool. You may have this test every year starting at age 94.  Flexible sigmoidoscopy or colonoscopy. You may have a sigmoidoscopy every 5 years or a colonoscopy every 10 years starting at age 86.  Prostate cancer screening. Recommendations will vary depending on your family history and other risks.  Hepatitis C blood test.  Hepatitis B blood test.  Sexually transmitted disease (STD) testing.  Diabetes screening. This is done by checking your blood sugar (glucose) after you have not eaten for a while (fasting). You may have this done every 1-3 years.  Abdominal aortic aneurysm (AAA) screening. You may need this if you are a current or former smoker.  Osteoporosis. You may be screened starting at age 41 if you are at high risk. Talk with your health care provider about your test results, treatment options, and if necessary, the need for more tests. Vaccines  Your health care provider may recommend certain vaccines, such as:  Influenza vaccine. This is recommended every year.  Tetanus, diphtheria, and  acellular pertussis (Tdap, Td) vaccine. You may need a Td booster every 10 years.  Zoster vaccine. You may need this after age 46.  Pneumococcal 13-valent conjugate (PCV13) vaccine. One dose is recommended after age  6.  Pneumococcal polysaccharide (PPSV23) vaccine. One dose is recommended after age 71. Talk to your health care provider about which screenings and vaccines you need and how often you need them. This information is not intended to replace advice given to you by your health care provider. Make sure you discuss any questions you have with your health care provider. Document Released: 07/23/2015 Document Revised: 03/15/2016 Document Reviewed: 04/27/2015 Elsevier Interactive Patient Education  2017 [place] Prevention in the Home Falls can cause injuries. They can happen to people of all ages. There are many things you can do to make your home safe and to help prevent falls. What can I do on the outside of my home?  Regularly fix the edges of walkways and driveways and fix any cracks.  Remove anything that might make you trip as you walk through a door, such as a raised step or threshold.  Trim any bushes or trees on the path to your home.  Use bright outdoor lighting.  Clear any walking paths of anything that might make someone trip, such as rocks or tools.  Regularly check to see if handrails are loose or broken. Make sure that both sides of any steps have handrails.  Any raised decks and porches should have guardrails on the edges.  Have any leaves, snow, or ice cleared regularly.  Use sand or salt on walking paths during winter.  Clean up any spills in your garage right away. This includes oil or grease spills. What can I do in the bathroom?  Use night lights.  Install grab bars by the toilet and in the tub and shower. Do not use towel bars as grab bars.  Use non-skid mats or decals in the tub or shower.  If you need to sit down in the shower, use a plastic, non-slip stool.  Keep the floor dry. Clean up any water that spills on the floor as soon as it happens.  Remove soap buildup in the tub or shower regularly.  Attach bath mats securely with double-sided  non-slip rug tape.  Do not have throw rugs and other things on the floor that can make you trip. What can I do in the bedroom?  Use night lights.  Make sure that you have a light by your bed that is easy to reach.  Do not use any sheets or blankets that are too big for your bed. They should not hang down onto the floor.  Have a firm chair that has side arms. You can use this for support while you get dressed.  Do not have throw rugs and other things on the floor that can make you trip. What can I do in the kitchen?  Clean up any spills right away.  Avoid walking on wet floors.  Keep items that you use a lot in easy-to-reach places.  If you need to reach something above you, use a strong step stool that has a grab bar.  Keep electrical cords out of the way.  Do not use floor polish or wax that makes floors slippery. If you must use wax, use non-skid floor wax.  Do not have throw rugs and other things on the floor that can make you trip. What can I do with my stairs?  Do not leave any items on the stairs.  Make sure that there are handrails on both sides of the stairs and use them. Fix handrails that are broken or loose. Make sure that handrails are as long as the stairways.  Check any carpeting to make sure that it is firmly attached to the stairs. Fix any carpet that is loose or worn.  Avoid having throw rugs at the top or bottom of the stairs. If you do have throw rugs, attach them to the floor with carpet tape.  Make sure that you have a light switch at the top of the stairs and the bottom of the stairs. If you do not have them, ask someone to add them for you. What else can I do to help prevent falls?  Wear shoes that:  Do not have high heels.  Have rubber bottoms.  Are comfortable and fit you well.  Are closed at the toe. Do not wear sandals.  If you use a stepladder:  Make sure that it is fully opened. Do not climb a closed stepladder.  Make sure that both  sides of the stepladder are locked into place.  Ask someone to hold it for you, if possible.  Clearly mark and make sure that you can see:  Any grab bars or handrails.  First and last steps.  Where the edge of each step is.  Use tools that help you move around (mobility aids) if they are needed. These include:  Canes.  Walkers.  Scooters.  Crutches.  Turn on the lights when you go into a dark area. Replace any light bulbs as soon as they burn out.  Set up your furniture so you have a clear path. Avoid moving your furniture around.  If any of your floors are uneven, fix them.  If there are any pets around you, be aware of where they are.  Review your medicines with your doctor. Some medicines can make you feel dizzy. This can increase your chance of falling. Ask your doctor what other things that you can do to help prevent falls. This information is not intended to replace advice given to you by your health care provider. Make sure you discuss any questions you have with your health care provider. Document Released: 04/22/2009 Document Revised: 12/02/2015 Document Reviewed: 07/31/2014 Elsevier Interactive Patient Education  2017 [employer].

## 2020-06-10 DIAGNOSIS — Z135 Encounter for screening for eye and ear disorders: Secondary | ICD-10-CM | POA: Diagnosis not present

## 2020-06-10 DIAGNOSIS — H35033 Hypertensive retinopathy, bilateral: Secondary | ICD-10-CM | POA: Diagnosis not present

## 2020-06-10 DIAGNOSIS — H524 Presbyopia: Secondary | ICD-10-CM | POA: Diagnosis not present

## 2020-06-10 DIAGNOSIS — H2513 Age-related nuclear cataract, bilateral: Secondary | ICD-10-CM | POA: Diagnosis not present

## 2020-07-27 ENCOUNTER — Ambulatory Visit: Payer: Medicare Other | Admitting: Podiatry

## 2020-08-13 DIAGNOSIS — Z85828 Personal history of other malignant neoplasm of skin: Secondary | ICD-10-CM | POA: Diagnosis not present

## 2020-08-13 DIAGNOSIS — L570 Actinic keratosis: Secondary | ICD-10-CM | POA: Diagnosis not present

## 2020-08-13 DIAGNOSIS — D1801 Hemangioma of skin and subcutaneous tissue: Secondary | ICD-10-CM | POA: Diagnosis not present

## 2020-08-13 DIAGNOSIS — L905 Scar conditions and fibrosis of skin: Secondary | ICD-10-CM | POA: Diagnosis not present

## 2020-08-13 DIAGNOSIS — L821 Other seborrheic keratosis: Secondary | ICD-10-CM | POA: Diagnosis not present

## 2020-10-14 ENCOUNTER — Ambulatory Visit (INDEPENDENT_AMBULATORY_CARE_PROVIDER_SITE_OTHER): Payer: Medicare Other | Admitting: Family Medicine

## 2020-10-14 ENCOUNTER — Telehealth: Payer: Self-pay | Admitting: Family Medicine

## 2020-10-14 ENCOUNTER — Other Ambulatory Visit: Payer: Self-pay

## 2020-10-14 ENCOUNTER — Encounter: Payer: Self-pay | Admitting: Family Medicine

## 2020-10-14 VITALS — BP 100/58 | HR 63 | Temp 97.8°F | Resp 16 | Ht 67.0 in | Wt 232.0 lb

## 2020-10-14 DIAGNOSIS — I872 Venous insufficiency (chronic) (peripheral): Secondary | ICD-10-CM

## 2020-10-14 DIAGNOSIS — M79661 Pain in right lower leg: Secondary | ICD-10-CM

## 2020-10-14 DIAGNOSIS — I10 Essential (primary) hypertension: Secondary | ICD-10-CM

## 2020-10-14 DIAGNOSIS — M7989 Other specified soft tissue disorders: Secondary | ICD-10-CM | POA: Diagnosis not present

## 2020-10-14 DIAGNOSIS — R7303 Prediabetes: Secondary | ICD-10-CM | POA: Diagnosis not present

## 2020-10-14 DIAGNOSIS — E7800 Pure hypercholesterolemia, unspecified: Secondary | ICD-10-CM

## 2020-10-14 DIAGNOSIS — E875 Hyperkalemia: Secondary | ICD-10-CM

## 2020-10-14 LAB — COMPREHENSIVE METABOLIC PANEL
ALT: 37 U/L (ref 0–53)
AST: 25 U/L (ref 0–37)
Albumin: 4.4 g/dL (ref 3.5–5.2)
Alkaline Phosphatase: 87 U/L (ref 39–117)
BUN: 38 mg/dL — ABNORMAL HIGH (ref 6–23)
CO2: 29 mEq/L (ref 19–32)
Calcium: 9.6 mg/dL (ref 8.4–10.5)
Chloride: 103 mEq/L (ref 96–112)
Creatinine, Ser: 1.32 mg/dL (ref 0.40–1.50)
GFR: 51.31 mL/min — ABNORMAL LOW (ref >60.00)
Glucose, Bld: 86 mg/dL (ref 70–99)
Potassium: 5.2 mEq/L — ABNORMAL HIGH (ref 3.5–5.1)
Sodium: 138 mEq/L (ref 135–145)
Total Bilirubin: 0.6 mg/dL (ref 0.2–1.2)
Total Protein: 7.2 g/dL (ref 6.0–8.3)

## 2020-10-14 LAB — LIPID PANEL
Cholesterol: 129 mg/dL (ref 0–200)
HDL: 39.7 mg/dL (ref >39.00)
LDL Cholesterol: 60 mg/dL (ref 0–99)
NonHDL: 89.25
Total CHOL/HDL Ratio: 3
Triglycerides: 148 mg/dL (ref 0.0–149.0)
VLDL: 29.6 mg/dL (ref 0.0–40.0)

## 2020-10-14 LAB — HEMOGLOBIN A1C: Hgb A1c MFr Bld: 6.1 % (ref 4.6–6.5)

## 2020-10-14 MED ORDER — CELECOXIB 200 MG PO CAPS
ORAL_CAPSULE | ORAL | 6 refills | Status: DC
Start: 2020-10-14 — End: 2021-01-31

## 2020-10-14 NOTE — Telephone Encounter (Signed)
Dr. Anitra Lauth entered two orders for US Venous Img Lower Unilateral Right (DVT), one for STAT and one for standard. Per [facility] scheduling, they will call patient to schedule asap but wanted to know if the other order is for later or if it was duplicate. Please investigate and delete second order if appropriate.

## 2020-10-14 NOTE — Telephone Encounter (Signed)
I did not mean to order the STAT one.  The STAT one can be cancelled.-thx

## 2020-10-14 NOTE — Telephone Encounter (Signed)
I believe order is already deleted please confirm

## 2020-10-14 NOTE — Telephone Encounter (Signed)
Please advise 

## 2020-10-14 NOTE — Telephone Encounter (Signed)
Yes, one order has been cancelled.

## 2020-10-14 NOTE — Progress Notes (Signed)
OFFICE VISIT  10/14/2020  CC:  Chief Complaint  Patient presents with  . Follow-up    RCI, 6 mo. Not fasting   HPI:    Patient is a 80 y.o. Caucasian male who presents for 6 mo f/u HTN, HLD, prediabetes. A/P as of last visit: "1) HTN: stable. Continue lisinopril. BMET today.  2) HLD: tolerating statin. LDL 65 one year ago. FLP and hepatic panel today.  3) Prediabetes: improving diet and has lost some wt. Fasting glucose and Hba1c today.  4) LE edema: chronic, reminded him to limit Na intake.  5) Osteoarthritis L shoulder: responded well to ortho doing [place] steroid injection 10/2019. Takes celebrex qAM b/c sleeping on it at night causes pain in mornings. Good in day and evening after that, though. Monitor renal function.  6) Preventative health: Covid UTD.   Tdap due->rx sent to pharmacy for this today. FLu vaccine->given today."  INTERIM HX: C/o R lower leg pain and swelling x about 6 mo, insidious onset.  Doesn't recall any injury.  No knee pain. A little better with dec salt intake. Better when walking.  HTN: taking lisin 91m qd. Three home bp  Checks ths year: syst 141-147, diast 64-74, HR 56-66.  No dietary mod other than low Na lately. Taking atorva 214mqd. He ate coffee w cream and sugar and toast 4 hours ago.  Taking celebrex qd for L shouler pain, says he is not sure if it is helping.  Laying on shoulder seems to be his biggest issue, not bothering him much otherwise.  ROS as above, plus--> no fevers, no CP, no SOB, no wheezing, no cough, no dizziness, no HAs, no rashes, no melena/hematochezia.  No polyuria or polydipsia.  No myalgias or arthralgias.  No focal weakness, paresthesias, or tremors.  No acute vision or hearing abnormalities.  No dysuria or unusual/new urinary urgency or frequency.  No n/v/d or abd pain.  No palpitations.    Past Medical History:  Diagnosis Date  . 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 Diverticulosis 2013   noted on colonoscopy  . Fatty liver 05/2012   u/s done for mild/persistent elevation of LFTs  . Hay fever    no issues now-? out grown  . Hearing impairment    11/2015 Audiology eval= not yet ready for hearing aids ([facility])  . History of adenomatous polyp of colon    On multiple colonoscopies: most recent colonoscopy 05/31/18-->adenoma x 2, repeat 3-5 ys is optional.  . History of basal cell carcinoma of skin   . HTN (hypertension)   . Hyperlipidemia   . Lumbar spondylosis 04/2013   with grade I spondylolisthesis L4 on L5.  . Microhematuria 09/2017   On dipstick only.  Urine micro 09/2017 and 04/2018-->no RBCs.  . OAB (overactive bladder)    ditropan xl 10 ineffective--pt d/c'd this 09/2018  . Obesity   . 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 Prediabetes    [facility] Rectal leakage     Past Surgical History:  Procedure Laterality Date  . COLONOSCOPY     2008 w/polyps,  09-22-2011 w/polyps.  01/2015 tubular adenoma x 1.  05/31/18 colonoscopy w/ two tubular adenomas.  . COLONOSCOPY W/ POLYPECTOMY  2008; 09/22/11;01/2015   Tubular adenoma--recall 3 yrs [place]  Then [facility]  Marland Kitchen POLYPECTOMY    . UMBILICAL HERNIA REPAIR  2011    Outpatient Medications Prior to Visit  Medication Sig Dispense Refill  . atorvastatin (LIPITOR) 20 MG tablet TAKE 1 TABLET BY MOUTH EVERY DAY 90 tablet 1  . Cholecalciferol (VITAMIN D3 PO) Take 1 tablet by mouth daily.    Marland Kitchen lisinopril (ZESTRIL) 30 MG tablet TAKE 1 TABLET BY MOUTH EVERY DAY 90 tablet 1  . celecoxib (CELEBREX) 200 MG capsule TAKE 1 CAPSULE BY MOUTH EVERY DAY 30 capsule 6  . losartan (COZAAR) 25 MG tablet Take by mouth.     No facility-administered medications prior to visit.    No Known Allergies  ROS As per HPI  PE: Vitals with BMI  10/14/2020 06/09/2020 04/15/2020  Height 5' 7"  5' 7"  5' 7"   Weight 232 lbs 233 lbs 226 lbs 13 oz  BMI 36.33 25.85 27.78  Systolic 242 353 614  Diastolic 58 80 65  Pulse 63 61 65     Gen: Alert, well appearing.  Patient is oriented to person, place, time, and situation. AFFECT: pleasant, lucid thought and speech. CV: RRR, no m/r/g.   LUNGS: CTA bilat, nonlabored resps, good aeration in all lung fields. EXT: no clubbing or cyanosis.  3+ bilat LL pitting edema (R a bit worse than L). R calf circumference 10 cm below inf border of patella is 40.5 cm and L is 39 cm. No calf tenderness.  No popliteal fullness, pain, or mass.   LABS:  Lab Results  Component Value Date   TSH 1.23 08/13/2019   Lab Results  Component Value Date   WBC 10.8 (H) 07/09/2017   HGB 15.4 07/09/2017   HCT 46.0 07/09/2017   MCV 90.2 07/09/2017   PLT 157 07/09/2017   Lab Results  Component Value Date   CREATININE 1.15 04/15/2020   BUN 25 (H) 04/15/2020   NA 138 04/15/2020   K 4.5 04/15/2020   CL 103 04/15/2020   CO2 28 04/15/2020   Lab Results  Component Value Date   ALT 30 04/15/2020   AST 23 04/15/2020   ALKPHOS 87 04/15/2020   BILITOT 0.9 04/15/2020   Lab Results  Component Value Date   CHOL 132 04/15/2020   Lab Results  Component Value Date   HDL 40.70 04/15/2020   Lab Results  Component Value Date   LDLCALC 70 04/15/2020   Lab Results  Component Value Date   TRIG 108.0 04/15/2020   Lab Results  Component Value Date   CHOLHDL 3 04/15/2020   Lab Results  Component Value Date   PSA 0.90 09/18/2017   PSA 0.75 10/14/2014   PSA 0.84 09/29/2013   Lab Results  Component Value Date   HGBA1C 5.8 04/15/2020   IMPRESSION AND PLAN:  1) Right lower leg pain and swelling: superimposed on chronic venous insufficiency edema.  R/o DVT-->ultrasound ordered today. Cont low Na intake.  Start compression stockings. Needs to exercise more.  2) HTN: home systolics mild elev but today is 100  so I'm not going to change bp mgmt. Cont lisinopril 36m qd. Lytes/cr today.  3) HLD: tolerating atorva 266mqd. LDL was 70 six mo ago. FLP and hepatic panel today.  4) Prediabetes: not making much change in the area of diet and exercise except for lowering Na intake. Glucose level and Hba1c monitoring today.  5) Preventative health care:  Vaccines: All UTD. Colon ca screening: rpt colonoscopy in the next  couple years "optional" per GI. Prostate ca screening: PSA consistently normal, most recent check was done at age 41-->recommend no further screening d/t age.  An After Visit Summary was printed and given to the patient.  FOLLOW UP: Return in about 6 months (around 04/15/2021) for routine chronic illness f/u.  Signed:  Crissie Sickles, MD           10/14/2020

## 2020-10-14 NOTE — Patient Instructions (Signed)
Buy over the counter compression stockings and wear on both lower legs for 4-6 hours per day

## 2020-10-14 NOTE — Addendum Note (Signed)
Addended by: Octaviano Glow on: 10/14/2020 11:45 AM   Modules accepted: Orders

## 2020-10-16 ENCOUNTER — Ambulatory Visit (HOSPITAL_BASED_OUTPATIENT_CLINIC_OR_DEPARTMENT_OTHER)
Admission: RE | Admit: 2020-10-16 | Discharge: 2020-10-16 | Disposition: A | Discharge status: Home / Self Care | Payer: Medicare Other | Source: Ambulatory Visit | Attending: Family Medicine | Admitting: Family Medicine

## 2020-10-16 ENCOUNTER — Other Ambulatory Visit: Payer: Self-pay

## 2020-10-16 DIAGNOSIS — M79661 Pain in right lower leg: Secondary | ICD-10-CM | POA: Insufficient documentation

## 2020-10-16 DIAGNOSIS — I872 Venous insufficiency (chronic) (peripheral): Secondary | ICD-10-CM | POA: Insufficient documentation

## 2020-10-16 DIAGNOSIS — M7989 Other specified soft tissue disorders: Secondary | ICD-10-CM | POA: Diagnosis not present

## 2020-10-16 DIAGNOSIS — R600 Localized edema: Secondary | ICD-10-CM | POA: Diagnosis not present

## 2020-10-16 DIAGNOSIS — M79604 Pain in right leg: Secondary | ICD-10-CM | POA: Diagnosis not present

## 2020-11-02 ENCOUNTER — Other Ambulatory Visit: Payer: Self-pay

## 2020-11-02 ENCOUNTER — Ambulatory Visit (INDEPENDENT_AMBULATORY_CARE_PROVIDER_SITE_OTHER): Payer: Medicare Other

## 2020-11-02 DIAGNOSIS — E875 Hyperkalemia: Secondary | ICD-10-CM

## 2020-11-02 LAB — BASIC METABOLIC PANEL
BUN: 30 mg/dL — ABNORMAL HIGH (ref 6–23)
CO2: 28 mEq/L (ref 19–32)
Calcium: 9.5 mg/dL (ref 8.4–10.5)
Chloride: 101 mEq/L (ref 96–112)
Creatinine, Ser: 1.38 mg/dL (ref 0.40–1.50)
GFR: 48.63 mL/min — ABNORMAL LOW (ref >60.00)
Glucose, Bld: 86 mg/dL (ref 70–99)
Potassium: 4.7 mEq/L (ref 3.5–5.1)
Sodium: 136 mEq/L (ref 135–145)

## 2020-11-30 ENCOUNTER — Ambulatory Visit (INDEPENDENT_AMBULATORY_CARE_PROVIDER_SITE_OTHER): Payer: Medicare Other | Admitting: Podiatry

## 2020-11-30 ENCOUNTER — Other Ambulatory Visit: Payer: Self-pay

## 2020-11-30 DIAGNOSIS — B351 Tinea unguium: Secondary | ICD-10-CM

## 2020-11-30 DIAGNOSIS — M79674 Pain in right toe(s): Secondary | ICD-10-CM | POA: Diagnosis not present

## 2020-11-30 DIAGNOSIS — M79675 Pain in left toe(s): Secondary | ICD-10-CM | POA: Diagnosis not present

## 2020-12-01 ENCOUNTER — Other Ambulatory Visit: Payer: Self-pay | Admitting: Family Medicine

## 2020-12-05 ENCOUNTER — Encounter: Payer: Self-pay | Admitting: Podiatry

## 2020-12-05 NOTE — Progress Notes (Signed)
Subjective: Gregory Blevins is a pleasant 80 y.o. male patient seen today with h/o PAD and painful thick toenails that are difficult to trim. Pain interferes with ambulation. Aggravating factors include wearing enclosed shoe gear. Pain is relieved with periodic professional debridement.  His states he missed an appointment and his wife cut his toenails. He relates no new pedal problems on today's visit.  PCP is [name], [name], MD. Last visit was: 04/15/2020.  No Known Allergies  Objective: Physical Exam  General: Gregory Blevins is a pleasant 80 y.o. Caucasian male, in NAD. AAO x 3.   Vascular:  Neurovascular status unchanged b/l lower extremities. Capillary refill time to digits <4 seconds b/l lower extremities. Faintly palpable DP pulse(s) b/l lower extremities. Nonpalpable PT pulse(s) b/l lower extremities. Pedal hair absent. Lower extremity skin temperature gradient within normal limits.  Dermatological:  Pedal skin with normal turgor, texture and tone bilaterally. No open wounds bilaterally. No interdigital macerations bilaterally. Toenails 1-5 b/l elongated, discolored, dystrophic, thickened, crumbly with subungual debris and tenderness to dorsal palpation.  Musculoskeletal:  Normal muscle strength 5/5 to all lower extremity muscle groups bilaterally. No gross bony deformities bilaterally. Limited joint ROM to the 1st MPJ b/l.  Neurological:  Protective sensation intact 5/5 intact bilaterally with 10g monofilament b/l. Vibratory sensation intact b/l.  Assessment and Plan:  1. Pain due to onychomycosis of toenails of both feet     -Examined patient. -Patient to continue soft, supportive shoe gear daily. -Toenails 1-5 b/l were debrided in length and girth with sterile nail nippers and dremel without iatrogenic bleeding.  -Patient to report any pedal injuries to medical professional immediately. -Patient/POA to call should there be question/concern in the interim.  Return  in about 3 months (around 03/02/2021) for diabetic nail trim.  Marzetta Board, DPM

## 2020-12-22 ENCOUNTER — Other Ambulatory Visit: Payer: Self-pay | Admitting: Family Medicine

## 2021-01-31 ENCOUNTER — Other Ambulatory Visit: Payer: Self-pay

## 2021-01-31 ENCOUNTER — Ambulatory Visit (INDEPENDENT_AMBULATORY_CARE_PROVIDER_SITE_OTHER): Payer: Medicare Other | Admitting: Family Medicine

## 2021-01-31 ENCOUNTER — Encounter: Payer: Self-pay | Admitting: Family Medicine

## 2021-01-31 VITALS — BP 120/87 | HR 72 | Temp 98.7°F | Resp 16 | Ht 67.0 in | Wt 233.0 lb

## 2021-01-31 DIAGNOSIS — N183 Chronic kidney disease, stage 3 unspecified: Secondary | ICD-10-CM

## 2021-01-31 DIAGNOSIS — I872 Venous insufficiency (chronic) (peripheral): Secondary | ICD-10-CM

## 2021-01-31 DIAGNOSIS — R600 Localized edema: Secondary | ICD-10-CM

## 2021-01-31 MED ORDER — FUROSEMIDE 20 MG PO TABS: ORAL_TABLET | ORAL | 1 refills | Status: DC

## 2021-01-31 NOTE — Progress Notes (Signed)
OFFICE VISIT  01/31/2021  CC:  Chief Complaint  Patient presents with   Leg Swelling    Right; 3 months. Has been unable to get compression stockings on    HPI:    Patient is a 80 y.o. Caucasian male who presents for right leg swelling. I saw him for this problem on 10/14/20-->"Right lower leg pain and swelling: superimposed on chronic venous insufficiency edema.  R/o DVT-->ultrasound ordered today. Cont low Na intake.  Start compression stockings. Needs to exercise more." 10/16/20 right LE venous doppler u/s NEG for DVT.  CURRENT HX: Compression stockings help some but he finds them very difficult to get on even with his wife's help. No pain.  Eating lowNa diet now. The legs don't hurt, just some tightness in back of R knee as per chronic for him.   Left shoulder still bothering him particularly when trying to sleep/lay down on it. Not taking celebrex b/c no help.  Takes tylenol 1g prn and it helps some.    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 out grown   Hearing impairment    11/2015 Audiology eval= not yet ready for hearing aids ([facility])   History of adenomatous polyp of colon    On multiple colonoscopies: most recent colonoscopy 05/31/18-->adenoma x 2, repeat 3-5 ys is optional.   History of basal cell carcinoma of skin    HTN (hypertension)    Hyperlipidemia    Lumbar spondylosis 04/2013   with grade I spondylolisthesis L4 on L5.   Microhematuria 09/2017   On dipstick only.  Urine micro 09/2017 and 04/2018-->no RBCs.   OAB (overactive bladder)    ditropan xl 10 ineffective--pt d/c'd this 09/2018   Obesity    Osteoarthritis    Hips (L>R on x-ray 04/2013), shoulders, and knees (hx of adhesive capsulitis of shoulder)    Prediabetes    A1c 6.1% 08/2019 at [facility]   Rectal leakage     Past Surgical History:  Procedure Laterality Date   COLONOSCOPY     2008 w/polyps,  09-22-2011 w/polyps.  01/2015 tubular adenoma x 1.  05/31/18 colonoscopy w/ two tubular adenomas.   COLONOSCOPY W/ POLYPECTOMY  2008; 09/22/11;01/2015   Tubular adenoma--recall 3 yrs per Dr. Louanne Belton at [place].  Then switched to Dr. Hilarie Fredrickson with [facility].   POLYPECTOMY     UMBILICAL HERNIA REPAIR  2011    Outpatient Medications Prior to Visit  Medication Sig Dispense Refill   Acetaminophen (TYLENOL PO) Take 500 mg by mouth 2 (two) times daily.     atorvastatin (LIPITOR) 20 MG tablet TAKE 1 TABLET BY MOUTH EVERY DAY 90 tablet 0   Cholecalciferol (VITAMIN D3 PO) Take 1 tablet by mouth daily.     lisinopril (ZESTRIL) 30 MG tablet TAKE 1 TABLET BY MOUTH EVERY DAY 90 tablet 1   celecoxib (CELEBREX) 200 MG capsule TAKE 1 CAPSULE BY MOUTH EVERY DAY (Patient not taking: Reported on 01/31/2021) 30 capsule 6   No facility-administered medications prior to visit.    No Known Allergies  ROS As per HPI  PE: Vitals with BMI 01/31/2021 10/14/2020 06/09/2020  Height 5' 7"  5' 7"  5' 7"   Weight 233 lbs 232 lbs 233  lbs  BMI 36.48 08.67 61.95  Systolic 093 267 124  Diastolic 87 58 80  Pulse 72 63 61  Gen: Alert, well appearing.  Patient is oriented to person, place, time, and situation. AFFECT: pleasant, lucid thought and speech. CV: RRR, no m/r/g.   LUNGS: CTA bilat, nonlabored resps, good aeration in all lung fields. EXT: no clubbing or cyanosis.  3+ RLL pitting edema, 2+ L LL pitting edema No skin abnormalities. R calf circ 10 cm below inf border patella is 39.5, L side is 38.5   LABS:    Chemistry      Component Value Date/Time   NA 136 11/02/2020 0921   K 4.7 11/02/2020 0921   CL 101 11/02/2020 0921   CO2 28 11/02/2020 0921   BUN 30 (H) 11/02/2020 0921   CREATININE 1.38 11/02/2020 0921      Component Value Date/Time    CALCIUM 9.5 11/02/2020 0921   ALKPHOS 87 10/14/2020 1002   AST 25 10/14/2020 1002   ALT 37 10/14/2020 1002   BILITOT 0.6 10/14/2020 1002     Lab Results  Component Value Date   WBC 10.8 (H) 07/09/2017   HGB 15.4 07/09/2017   HCT 46.0 07/09/2017   MCV 90.2 07/09/2017   PLT 157 07/09/2017   Lab Results  Component Value Date   HGBA1C 6.1 10/14/2020   IMPRESSION AND PLAN:  Bilat LL edema secondary to chronic venous insufficiency, in the setting of CRI III. He is actually having just a little be less edema than when I saw him 3 mo ago. We discussed easier position to get compression stockings on, plus there is a device he can get online to help with getting them on. He'll continue his greatly improved low Na diet. Will proceed with cautious use of furosemide:  45m qd prn-->I recommended he use it qd for the next 2-3 d and I'll check bmet today and again in 4d.  An After Visit Summary was printed and given to the patient.  FOLLOW UP: Return in about 2 weeks (around 02/14/2021) for f/u legs swelling.  Signed:  PCrissie Sickles MD           01/31/2021

## 2021-02-01 LAB — BASIC METABOLIC PANEL
BUN: 27 mg/dL — ABNORMAL HIGH (ref 6–23)
CO2: 28 mEq/L (ref 19–32)
Calcium: 9 mg/dL (ref 8.4–10.5)
Chloride: 104 mEq/L (ref 96–112)
Creatinine, Ser: 1.13 mg/dL (ref 0.40–1.50)
GFR: 61.7 mL/min (ref >60.00)
Glucose, Bld: 114 mg/dL — ABNORMAL HIGH (ref 70–99)
Potassium: 4.7 mEq/L (ref 3.5–5.1)
Sodium: 138 mEq/L (ref 135–145)

## 2021-02-03 ENCOUNTER — Other Ambulatory Visit: Payer: Self-pay

## 2021-02-03 ENCOUNTER — Ambulatory Visit (INDEPENDENT_AMBULATORY_CARE_PROVIDER_SITE_OTHER): Payer: Medicare Other

## 2021-02-03 DIAGNOSIS — R600 Localized edema: Secondary | ICD-10-CM | POA: Diagnosis not present

## 2021-02-03 DIAGNOSIS — I872 Venous insufficiency (chronic) (peripheral): Secondary | ICD-10-CM | POA: Diagnosis not present

## 2021-02-03 DIAGNOSIS — N183 Chronic kidney disease, stage 3 unspecified: Secondary | ICD-10-CM

## 2021-02-03 LAB — BASIC METABOLIC PANEL
BUN: 26 mg/dL — ABNORMAL HIGH (ref 6–23)
CO2: 22 mEq/L (ref 19–32)
Calcium: 9.3 mg/dL (ref 8.4–10.5)
Chloride: 103 mEq/L (ref 96–112)
Creatinine, Ser: 1.15 mg/dL (ref 0.40–1.50)
GFR: 60.41 mL/min (ref >60.00)
Glucose, Bld: 80 mg/dL (ref 70–99)
Potassium: 4.8 mEq/L (ref 3.5–5.1)
Sodium: 136 mEq/L (ref 135–145)

## 2021-02-03 NOTE — Progress Notes (Signed)
Per the orders of Dr. Anitra Lauth, pt ishere for labs, pt tolerated draw well.

## 2021-03-02 ENCOUNTER — Encounter: Payer: Self-pay | Admitting: Podiatry

## 2021-03-02 ENCOUNTER — Ambulatory Visit (INDEPENDENT_AMBULATORY_CARE_PROVIDER_SITE_OTHER): Payer: Medicare Other | Admitting: Podiatry

## 2021-03-02 ENCOUNTER — Other Ambulatory Visit: Payer: Self-pay

## 2021-03-02 DIAGNOSIS — B351 Tinea unguium: Secondary | ICD-10-CM | POA: Diagnosis not present

## 2021-03-02 DIAGNOSIS — M79675 Pain in left toe(s): Secondary | ICD-10-CM

## 2021-03-02 DIAGNOSIS — M79674 Pain in right toe(s): Secondary | ICD-10-CM | POA: Diagnosis not present

## 2021-03-06 NOTE — Progress Notes (Signed)
  Subjective:  Patient ID: Gregory Blevins, male    DOB: [birth year],  MRN: [id number]  Gregory Blevins presents to clinic today for painful thick toenails that are difficult to trim. Pain interferes with ambulation. Aggravating factors include wearing enclosed shoe gear. Pain is relieved with periodic professional debridement.  PCP is [name], [name], MD , and last visit was 01/31/2021.  He voices no new  pedal problems on today's visit.Marland Kitchen  No Known Allergies  Review of Systems: Negative except as noted in the HPI. Objective:   Constitutional Gregory Blevins is a pleasant 80 y.o. Caucasian male, morbidly obese in NAD. AAO x 3.   Vascular Capillary refill time to digits <4 seconds b/l lower extremities. Faintly palpable DP pulse(s) b/l lower extremities. Nonpalpable PT pulse(s) b/l lower extremities. Pedal hair absent. Lower extremity skin temperature gradient within normal limits. No pain with calf compression b/l. Trace edema noted b/l lower extremities. No cyanosis or clubbing noted.  Neurologic Normal speech. Oriented to person, place, and time. Protective sensation intact 5/5 intact bilaterally with 10g monofilament b/l.  Dermatologic Skin warm and supple b/l lower extremities. No open wounds left lower extremity. No interdigital macerations b/l lower extremities. Toenails 1-5 b/l elongated, discolored, dystrophic, thickened, crumbly with subungual debris and tenderness to dorsal palpation.  Orthopedic: Normal muscle strength 5/5 to all lower extremity muscle groups bilaterally. Limited joint ROM to the 1st MPJ b/l.   Radiographs: None Assessment:   1. Pain due to onychomycosis of toenails of both feet    Plan:  -No new findings. No new orders. -Patient to continue soft, supportive shoe gear daily. -Toenails 1-5 b/l were debrided in length and girth with sterile nail nippers and dremel without iatrogenic bleeding.  -Patient to report any pedal injuries to medical professional  immediately. -Patient/POA to call should there be question/concern in the interim.  Return in about 3 months (around 06/02/2021).  Marzetta Board, DPM

## 2021-03-21 ENCOUNTER — Other Ambulatory Visit: Payer: Self-pay | Admitting: Family Medicine

## 2021-04-15 ENCOUNTER — Encounter: Payer: Self-pay | Admitting: Family Medicine

## 2021-04-15 ENCOUNTER — Other Ambulatory Visit: Payer: Self-pay

## 2021-04-15 ENCOUNTER — Ambulatory Visit (INDEPENDENT_AMBULATORY_CARE_PROVIDER_SITE_OTHER): Payer: Medicare Other | Admitting: Family Medicine

## 2021-04-15 VITALS — BP 117/74 | HR 72 | Temp 98.0°F | Ht 67.0 in | Wt 229.4 lb

## 2021-04-15 DIAGNOSIS — E7800 Pure hypercholesterolemia, unspecified: Secondary | ICD-10-CM | POA: Diagnosis not present

## 2021-04-15 DIAGNOSIS — R7303 Prediabetes: Secondary | ICD-10-CM

## 2021-04-15 DIAGNOSIS — R600 Localized edema: Secondary | ICD-10-CM

## 2021-04-15 DIAGNOSIS — Z23 Encounter for immunization: Secondary | ICD-10-CM

## 2021-04-15 DIAGNOSIS — I10 Essential (primary) hypertension: Secondary | ICD-10-CM

## 2021-04-15 LAB — LIPID PANEL
Cholesterol: 125 mg/dL (ref 0–200)
HDL: 43.6 mg/dL (ref >39.00)
LDL Cholesterol: 57 mg/dL (ref 0–99)
NonHDL: 81.3
Total CHOL/HDL Ratio: 3
Triglycerides: 121 mg/dL (ref 0.0–149.0)
VLDL: 24.2 mg/dL (ref 0.0–40.0)

## 2021-04-15 LAB — COMPREHENSIVE METABOLIC PANEL
ALT: 41 U/L (ref 0–53)
AST: 24 U/L (ref 0–37)
Albumin: 4.4 g/dL (ref 3.5–5.2)
Alkaline Phosphatase: 80 U/L (ref 39–117)
BUN: 38 mg/dL — ABNORMAL HIGH (ref 6–23)
CO2: 27 mEq/L (ref 19–32)
Calcium: 9.4 mg/dL (ref 8.4–10.5)
Chloride: 103 mEq/L (ref 96–112)
Creatinine, Ser: 1.25 mg/dL (ref 0.40–1.50)
GFR: 54.58 mL/min — ABNORMAL LOW (ref >60.00)
Glucose, Bld: 84 mg/dL (ref 70–99)
Potassium: 5.1 mEq/L (ref 3.5–5.1)
Sodium: 139 mEq/L (ref 135–145)
Total Bilirubin: 0.6 mg/dL (ref 0.2–1.2)
Total Protein: 7.3 g/dL (ref 6.0–8.3)

## 2021-04-15 LAB — HEMOGLOBIN A1C: Hgb A1c MFr Bld: 6 % (ref 4.6–6.5)

## 2021-04-15 MED ORDER — FUROSEMIDE 20 MG PO TABS: ORAL_TABLET | ORAL | 3 refills | Status: DC

## 2021-04-15 NOTE — Progress Notes (Signed)
OFFICE VISIT  04/15/2021  CC:  Chief Complaint  Patient presents with   Follow-up    RCI, pt is fasting    HPI:    Patient is a 80 y.o. male who presents for 6 mo f/u HTN, DM, bilat LE edema, and prediabetes. A/P as of last visit on 01/31/21: "Bilat LL edema secondary to chronic venous insufficiency, in the setting of CRI III. He is actually having just a little be less edema than when I saw him 3 mo ago. We discussed easier position to get compression stockings on, plus there is a device he can get online to help with getting them on. He'll continue his greatly improved low Na diet. Will proceed with cautious use of furosemide:  40m qd prn-->I recommended he use it qd for the next 2-3 d and I'll check bmet today and again in 4d."  INTERIM HX: Doing ok, remains pretty active. Home bp monitoring: none.  Legs with minimal swelling, not wearing comp stockings much but taking lasix every other day. Trying to minimize salt.  L shoulder pain still bothersome mainly when laying on it at night.  ROM signif impaired c/w adhesive capsulitis. Takes 5060mtylenol bid and not helping much.   Has not eaten in 6 hrs.  ROS as above, plus--> no fevers, no CP, no SOB, no wheezing, no cough, no dizziness, no HAs, no rashes, no melena/hematochezia.  No polyuria or polydipsia.  No myalgias .  No focal weakness, paresthesias, or tremors.  No acute vision or hearing abnormalities.  No dysuria or unusual/new urinary urgency or frequency.  No recent changes in lower legs. No n/v/d or abd pain.  No palpitations.     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 out grown   Hearing impairment    11/2015 Audiology eval= not yet ready for hearing aids ([facility])    History of adenomatous polyp of colon    On multiple colonoscopies: most recent colonoscopy 05/31/18-->adenoma x 2, repeat 3-5 ys is optional.   History of basal cell carcinoma of skin    HTN (hypertension)    Hyperlipidemia    Lumbar spondylosis 04/2013   with grade I spondylolisthesis L4 on L5.   Microhematuria 09/2017   On dipstick only.  Urine micro 09/2017 and 04/2018-->no RBCs.   OAB (overactive bladder)    ditropan xl 10 ineffective--pt d/c'd this 09/2018   Obesity    Osteoarthritis    Hips (L>R on x-ray 04/2013), shoulders, and knees (hx of adhesive capsulitis of shoulder)   Prediabetes    [facility]     Past Surgical History:  Procedure Laterality Date   COLONOSCOPY     2008 w/polyps,  09-22-2011 w/polyps.  01/2015 tubular adenoma x 1.  05/31/18 colonoscopy w/ two tubular adenomas.   COLONOSCOPY W/ POLYPECTOMY  2008; 09/22/11;01/2015   Tubular adenoma--recall 3 yrs per Dr. NoLouanne Beltont NoWilder Then switched to Dr. PyHilarie Fredricksonith Hatton.   POLYPECTOMY     UMBILICAL HERNIA REPAIR  2011    Outpatient Medications Prior to Visit  Medication Sig Dispense Refill   Acetaminophen (TYLENOL PO) Take 500 mg by mouth 2 (  two) times daily.     atorvastatin (LIPITOR) 20 MG tablet TAKE 1 TABLET BY MOUTH EVERY DAY 90 tablet 1   Cholecalciferol (VITAMIN D3 PO) Take 1 tablet by mouth daily.     lisinopril (ZESTRIL) 30 MG tablet TAKE 1 TABLET BY MOUTH EVERY DAY 90 tablet 1   furosemide (LASIX) 20 MG tablet 1 tab po once a day AS NEEDED for lower legs swelling 20 tablet 1   No facility-administered medications prior to visit.    No Known Allergies  ROS As per HPI  PE: Vitals with BMI 04/15/2021 01/31/2021 10/14/2020  Height 5' 7"  5' 7"  5' 7"   Weight 229 lbs 6 oz 233 lbs 232 lbs  BMI 35.92 16.38 46.65  Systolic 993 570 177  Diastolic 74 87 58  Pulse 72 72 63   Gen: Alert, well appearing.  Patient is oriented to person, place, time, and  situation. AFFECT: pleasant, lucid thought and speech. CV: RRR, no m/r/g.   LUNGS: CTA bilat, nonlabored resps, good aeration in all lung fields. EXT: no clubbing or cyanosis.  3+ bilat LL pitting edema.    LABS:  Lab Results  Component Value Date   TSH 1.23 08/13/2019   Lab Results  Component Value Date   WBC 10.8 (H) 07/09/2017   HGB 15.4 07/09/2017   HCT 46.0 07/09/2017   MCV 90.2 07/09/2017   PLT 157 07/09/2017   Lab Results  Component Value Date   CREATININE 1.15 02/03/2021   BUN 26 (H) 02/03/2021   NA 136 02/03/2021   K 4.8 02/03/2021   CL 103 02/03/2021   CO2 22 02/03/2021   Lab Results  Component Value Date   ALT 37 10/14/2020   AST 25 10/14/2020   ALKPHOS 87 10/14/2020   BILITOT 0.6 10/14/2020   Lab Results  Component Value Date   CHOL 129 10/14/2020   Lab Results  Component Value Date   HDL 39.70 10/14/2020   Lab Results  Component Value Date   LDLCALC 60 10/14/2020   Lab Results  Component Value Date   TRIG 148.0 10/14/2020   Lab Results  Component Value Date   CHOLHDL 3 10/14/2020   Lab Results  Component Value Date   PSA 0.90 09/18/2017   PSA 0.75 10/14/2014   PSA 0.84 09/29/2013   Lab Results  Component Value Date   HGBA1C 6.1 10/14/2020    IMPRESSION AND PLAN:  1) HTN: well controlled on zestril 30 qd. Lytes/cr today.  2) HLD: tolerating atorva 20 qd. LDL was 60 in April this year. FLP and hepatic panel today.  He last ate 6 hours ago.  3) LE edema d/t venous insufficiency. Stable: cont compression stockings, low Na diet, elevation prn, and lasix 81m every other day. Lytes/cr today.  4) Preventative health care:  Vaccines: Flu->given today.  Otherwise All UTD. Colon ca screening: rpt colonoscopy in the next couple years "optional" per GI. Prostate ca screening: PSA consistently normal, most recent check was done at age 84-->recommend no further screening d/t age  80 L shoulder adhesive capsulitis. He got GH  injection approx 1 yr ago. He will try increase tylenol to 1000 bid and he'll f/u with Dr. DMarlou Sain ortho [facility].  6) Prediabetes: a1c 6.1% six mo ago. Rpt Hba1c and fasting glucose today.  An After Visit Summary was printed and given to the patient.  FOLLOW UP: Return in about 6 months (around 10/14/2021) for routine chronic  illness f/u.  Signed:  Crissie Sickles, MD           04/15/2021

## 2021-04-18 ENCOUNTER — Telehealth: Payer: Self-pay

## 2021-04-18 NOTE — Telephone Encounter (Signed)
Noted  

## 2021-04-18 NOTE — Telephone Encounter (Signed)
FYI, Spoke with pt regarding results/recommendations,voiced understanding. He mentioned redness at injection site of receiving flu shot. Denies any other symptoms. Advised if no improvement to schedule appt

## 2021-04-18 NOTE — Telephone Encounter (Signed)
-----   Message from Tammi Sou, MD sent at 04/18/2021  9:30 AM EDT ----- All labs stable. No new recommendations.

## 2021-05-02 ENCOUNTER — Ambulatory Visit (INDEPENDENT_AMBULATORY_CARE_PROVIDER_SITE_OTHER): Payer: Medicare Other | Admitting: Orthopedic Surgery

## 2021-05-02 ENCOUNTER — Encounter: Payer: Self-pay | Admitting: Orthopedic Surgery

## 2021-05-02 ENCOUNTER — Other Ambulatory Visit: Payer: Self-pay

## 2021-05-02 DIAGNOSIS — G8929 Other chronic pain: Secondary | ICD-10-CM | POA: Diagnosis not present

## 2021-05-02 DIAGNOSIS — M25512 Pain in left shoulder: Secondary | ICD-10-CM

## 2021-05-05 ENCOUNTER — Encounter: Payer: Self-pay | Admitting: Orthopedic Surgery

## 2021-05-05 NOTE — Progress Notes (Signed)
Office Visit Note   Patient: Gregory Blevins           Date of Birth: 1940/09/09           MRN: [id number] Visit Date: 05/02/2021 Requested by: Tammi Sou, MD [id number] [place],  [place] [address] PCP: Tammi Sou, MD  Subjective: Chief Complaint  Patient presents with   Right Shoulder - Follow-up   Left Shoulder - Follow-up    HPI: Gregory Blevins is an 80 year old patient with bilateral shoulder pain.  He has known history of severe bilateral shoulder arthritis.  2011 radiographs reviewed.  Had last injection a year ago.  Injections do help him for a while.  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  Takes over-the-counter Tylenol for pain which has not really been giving him much relief recently.  No recent falls or injuries.              ROS: All systems reviewed are negative as they relate to the chief complaint within the history of present illness.  Patient denies  fevers or chills.   Assessment & Plan: Visit Diagnoses:  1. Chronic left shoulder pain     Plan: Impression is pretty severe left shoulder arthritis which has done well with injections in the past.  I think he has a significant amount of medialization and glenoid wear from this arthritis which would make reverse replacement technically difficult if it is possible at all based on the amount of glenoid bone stock remaining.  Currently Gregory Blevins wants to continue with injections because they have really helped him to maintain a fairly functional lifestyle.  We will refer him to Dr. Ernestina Patches to continue these fluoroscopically guided left shoulder injections follow-up with [place] as needed.  Follow-Up Instructions: Return if symptoms worsen or fail to improve.   Orders:  No orders of the defined types were placed in this encounter.  No orders of the defined types were placed in this encounter.     Procedures: No procedures performed   Clinical Data: No additional  findings.  Objective: Vital Signs: There were no vitals taken for this visit.  Physical Exam:   Constitutional: Patient appears well-developed HEENT:  Head: Normocephalic Eyes:EOM are normal Neck: Normal range of motion Cardiovascular: Normal rate Pulmonary/chest: Effort normal Neurologic: Patient is alert Skin: Skin is warm Psychiatric: Patient has normal mood and affect   Ortho Exam: Ortho exam demonstrates full active and passive range of motion of the elbow and wrist on the left-hand side.  Passive range of motion of the shoulder is 5/60/85.  Deltoid fires.  Radial pulse intact.  Neck range of motion full.  Specialty Comments:  No specialty comments available.  Imaging: No results found.   PMFS History: Patient Active Problem List   Diagnosis Date Noted   Prostate cancer screening 10/21/2015   Preventative health care 04/10/2014   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/22/2013   Facet arthropathy, lumbar 04/22/2013   Pelvic pain in male 03/21/2013   Health maintenance examination 09/27/2012   HTN (hypertension), benign 09/27/2012   Basal cell carcinoma of skin 07/12/2011   Colon polyp 07/12/2011   High risk medication use 07/12/2011   Elevation of level of transaminase or lactic acid dehydrogenase (LDH) 08/31/2010   Onychomycosis due to dermatophyte 08/31/2010   Osteoarthrosis involving lower leg 08/31/2010   Other and unspecified hyperlipidemia 08/31/2010   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 out grown   Hearing impairment    11/2015 Audiology eval= not yet  ready for hearing aids ([facility])   History of adenomatous polyp of colon    On multiple colonoscopies: most recent colonoscopy 05/31/18-->adenoma x 2, repeat 3-5 ys is optional.   History of basal cell carcinoma of skin    HTN (hypertension)    Hyperlipidemia    Lumbar spondylosis 04/2013   with grade I spondylolisthesis L4 on L5.   Microhematuria 09/2017   On dipstick only.  Urine micro 09/2017 and 04/2018-->no RBCs.   OAB (overactive bladder)    ditropan xl 10 ineffective--pt d/c'd this 09/2018   Obesity    Osteoarthritis    Hips (L>R on x-ray 04/2013), shoulders, and knees (hx of adhesive capsulitis of shoulder)   Prediabetes    A1c 6.1% 08/2019 at [place] leakage     Family History  Problem Relation Age of Onset   Hypertension Father    Alcoholism Father    Stomach cancer Father        primary with mets to colon   Colon cancer Father    Dementia Mother    Asthma Brother    Diabetes Sister    Colon polyps Neg Hx    Esophageal cancer Neg Hx    Rectal cancer Neg Hx     Past Surgical History:  Procedure Laterality Date   COLONOSCOPY     2008 w/polyps,  09-22-2011 w/polyps.  01/2015 tubular adenoma x 1.  05/31/18 colonoscopy w/ two tubular adenomas.   COLONOSCOPY W/ POLYPECTOMY  2008; 09/22/11;01/2015   Tubular adenoma--recall 3 yrs per Dr. Louanne Belton at [place].  Then switched to Dr. Hilarie Fredrickson with .   POLYPECTOMY     UMBILICAL HERNIA REPAIR  2011   Social History   Occupational History   Not on file  Tobacco Use   Smoking status: Never   Smokeless tobacco: Never  Vaping Use   Vaping Use: Never used  Substance and Sexual Activity   Alcohol use: Yes    Alcohol/week: 1.0 standard drink    Types: 1 Cans of beer per week    Comment: occassional   Drug use: No   Sexual activity: Not on file

## 2021-05-23 ENCOUNTER — Ambulatory Visit: Payer: Self-pay

## 2021-05-23 ENCOUNTER — Other Ambulatory Visit: Payer: Self-pay

## 2021-05-23 ENCOUNTER — Ambulatory Visit (INDEPENDENT_AMBULATORY_CARE_PROVIDER_SITE_OTHER): Payer: Medicare Other | Admitting: Physical Medicine and Rehabilitation

## 2021-05-23 ENCOUNTER — Encounter: Payer: Self-pay | Admitting: Physical Medicine and Rehabilitation

## 2021-05-23 DIAGNOSIS — G8929 Other chronic pain: Secondary | ICD-10-CM

## 2021-05-23 NOTE — Progress Notes (Signed)
Pt state left shoulder pain. Pt state any movement with his left arm makes the pain worse. Pt state he takes over the counter pain meds to help ease his pain.  Numeric Pain Rating Scale and Functional Assessment Average Pain 2   In the last MONTH (on 0-10 scale) has pain interfered with the following?  1. General activity like being  able to carry out your everyday physical activities such as walking, climbing stairs, carrying groceries, or moving a chair?  Rating(8)    -BT, -Dye Allergies.

## 2021-05-24 DIAGNOSIS — M25512 Pain in left shoulder: Secondary | ICD-10-CM | POA: Diagnosis not present

## 2021-05-24 DIAGNOSIS — G8929 Other chronic pain: Secondary | ICD-10-CM | POA: Diagnosis not present

## 2021-05-24 MED ORDER — BUPIVACAINE HCL 0.25 % IJ SOLN
5.0000 mL | INTRAMUSCULAR | Status: AC | PRN
  Administered 2021-05-24: 5 mL via INTRA_ARTICULAR

## 2021-05-24 MED ORDER — BUPIVACAINE HCL 0.5 % IJ SOLN
3.0000 mL | INTRAMUSCULAR | Status: AC | PRN
  Administered 2021-05-24: 3 mL via INTRA_ARTICULAR

## 2021-05-24 MED ORDER — TRIAMCINOLONE ACETONIDE 40 MG/ML IJ SUSP
40.0000 mg | INTRAMUSCULAR | Status: AC | PRN
  Administered 2021-05-24: 40 mg via INTRA_ARTICULAR

## 2021-05-24 NOTE — Progress Notes (Signed)
   Gregory Blevins - 80 y.o. male MRN [id number]  Date of birth: 01-11-1941  Office Visit Note: Visit Date: 05/23/2021 PCP: Tammi Sou, MD Referred by: Tammi Sou, MD  Subjective: Chief Complaint  Patient presents with   Left Shoulder - Pain   HPI:  Gregory Blevins is a 80 y.o. male who comes in today at the request of Dr. Anderson Malta for planned Left anesthetic glenohumeral arthrogram with fluoroscopic guidance.  The patient has failed conservative care including home exercise, medications, time and activity modification.  This injection will be diagnostic and hopefully therapeutic.  Please see requesting physician notes for further details and justification.   ROS Otherwise per HPI.  Assessment & Plan: Visit Diagnoses:    ICD-10-CM   1. Chronic left shoulder pain  M25.512 XR C-ARM NO REPORT   G89.29       Plan: No additional findings.   Meds & Orders: No orders of the defined types were placed in this encounter.   Orders Placed This Encounter  Procedures   Large Joint Inj   XR C-ARM NO REPORT    Follow-up: Return for visit to requesting provider as needed.   Procedures: Large Joint Inj: L glenohumeral on 05/24/2021 8:06 AM Indications: pain and diagnostic evaluation Details: 22 G 3.5 in needle, fluoroscopy-guided anteromedial approach  Arthrogram: No  Medications: 3 mL bupivacaine 0.5 %; 40 mg triamcinolone acetonide 40 MG/ML; 5 mL bupivacaine 0.25 % Outcome: tolerated well, no immediate complications  There was excellent flow of contrast producing a partial arthrogram of the glenohumeral joint. The patient did have relief of symptoms during the anesthetic phase of the injection. Procedure, treatment alternatives, risks and benefits explained, specific risks discussed. Consent was given by the patient. Immediately prior to procedure a time out was called to verify the correct patient, procedure, equipment, support staff and site/side marked as  required. Patient was prepped and draped in the usual sterile fashion.         Clinical History: No specialty comments available.     Objective:  VS:  HT:    WT:   BMI:     BP:   HR: bpm  TEMP: ( )  RESP:  Physical Exam   Imaging: XR C-ARM NO REPORT  Result Date: 05/23/2021 Please see Notes tab for imaging impression.

## 2021-05-30 ENCOUNTER — Other Ambulatory Visit: Payer: Self-pay | Admitting: Family Medicine

## 2021-06-14 ENCOUNTER — Other Ambulatory Visit: Payer: Self-pay

## 2021-06-14 ENCOUNTER — Encounter: Payer: Self-pay | Admitting: Podiatry

## 2021-06-14 ENCOUNTER — Ambulatory Visit (INDEPENDENT_AMBULATORY_CARE_PROVIDER_SITE_OTHER): Payer: Medicare Other | Admitting: Podiatry

## 2021-06-14 DIAGNOSIS — M79675 Pain in left toe(s): Secondary | ICD-10-CM | POA: Diagnosis not present

## 2021-06-14 DIAGNOSIS — M79674 Pain in right toe(s): Secondary | ICD-10-CM | POA: Diagnosis not present

## 2021-06-14 DIAGNOSIS — B351 Tinea unguium: Secondary | ICD-10-CM | POA: Diagnosis not present

## 2021-06-15 ENCOUNTER — Ambulatory Visit: Payer: Medicare Other

## 2021-06-15 ENCOUNTER — Ambulatory Visit: Payer: Medicare Other | Admitting: Podiatry

## 2021-06-19 NOTE — Progress Notes (Signed)
Subjective: Gregory Blevins is a 80 y.o. male patient seen today for follow up of  painful thick toenails that are difficult to trim. Pain interferes with ambulation. Aggravating factors include wearing enclosed shoe gear. Pain is relieved with periodic professional debridement.  He relates he enjoyed his trip to [place] for Thanksgiving.  New problems reported today: None.  PCP is [name], [name], MD. Last visit was: 10/077/2022.  No Known Allergies  Objective: Physical Exam  General: Patient is a pleasant 80 y.o. Caucasian male in NAD. AAO x 3.   Neurovascular Examination: CFT <4 seconds b/l LE. Faintly palpable DP pulses b/l LE. Nonpalpable PT pulse(s) b/l LE. Pedal hair absent. No pain with calf compression b/l. Trace edema noted BLE.  Protective sensation intact 5/5 intact bilaterally with 10g monofilament b/l.  Dermatological:  Pedal integument with normal turgor, texture and tone BLE. Toenails 1-5 b/l elongated, discolored, dystrophic, thickened, crumbly with subungual debris and tenderness to dorsal palpation. No hyperkeratotic nor porokeratotic lesions present on today's visit.  Musculoskeletal:  Muscle strength 5/5 to all lower extremity muscle groups bilaterally. No pain, crepitus or joint limitation noted with ROM b/l lower extremities. Limited joint ROM to the 1st MPJ b/l.  Assessment: 1. Pain due to onychomycosis of toenails of both feet    Plan: Patient was evaluated and treated and all questions answered. Consent given for treatment as described below: -No new findings. No new orders. -Mycotic toenails 1-5 bilaterally were debrided in length and girth with sterile nail nippers and dremel without incident. -Patient/POA to call should there be question/concern in the interim.  Return in about 3 months (around 09/12/2021).  Marzetta Board, DPM

## 2021-06-22 ENCOUNTER — Other Ambulatory Visit: Payer: Self-pay

## 2021-06-22 ENCOUNTER — Ambulatory Visit (INDEPENDENT_AMBULATORY_CARE_PROVIDER_SITE_OTHER): Payer: Medicare Other

## 2021-06-22 DIAGNOSIS — Z0000 Encounter for general adult medical examination without abnormal findings: Secondary | ICD-10-CM

## 2021-06-22 NOTE — Progress Notes (Signed)
Virtual Visit via Telephone Note  I connected with  Gregory Blevins on 06/22/21 at 11:00 AM EST by telephone and verified that I am speaking with the correct person using two identifiers.  Medicare Annual Wellness visit completed telephonically due to Covid-19 pandemic.   Persons participating in this call: This Health Coach and this patient.   Location: Patient: home Provider: office   I discussed the limitations, risks, security and privacy concerns of performing an evaluation and management service by telephone and the availability of in person appointments. The patient expressed understanding and agreed to proceed.  Unable to perform video visit due to video visit attempted and failed and/or patient does not have video capability.   Some vital signs may be absent or patient reported.   Willette Brace, LPN   Subjective:   Gregory Blevins is a 80 y.o. male who presents for Medicare Annual/Subsequent preventive examination.  Review of Systems     Cardiac Risk Factors include: advanced age (>84mn, >>37women);hypertension;dyslipidemia;male gender;obesity (BMI >30kg/m2)     Objective:    There were no vitals filed for this visit. There is no height or weight on file to calculate BMI.  Advanced Directives 06/22/2021 06/09/2020 04/11/2019 04/09/2018 07/09/2017 01/22/2015  Does Patient Have a Medical Advance Directive? Yes Yes Yes Yes No Yes  Type of AParamed[place]of [place] will [place] will Living will;Healthcare Power of [place] will  Copy of [place] Chart? - No - copy requested Yes - validated most recent copy scanned in chart (See row information) No - copy requested - -    Current Medications (verified) Outpatient Encounter Medications as of 06/22/2021  Medication Sig   Acetaminophen (TYLENOL PO) Take 500 mg by mouth 2  (two) times daily.   atorvastatin (LIPITOR) 20 MG tablet TAKE 1 TABLET BY MOUTH EVERY DAY   Cholecalciferol (VITAMIN D3 PO) Take 1 tablet by mouth daily.   furosemide (LASIX) 20 MG tablet 1 tab po every other day   lisinopril (ZESTRIL) 30 MG tablet TAKE 1 TABLET BY MOUTH EVERY DAY   No facility-administered encounter medications on file as of 06/22/2021.    Allergies (verified) Patient has no known allergies.   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 out grown   Hearing impairment    11/2015 Audiology eval= not yet ready for hearing aids ([facility])   History of adenomatous polyp of colon    On multiple colonoscopies: most recent colonoscopy 05/31/18-->adenoma x 2, repeat 3-5 ys is optional.   History of basal cell carcinoma of skin    HTN (hypertension)    Hyperlipidemia    Lumbar spondylosis 04/2013   with grade I spondylolisthesis L4 on L5.   Microhematuria 09/2017   On dipstick only.  Urine micro 09/2017 and 04/2018-->no RBCs.   OAB (overactive bladder)    ditropan xl 10 ineffective--pt d/c'd this 09/2018   Obesity    Osteoarthritis    Hips (L>R on x-ray 04/2013), shoulders, and knees (hx of adhesive capsulitis of shoulder)   Predi[place]    [place]    Past Surgical History:  Procedure Laterality Date   COLONOSCOPY     2008 w/polyps,  09-22-2011 w/polyps.  01/2015 tubular adenoma x 1.  05/31/18 colonoscopy w/ two tubular adenomas.   COLONOSCOPY W/ POLYPECTOMY  2008; 09/22/11;01/2015   Tubular adenoma--recall 3 yrs per Dr. Louanne Belton at [facility].  Then switched to Dr. Hilarie Fredrickson with [facility].   POLYPECTOMY     UMBILICAL HERNIA REPAIR  2011   Family History  Problem Relation Age of  Onset   Hypertension Father    Alcoholism Father    Stomach cancer Father        primary with mets to colon   Colon cancer Father    Dementia Mother    Asthma Brother    Diabetes Sister    Colon polyps Neg Hx    Esophageal cancer Neg Hx    Rectal cancer Neg Hx    Social History   Socioeconomic History   Marital status: Married    Spouse name: Not on file   Number of children: Not on file   Years of education: Not on file   Highest education level: Not on file  Occupational History   Not on file  Tobacco Use   Smoking status: Never   Smokeless tobacco: Never  Vaping Use   Vaping Use: Never used  Substance and Sexual Activity   Alcohol use: Yes    Alcohol/week: 1.0 standard drink    Types: 1 Cans of beer per week    Comment: occassional   Drug use: No   Sexual activity: Not on file  Other Topics Concern   Not on file  Social History Narrative   Married, 2 children, 3 grandchildren.   Occupation: retired Financial trader   Education: HS   No tob, minimal alcohol, no drugs.    Social Determinants of Health   Financial Resource Strain: Low Risk    Difficulty of Paying Living Expenses: Not hard at all  Food Insecurity: No Food Insecurity   Worried About Charity fundraiser in the Last Year: Never true   [place] in the Last Year: Never true  Transportation Needs: No Transportation Needs   Lack of Transportation (Medical): No   Lack of Transportation (Non-Medical): No  Physical Activity: Inactive   Days of Exercise per Week: 0 days   Minutes of Exercise per Session: 0 min  Stress: No Stress Concern Present   Feeling of Stress : Not at all  Social Connections: Socially Integrated   Frequency of Communication with Friends and Family: More than three times a week   Frequency of Social Gatherings with Friends and Family: More than three times a week   Attends Religious Services: More than 4 times per year   Active Member of [employer] or  Organizations: Yes   Attends Archivist Meetings: 1 to 4 times per year   Marital Status: Married    Tobacco Counseling Counseling given: Not Answered   Clinical Intake:  Pre-visit preparation completed: Yes  Pain : No/denies pain     BMI - recorded: 35.93 Nutritional Status: BMI > 30  Obese Nutritional Risks: None Diabetes: No  How often do you need to have someone help you when you read instructions, pamphlets, or other written materials from your doctor or pharmacy?: 1 - Never  Diabetic?No  Interpreter Needed?: No  Information entered by :: Charlott Rakes, LPN   Activities of Daily Living In your present state  of health, do you have any difficulty performing the following activities: 06/22/2021  Hearing? N  Vision? N  Difficulty concentrating or making decisions? N  Walking or climbing stairs? N  Dressing or bathing? N  Doing errands, shopping? N  Preparing Food and eating ? N  Using the Toilet? N  In the past six months, have you accidently leaked urine? N  Do you have problems with loss of bowel control? N  Managing your Medications? N  Managing your Finances? N  Housekeeping or managing your Housekeeping? N  Some recent data might be hidden    Patient Care Team: Tammi Sou, MD as PCP - General (Family Medicine) Pyrtle, Lajuan Lines, MD as Consulting Physician (Gastroenterology) [facility] Marlou Sa, Tonna Corner, MD as Consulting Physician (Orthopedic Surgery) Paulla Dolly Tamala Fothergill, DPM as Consulting Physician (Podiatry)  Indicate any recent Medical Services you may have received from other than [facility] providers in the past year (date may be approximate).     Assessment:   This is a routine wellness examination for Gregory Blevins.  Hearing/Vision screen Hearing Screening - Comments:: Pt denies any hearing issues  Vision Screening - Comments:: Pt follows up with Dr Angeline Slim for annul eye exams   Dietary issues and exercise activities  discussed: Current Exercise Habits: The patient does not participate in regular exercise at present   Goals Addressed             This Visit's Progress    Patient Stated       None at this time        Depression Screen PHQ 2/9 Scores 06/22/2021 06/09/2020 04/15/2020 04/11/2019 04/09/2018 09/18/2017 03/22/2016  PHQ - 2 Score 0 0 0 0 0 0 1    Fall Risk Fall Risk  06/22/2021 06/09/2020 04/15/2020 04/11/2019 04/09/2018  Falls in the past year? 0 1 0 0 No  Comment - - - - -  Number falls in past yr: 0 0 0 0 -  Injury with Fall? 0 0 0 0 -  Risk for fall due to : Impaired vision History of fall(s) - - -  Follow up Falls prevention discussed Falls prevention discussed Falls evaluation completed Falls prevention discussed -    FALL RISK PREVENTION PERTAINING TO THE HOME:  Any stairs in or around the home? No  If so, are there any without handrails? No  Home free of loose throw rugs in walkways, pet beds, electrical cords, etc? Yes  Adequate lighting in your home to reduce risk of falls? Yes   ASSISTIVE DEVICES UTILIZED TO PREVENT FALLS:  Life alert? No  Use of a cane, walker or w/c? No  Grab bars in the bathroom? Yes  Shower chair or bench in shower? Yes  Elevated toilet seat or a handicapped toilet? No   TIMED UP AND GO:  Was the test performed? No .   Cognitive Function: MMSE - Mini Mental State Exam 04/11/2019 04/09/2018  Orientation to time 5 5  Orientation to Place 5 5  Registration 3 3  Attention/ Calculation 5 5  Recall 2 1  Language- name 2 objects 2 2  Language- repeat 1 1  Language- follow 3 step command 3 3  Language- read & follow direction 1 1  Write a sentence 1 1  Copy design 1 1  Total score 29 28     6CIT Screen 06/22/2021 06/09/2020  What Year? 0 points 0 points  What month? 0 points 0 points  What time? 0 points 0  points  Count back from 20 0 points 0 points  Months in reverse 0 points 0 points  Repeat phrase 0 points 2 points  Total Score 0 2     Immunizations Immunization History  Administered Date(s) Administered   Fluad Quad(high Dose 65+) 04/11/2019, 04/15/2020, 04/15/2021   Influenza Split 07/14/2011, 05/13/2012   Influenza, High Dose Seasonal PF 04/16/2015, 03/22/2016, 03/21/2017, 04/09/2018   Influenza,inj,Quad PF,6+ Mos 03/21/2013, 04/10/2014   Influenza-Unspecified 05/13/1997, 05/11/2019, 04/26/2020   Moderna Sars-Covid-2 Vaccination 08/13/2019, 09/09/2019, 06/17/2020   Pneumococcal Conjugate-13 04/10/2014   Pneumococcal Polysaccharide-23 07/19/2009   Td 06/17/2009   Zoster, Live 07/05/2010    TDAP status: Due, Education has been provided regarding the importance of this vaccine. Advised may receive this vaccine at local pharmacy or Health Dept. Aware to provide a copy of the vaccination record if obtained from local pharmacy or Health Dept. Verbalized acceptance and understanding.  Flu Vaccine status: Up to date  Pneumococcal vaccine status: Up to date  Covid-19 vaccine status: Completed vaccines  Qualifies for Shingles Vaccine? Yes   Zostavax completed No   Shingrix Completed?: No.    Education has been provided regarding the importance of this vaccine. Patient has been advised to call insurance company to determine out of pocket expense if they have not yet received this vaccine. Advised may also receive vaccine at local pharmacy or Health Dept. Verbalized acceptance and understanding.  Screening Tests Health Maintenance  Topic Date Due   COVID-19 Vaccine (4 - Booster for Moderna series) 08/12/2020   Zoster Vaccines- Shingrix (1 of 2) 07/16/2021 (Originally 04/19/1960)   TETANUS/TDAP  04/15/2022 (Originally 06/18/2019)   Pneumonia Vaccine 41+ Years old  Completed   INFLUENZA VACCINE  Completed   HPV VACCINES  Aged Out    Health Maintenance  Health Maintenance Due  Topic Date Due   COVID-19 Vaccine (4 - Booster for Moderna series) 08/12/2020    Colorectal cancer screening: No longer required.    Additional Screening:   Vision Screening: Recommended annual ophthalmology exams for early detection of glaucoma and other disorders of the eye. Is the patient up to date with their annual eye exam?  Yes  Who is the provider or what is the name of the office in which the patient attends annual eye exams? Dr Angeline Slim If pt is not established with a provider, would they like to be referred to a provider to establish care? No .   Dental Screening: Recommended annual dental exams for proper oral hygiene  Community Resource Referral / Chronic Care Management: CRR required this visit?  No   CCM required this visit?  No      Plan:     I have personally reviewed and noted the following in the patients chart:   Medical and social history Use of alcohol, tobacco or illicit drugs  Current medications and supplements including opioid prescriptions. Patient is not currently taking opioid prescriptions. Functional ability and status Nutritional status Physical activity Advanced directives List of other physicians Hospitalizations, surgeries, and ER visits in previous 12 months Vitals Screenings to include cognitive, depression, and falls Referrals and appointments  In addition, I have reviewed and discussed with patient certain preventive protocols, quality metrics, and best practice recommendations. A written personalized care plan for preventive services as well as general preventive health recommendations were provided to patient.     Willette Brace, LPN   [id number]   Nurse Notes: None

## 2021-06-22 NOTE — Patient Instructions (Signed)
[name] , Thank you for taking time to come for your Medicare Wellness Visit. I appreciate your ongoing commitment to your health goals. Please review the following plan we discussed and let me know if I can assist you in the future.   Screening recommendations/referrals: Colonoscopy: No longer required  Recommended yearly ophthalmology/optometry visit for glaucoma screening and checkup Recommended yearly dental visit for hygiene and checkup  Vaccinations: Influenza vaccine: Done 04/15/21 repeat every year  Pneumococcal vaccine: Up to date Tdap vaccine: Due and discussed Shingles vaccine: Shingrix discussed. Please contact your pharmacy for coverage information.    Covid-19: Completed 2/3, 3/2, & 06/17/20  Advanced directives: Copies in chart  Conditions/risks identified: none at this time   Next appointment: Follow up in one year for your annual wellness visit.   Preventive Care 80 Years and Older, Male Preventive care refers to lifestyle choices and visits with your health care provider that can promote health and wellness. What does preventive care include? A yearly physical exam. This is also called an annual well check. Dental exams once or twice a year. Routine eye exams. Ask your health care provider how often you should have your eyes checked. Personal lifestyle choices, including: Daily care of your teeth and gums. Regular physical activity. Eating a healthy diet. Avoiding tobacco and drug use. Limiting alcohol use. Practicing safe sex. Taking low doses of aspirin every day. Taking vitamin and mineral supplements as recommended by your health care provider. What happens during an annual well check? The services and screenings done by your health care provider during your annual well check will depend on your age, overall health, lifestyle risk factors, and family history of disease. Counseling  Your health care provider may ask you questions about your: Alcohol  use. Tobacco use. Drug use. Emotional well-being. Home and relationship well-being. Sexual activity. Eating habits. History of falls. Memory and ability to understand (cognition). Work and work Statistician. Screening  You may have the following tests or measurements: Height, weight, and BMI. Blood pressure. Lipid and cholesterol levels. These may be checked every 5 years, or more frequently if you are over 80 years old. Skin check. Lung cancer screening. You may have this screening every year starting at age 80 if you have a 30-pack-year history of smoking and currently smoke or have quit within the past 15 years. Fecal occult blood test (FOBT) of the stool. You may have this test every year starting at age 80. Flexible sigmoidoscopy or colonoscopy. You may have a sigmoidoscopy every 5 years or a colonoscopy every 10 years starting at age 80. Prostate cancer screening. Recommendations will vary depending on your family history and other risks. Hepatitis C blood test. Hepatitis B blood test. Sexually transmitted disease (STD) testing. Diabetes screening. This is done by checking your blood sugar (glucose) after you have not eaten for a while (fasting). You may have this done every 1-3 years. Abdominal aortic aneurysm (AAA) screening. You may need this if you are a current or former smoker. Osteoporosis. You may be screened starting at age 80 if you are at high risk. Talk with your health care provider about your test results, treatment options, and if necessary, the need for more tests. Vaccines  Your health care provider may recommend certain vaccines, such as: Influenza vaccine. This is recommended every year. Tetanus, diphtheria, and acellular pertussis (Tdap, Td) vaccine. You may need a Td booster every 10 years. Zoster vaccine. You may need this after age 5. Pneumococcal 13-valent conjugate (PCV13)  vaccine. One dose is recommended after age 80. Pneumococcal polysaccharide  (PPSV23) vaccine. One dose is recommended after age 80. Talk to your health care provider about which screenings and vaccines you need and how often you need them. This information is not intended to replace advice given to you by your health care provider. Make sure you discuss any questions you have with your health care provider. Document Released: 07/23/2015 Document Revised: 03/15/2016 Document Reviewed: 04/27/2015 Elsevier Interactive Patient Education  2017 [employer] Prevention in the Home Falls can cause injuries. They can happen to people of all ages. There are many things you can do to make your home safe and to help prevent falls. What can I do on the outside of my home? Regularly fix the edges of walkways and driveways and fix any cracks. Remove anything that might make you trip as you walk through a door, such as a raised step or threshold. Trim any bushes or trees on the path to your home. Use bright outdoor lighting. Clear any walking paths of anything that might make someone trip, such as rocks or tools. Regularly check to see if handrails are loose or broken. Make sure that both sides of any steps have handrails. Any raised decks and porches should have guardrails on the edges. Have any leaves, snow, or ice cleared regularly. Use sand or salt on walking paths during winter. Clean up any spills in your garage right away. This includes oil or grease spills. What can I do in the bathroom? Use night lights. Install grab bars by the toilet and in the tub and shower. Do not use towel bars as grab bars. Use non-skid mats or decals in the tub or shower. If you need to sit down in the shower, use a plastic, non-slip stool. Keep the floor dry. Clean up any water that spills on the floor as soon as it happens. Remove soap buildup in the tub or shower regularly. Attach bath mats securely with double-sided non-slip rug tape. Do not have throw rugs and other things on the  floor that can make you trip. What can I do in the bedroom? Use night lights. Make sure that you have a light by your bed that is easy to reach. Do not use any sheets or blankets that are too big for your bed. They should not hang down onto the floor. Have a firm chair that has side arms. You can use this for support while you get dressed. Do not have throw rugs and other things on the floor that can make you trip. What can I do in the kitchen? Clean up any spills right away. Avoid walking on wet floors. Keep items that you use a lot in easy-to-reach places. If you need to reach something above you, use a strong step stool that has a grab bar. Keep electrical cords out of the way. Do not use floor polish or wax that makes floors slippery. If you must use wax, use non-skid floor wax. Do not have throw rugs and other things on the floor that can make you trip. What can I do with my stairs? Do not leave any items on the stairs. Make sure that there are handrails on both sides of the stairs and use them. Fix handrails that are broken or loose. Make sure that handrails are as long as the stairways. Check any carpeting to make sure that it is firmly attached to the stairs. Fix any carpet that is loose  or worn. Avoid having throw rugs at the top or bottom of the stairs. If you do have throw rugs, attach them to the floor with carpet tape. Make sure that you have a light switch at the top of the stairs and the bottom of the stairs. If you do not have them, ask someone to add them for you. What else can I do to help prevent falls? Wear shoes that: Do not have high heels. Have rubber bottoms. Are comfortable and fit you well. Are closed at the toe. Do not wear sandals. If you use a stepladder: Make sure that it is fully opened. Do not climb a closed stepladder. Make sure that both sides of the stepladder are locked into place. Ask someone to hold it for you, if possible. Clearly mark and make  sure that you can see: Any grab bars or handrails. First and last steps. Where the edge of each step is. Use tools that help you move around (mobility aids) if they are needed. These include: Canes. Walkers. Scooters. Crutches. Turn on the lights when you go into a dark area. Replace any light bulbs as soon as they burn out. Set up your furniture so you have a clear path. Avoid moving your furniture around. If any of your floors are uneven, fix them. If there are any pets around you, be aware of where they are. Review your medicines with your doctor. Some medicines can make you feel dizzy. This can increase your chance of falling. Ask your doctor what other things that you can do to help prevent falls. This information is not intended to replace advice given to you by your health care provider. Make sure you discuss any questions you have with your health care provider. Document Released: 04/22/2009 Document Revised: 12/02/2015 Document Reviewed: 07/31/2014 Elsevier Interactive Patient Education  2017 [employer].

## 2021-07-20 DIAGNOSIS — Z135 Encounter for screening for eye and ear disorders: Secondary | ICD-10-CM | POA: Diagnosis not present

## 2021-07-20 DIAGNOSIS — H2513 Age-related nuclear cataract, bilateral: Secondary | ICD-10-CM | POA: Diagnosis not present

## 2021-07-20 DIAGNOSIS — H524 Presbyopia: Secondary | ICD-10-CM | POA: Diagnosis not present

## 2021-08-15 DIAGNOSIS — D229 Melanocytic nevi, unspecified: Secondary | ICD-10-CM | POA: Diagnosis not present

## 2021-08-15 DIAGNOSIS — Z85828 Personal history of other malignant neoplasm of skin: Secondary | ICD-10-CM | POA: Diagnosis not present

## 2021-08-15 DIAGNOSIS — C44519 Basal cell carcinoma of skin of other part of trunk: Secondary | ICD-10-CM | POA: Diagnosis not present

## 2021-08-15 DIAGNOSIS — D485 Neoplasm of uncertain behavior of skin: Secondary | ICD-10-CM | POA: Diagnosis not present

## 2021-08-15 DIAGNOSIS — L821 Other seborrheic keratosis: Secondary | ICD-10-CM | POA: Diagnosis not present

## 2021-08-15 DIAGNOSIS — Z08 Encounter for follow-up examination after completed treatment for malignant neoplasm: Secondary | ICD-10-CM | POA: Diagnosis not present

## 2021-08-15 DIAGNOSIS — L578 Other skin changes due to chronic exposure to nonionizing radiation: Secondary | ICD-10-CM | POA: Diagnosis not present

## 2021-08-15 DIAGNOSIS — R229 Localized swelling, mass and lump, unspecified: Secondary | ICD-10-CM | POA: Diagnosis not present

## 2021-08-15 DIAGNOSIS — L570 Actinic keratosis: Secondary | ICD-10-CM | POA: Diagnosis not present

## 2021-08-24 ENCOUNTER — Ambulatory Visit (INDEPENDENT_AMBULATORY_CARE_PROVIDER_SITE_OTHER): Payer: Medicare Other

## 2021-08-24 ENCOUNTER — Other Ambulatory Visit: Payer: Self-pay

## 2021-08-24 ENCOUNTER — Ambulatory Visit (INDEPENDENT_AMBULATORY_CARE_PROVIDER_SITE_OTHER): Payer: Medicare Other | Admitting: Orthopedic Surgery

## 2021-08-24 DIAGNOSIS — M25512 Pain in left shoulder: Secondary | ICD-10-CM

## 2021-08-24 DIAGNOSIS — G8929 Other chronic pain: Secondary | ICD-10-CM | POA: Diagnosis not present

## 2021-08-25 ENCOUNTER — Encounter: Payer: Self-pay | Admitting: Orthopedic Surgery

## 2021-08-25 NOTE — Progress Notes (Signed)
Office Visit Note   Patient: Gregory Blevins           Date of Birth: 17-May-1941           MRN: [id number] Visit Date: 08/24/2021 Requested by: Tammi Sou, MD [id number] [place],  [place] [address] PCP: Tammi Sou, MD  Subjective: Chief Complaint  Patient presents with   Left Shoulder - Pain    HPI: Gregory Blevins is an 81 year old patient with left shoulder pain.  Patient had an injection with Dr. Ernestina Patches 05/23/2021 which gave him temporary relief for 1 month.  He reports worsening pain with some component of radicular pain with numbness and tingling in the fingers.  Denies any neck pain.  Pain does not wake him from sleep at night.  Does not report any weakness in the left arm.  He is left-hand dominant.              ROS: All systems reviewed are negative as they relate to the chief complaint within the history of present illness.  Patient denies  fevers or chills.   Assessment & Plan: Visit Diagnoses:  1. Chronic left shoulder pain     Plan: Impression is end-stage left shoulder arthritis with significant loss of glenoid bone stock.  Difficult to say if replacement will be even feasible in this patient.  He has significant shortening of the glenoid with medialization of the joint due to glenoid erosion.  Plan is thin cut CT scan so we can do preop planning with standard glenoid baseplate and he may also be someone that could require vault reconstruction system.  We will see him back after that CT scan and after we have looked at the data for feasibility.  Follow-Up Instructions: Return for after MRI.   Orders:  Orders Placed This Encounter  Procedures   XR Shoulder Left   CT SHOULDER LEFT WO CONTRAST   No orders of the defined types were placed in this encounter.     Procedures: No procedures performed   Clinical Data: No additional findings.  Objective: Vital Signs: There were no vitals taken for this visit.  Physical Exam:   Constitutional:  Patient appears well-developed HEENT:  Head: Normocephalic Eyes:EOM are normal Neck: Normal range of motion Cardiovascular: Normal rate Pulmonary/chest: Effort normal Neurologic: Patient is alert Skin: Skin is warm Psychiatric: Patient has normal mood and affect   Ortho Exam: Ortho exam demonstrates intact skin around the shoulder girdle region.  EPL FPL interosseous strength intact with palpable radial pulse.  Deltoid fires.  Passive range of motion significantly restricted to 0 degrees of external rotation, 45 of glenohumeral abduction and 40 of forward flexion.  Biceps and triceps strength intact  Specialty Comments:  No specialty comments available.  Imaging: XR Shoulder Left  Result Date: 08/25/2021 Multiple radiographic views left shoulder reviewed.  Severe end-stage glenohumeral joint arthritis is present with maintenance of the acromiohumeral distance.  Significant erosion of the glenoid and medialization of the humeral head is present.  No acute fracture.    PMFS History: Patient Active Problem List   Diagnosis Date Noted   Prostate cancer screening 10/21/2015   Preventative health care 04/10/2014   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/22/2013   Facet arthropathy, lumbar 04/22/2013   Pelvic pain in male 03/21/2013   Health maintenance examination 09/27/2012   HTN (hypertension), benign 09/27/2012   Basal cell carcinoma of skin 07/12/2011   Colon polyp 07/12/2011   High risk medication use 07/12/2011  Elevation of level of transaminase or lactic acid dehydrogenase (LDH) 08/31/2010   Onychomycosis due to dermatophyte 08/31/2010   Osteoarthrosis involving lower leg 08/31/2010   Other and unspecified hyperlipidemia 08/31/2010   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 out grown   Hearing impairment    11/2015 Audiology eval= not yet ready for hearing aids ([facility])   History of adenomatous polyp of colon    On multiple colonoscopies: most recent colonoscopy 05/31/18-->adenoma x 2, repeat 3-5 ys is optional.   History of basal cell carcinoma of skin    HTN (hypertension)    Hyperlipidemia    Lumbar spondylosis 04/2013   with grade I spondylolisthesis L4 on L5.   Microhematuria 09/2017   On dipstick only.  Urine micro 09/2017 and 04/2018-->no RBCs.   OAB (overactive bladder)    ditropan xl 10 ineffective--pt d/c'd this 09/2018   Obesity    Osteoarthritis    Hips (L>R on x-ray 04/2013), shoulders, and knees (hx of adhesive capsulitis of shoulder)   Prediabetes    A1c 6.1% 08/2019 at [facility] leakage     Family History  Problem Relation Age of Onset   Hypertension Father    Alcoholism Father    Stomach cancer Father        primary with mets to colon   Colon cancer Father    Dementia Mother    Asthma Brother    Diabetes Sister    Colon polyps Neg Hx    Esophageal cancer Neg Hx    Rectal cancer Neg Hx     Past Surgical History:  Procedure Laterality Date   COLONOSCOPY     2008 w/polyps,  09-22-2011 w/polyps.  01/2015 tubular adenoma x 1.  05/31/18 colonoscopy w/ two tubular adenomas.   COLONOSCOPY W/ POLYPECTOMY  2008; 09/22/11;01/2015   Tubular adenoma--recall 3 yrs per Dr. Louanne Belton at [facility].  Then switched to Dr. Hilarie Fredrickson with [facility].   POLYPECTOMY     UMBILICAL HERNIA REPAIR  2011   Social History   Occupational History   Not on file  Tobacco Use   Smoking status: Never   Smokeless tobacco: Never  Vaping Use   Vaping Use: Never used  Substance and Sexual Activity   Alcohol use: Yes     Alcohol/week: 1.0 standard drink    Types: 1 Cans of beer per week    Comment: occassional   Drug use: No   Sexual activity: Not on file

## 2021-08-30 DIAGNOSIS — L578 Other skin changes due to chronic exposure to nonionizing radiation: Secondary | ICD-10-CM | POA: Diagnosis not present

## 2021-08-30 DIAGNOSIS — L244 Irritant contact dermatitis due to drugs in contact with skin: Secondary | ICD-10-CM | POA: Diagnosis not present

## 2021-09-14 DIAGNOSIS — L905 Scar conditions and fibrosis of skin: Secondary | ICD-10-CM | POA: Diagnosis not present

## 2021-09-14 DIAGNOSIS — C44519 Basal cell carcinoma of skin of other part of trunk: Secondary | ICD-10-CM | POA: Diagnosis not present

## 2021-09-15 ENCOUNTER — Ambulatory Visit
Admission: RE | Admit: 2021-09-15 | Discharge: 2021-09-15 | Disposition: A | Discharge status: Home / Self Care | Payer: Medicare Other | Source: Ambulatory Visit | Attending: Orthopedic Surgery | Admitting: Orthopedic Surgery

## 2021-09-15 DIAGNOSIS — M25512 Pain in left shoulder: Secondary | ICD-10-CM

## 2021-09-19 ENCOUNTER — Ambulatory Visit (INDEPENDENT_AMBULATORY_CARE_PROVIDER_SITE_OTHER): Payer: Medicare Other | Admitting: Podiatry

## 2021-09-19 ENCOUNTER — Encounter: Payer: Self-pay | Admitting: Podiatry

## 2021-09-19 ENCOUNTER — Other Ambulatory Visit: Payer: Self-pay

## 2021-09-19 DIAGNOSIS — M79675 Pain in left toe(s): Secondary | ICD-10-CM

## 2021-09-19 DIAGNOSIS — M19012 Primary osteoarthritis, left shoulder: Secondary | ICD-10-CM | POA: Insufficient documentation

## 2021-09-19 DIAGNOSIS — B351 Tinea unguium: Secondary | ICD-10-CM

## 2021-09-19 DIAGNOSIS — M79674 Pain in right toe(s): Secondary | ICD-10-CM

## 2021-09-22 ENCOUNTER — Ambulatory Visit (INDEPENDENT_AMBULATORY_CARE_PROVIDER_SITE_OTHER): Payer: Medicare Other | Admitting: Orthopedic Surgery

## 2021-09-22 ENCOUNTER — Other Ambulatory Visit: Payer: Self-pay

## 2021-09-22 ENCOUNTER — Encounter: Payer: Self-pay | Admitting: Orthopedic Surgery

## 2021-09-22 DIAGNOSIS — M25512 Pain in left shoulder: Secondary | ICD-10-CM

## 2021-09-22 DIAGNOSIS — G8929 Other chronic pain: Secondary | ICD-10-CM

## 2021-09-22 DIAGNOSIS — M25511 Pain in right shoulder: Secondary | ICD-10-CM

## 2021-09-22 NOTE — Progress Notes (Signed)
? ?Office Visit Note ?  ?Patient: Gregory Blevins           ?Date of Birth: Mar 30, 1941           ?MRN: [id number] ?Visit Date: 09/22/2021 ?Requested by: Tammi Sou, MD ?[phone] ?[place],  [place] [address] ?PCP: Tammi Sou, MD ? ?Subjective: ?Chief Complaint  ?Patient presents with  ? Left Shoulder - Follow-up  ? ? ?HPI: With left shoulder pain.  Had CT scan.  Still is having pain and he would consider operative intervention if possible based on glenoid bone stock ?             ?ROS: All systems reviewed are negative as they relate to the chief complaint within the history of present illness.  Patient denies  fevers or chills. ? ? ?Assessment & Plan: ?Visit Diagnoses:  ?1. Chronic right shoulder pain   ?2. Chronic left shoulder pain   ? ? ?Plan: Impression is left shoulder pain with bone stock which may or may not be sufficient for standard reverse shoulder replacement baseplate.  Talked with Josh today.  We will send up the images for preop planning.  I will call him next week with the plan.  Risk and benefits of the procedure are discussed including not limited to infection nerve vessel damage dislocation as well as potential for implant loosening particularly with diminished glenoid bone stock. ? ?Follow-Up Instructions: No follow-ups on file.  ? ?Orders:  ?No orders of the defined types were placed in this encounter. ? ?No orders of the defined types were placed in this encounter. ? ? ? ? Procedures: ?No procedures performed ? ? ?Clinical Data: ?No additional findings. ? ?Objective: ?Vital Signs: There were no vitals taken for this visit. ? ?Physical Exam:  ? ?Constitutional: Patient appears well-developed ?HEENT:  ?Head: Normocephalic ?Eyes:EOM are normal ?Neck: Normal range of motion ?Cardiovascular: Normal rate ?Pulmonary/chest: Effort normal ?Neurologic: Patient is alert ?Skin: Skin is warm ?Psychiatric: Patient has normal mood and affect ? ? ?Ortho Exam: Ortho exam demonstrates  functional deltoid with range of motion of 0/70/85 ? ?Specialty Comments:  ?No specialty comments available. ? ?Imaging: ?No results found. ? ? ?PMFS History: ?Patient Active Problem List  ? Diagnosis Date Noted  ? 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 Prostate cancer screening 10/21/2015  ? Preventative health care 04/10/2014  ? 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 Facet arthropathy, lumbar 04/22/2013  ? Pelvic pain in male 03/21/2013  ? Health maintenance examination 09/27/2012  ? HTN (hypertension), benign 09/27/2012  ? Basal cell carcinoma of skin 07/12/2011  ? Colon polyp 07/12/2011  ? High risk medication use 07/12/2011  ? Elevation of level of transaminase or lactic acid dehydrogenase (LDH) 08/31/2010  ? Onychomycosis due to dermatophyte 08/31/2010  ? Osteoarthrosis involving lower leg 08/31/2010  ? Other and unspecified hyperlipidemia 08/31/2010  ? 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 Myalgia and myositis, unspecified 10/19/2009  ? 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 Morbid obesity ([place]) 06/17/2009  ? 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 ?Past Medical History:  ?Diagnosis Date  ? Chronic left shoulder pain   ? 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 Diverticulosis 2013  ? noted on colonoscopy  ? Fatty liver 05/2012  ? u/s done for mild/persistent elevation of LFTs  ? Hay fever   ? no issues now-? out grown  ? Hearing impairment   ? 11/2015 Audiology eval= not yet ready for hearing aids ([facility])  ? History of adenomatous polyp of colon   ? On multiple colonoscopies: most recent colonoscopy 05/31/18-->adenoma x 2, repeat 3-5 ys is optional.  ? History of basal cell carcinoma of skin   ? HTN (hypertension)   ? Hyperlipidemia   ? Lumbar spondylosis 04/2013  ? 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  ? Microhematuria 09/2017  ? On dipstick only.  Urine micro 09/2017 and 04/2018-->no RBCs.  ? OAB (overactive bladder)   ? ditropan xl 10 ineffective--pt d/c'd this 09/2018  ?  Obesity   ? Osteoarthritis   ? Hips (L>R on x-ray 04/2013), shoulders, and knees (hx of adhesive capsulitis of shoulder)  ? Prediabetes   ? A1c 6.1% 08/2019 at [facility]  ? Rectal leakage   ?  ?Family History  ?Problem Relation Age of Onset  ? Hypertension Father   ? Alcoholism Father   ? Stomach cancer Father   ?     primary with mets to colon  ? Colon cancer Father   ? Dementia Mother   ? Asthma Brother   ? Diabetes Sister   ? Colon polyps Neg Hx   ? Esophageal cancer Neg Hx   ? Rectal cancer Neg Hx   ?  ?Past Surgical History:  ?Procedure Laterality Date  ? COLONOSCOPY    ? 2008 w/polyps,  09-22-2011 w/polyps.  01/2015 tubular adenoma x 1.  05/31/18 colonoscopy w/ two tubular adenomas.  ? COLONOSCOPY W/ POLYPECTOMY  2008; 09/22/11;01/2015  ? Tubular adenoma--recall 3 yrs per Dr. Louanne Belton at [facility].  Then switched to Dr. Hilarie Fredrickson with [facility].  ? POLYPECTOMY    ? UMBILICAL HERNIA REPAIR  2011  ? ?Social History  ? ?Occupational History  ? Not on file  ?Tobacco Use  ? Smoking status: Never  ? Smokeless tobacco: Never  ?Vaping Use  ? Vaping Use: Never used  ?Substance and Sexual Activity  ? Alcohol use: Yes  ?  Alcohol/week: 1.0 standard drink  ?  Types: 1 Cans of beer per week  ?  Comment: occassional  ? Drug use: No  ? Sexual activity: Not on file  ? ? ? ? ? ?

## 2021-09-25 NOTE — Progress Notes (Signed)
?  Subjective:  ?Patient ID: Gregory Blevins, male    DOB: [birth year],  MRN: [id number] ? ?Gregory Blevins presents to clinic today for painful elongated mycotic toenails 1-5 bilaterally which are tender when wearing enclosed shoe gear. Pain is relieved with periodic professional debridement. ? ?New problem(s): None.  ? ?PCP is [name], [name], MD , and last visit was April 15, 2021. ? ?No Known Allergies ? ?Review of Systems: Negative except as noted in the HPI. ? ?Objective: No changes noted in today's physical examination. ?General: Patient is a pleasant 81 y.o. Caucasian male in NAD. AAO x 3.  ? ?Neurovascular Examination: ?CFT <4 seconds b/l LE. Faintly palpable DP pulses b/l LE. Nonpalpable PT pulse(s) b/l LE. Pedal hair absent. No pain with calf compression b/l. Trace edema noted BLE. ? ?Protective sensation intact 5/5 intact bilaterally with 10g monofilament b/l. ? ?Dermatological:  ?Pedal integument with normal turgor, texture and tone BLE. Toenails 1-5 b/l elongated, discolored, dystrophic, thickened, crumbly with subungual debris and tenderness to dorsal palpation. No hyperkeratotic nor porokeratotic lesions present on today's visit. ? ?Musculoskeletal:  ?Muscle strength 5/5 to all lower extremity muscle groups bilaterally. No pain, crepitus or joint limitation noted with ROM b/l lower extremities. Limited joint ROM to the 1st MPJ b/l. ? ?Hemoglobin A1C Latest Ref Rng & Units 04/15/2021 10/14/2020  ?HGBA1C 4.6 - 6.5 % 6.0 6.1  ?Some recent data might be hidden  ? ?Assessment/Plan: ?1. Pain due to onychomycosis of toenails of both feet   ?-Examined patient. ?-Toenails 1-5 b/l were debrided in length and girth with sterile nail nippers and dremel without iatrogenic bleeding.  ?-Patient/POA to call should there be question/concern in the interim.  ? ?Return in about 3 months (around 12/20/2021). ? ?Marzetta Board, DPM  ?

## 2021-09-26 ENCOUNTER — Other Ambulatory Visit: Payer: Self-pay | Admitting: Family Medicine

## 2021-10-03 ENCOUNTER — Ambulatory Visit (INDEPENDENT_AMBULATORY_CARE_PROVIDER_SITE_OTHER): Payer: Medicare Other | Admitting: Orthopedic Surgery

## 2021-10-03 ENCOUNTER — Other Ambulatory Visit: Payer: Self-pay

## 2021-10-03 DIAGNOSIS — M19012 Primary osteoarthritis, left shoulder: Secondary | ICD-10-CM | POA: Diagnosis not present

## 2021-10-05 ENCOUNTER — Encounter: Payer: Self-pay | Admitting: Orthopedic Surgery

## 2021-10-05 NOTE — Progress Notes (Signed)
? ?Office Visit Note ?  ?Patient: Gregory Blevins           ?Date of Birth: March 31, 1941           ?MRN: [id number] ?Visit Date: 10/03/2021 ?Requested by: Gregory Sou, MD ?[phone] ?[place],  [place] [address] ?PCP: Gregory Sou, MD ? ?Subjective: ?Chief Complaint  ?Patient presents with  ? Left Shoulder - Pain  ? ? ?HPI: Gregory Blevins is an 81 year old patient with severe left shoulder arthritis.  CT scan has been done and I have templated it to be able to undergo reverse shoulder replacement.  He does have a lot of glenoid wear but we are able to get him back to neutral version.  The risk and benefits of the procedure are discussed with the patient. ?             ?ROS: All systems reviewed are negative as they relate to the chief complaint within the history of present illness.  Patient denies  fevers or chills. ? ? ?Assessment & Plan: ?Visit Diagnoses:  ?1. Arthritis of left shoulder region   ? ? ?Plan: Impression is left shoulder arthritis.  Had a lengthy discussion with Gregory Blevins and his wife today about the risk and benefits of surgery including not limited to infection nerve vessel damage instability as well as the perioperative risk of stroke heart attack and other medical complications associated with the stress of surgery for patient in his age group.  Gregory Blevins is having a lot of pain in that shoulder as well as a lot of restricted motion.  I think the reverse replacement could predictably help his pain and he may get marginal improvement in his range of motion.  Patient understands the risk and benefits associated with surgical intervention.  Would like to proceed with surgery.  All questions answered. ? ?Follow-Up Instructions: No follow-ups on file.  ? ?Orders:  ?No orders of the defined types were placed in this encounter. ? ?No orders of the defined types were placed in this encounter. ? ? ? ? Procedures: ?No procedures performed ? ? ?Clinical Data: ?No additional findings. ? ?Objective: ?Vital Signs:  There were no vitals taken for this visit. ? ?Physical Exam:  ? ?Constitutional: Patient appears well-developed ?HEENT:  ?Head: Normocephalic ?Eyes:EOM are normal ?Neck: Normal range of motion ?Cardiovascular: Normal rate ?Pulmonary/chest: Effort normal ?Neurologic: Patient is alert ?Skin: Skin is warm ?Psychiatric: Patient has normal mood and affect ? ? ?Ortho Exam: Ortho exam demonstrates range of motion of that left shoulder of 0/60/90.  Rotator cuff strength difficult to assess due to limited motion.  Deltoid is functional.  EPL FPL interosseous strength intact with palpable radial pulse. ? ?Specialty Comments:  ?No specialty comments available. ? ?Imaging: ?No results found. ? ? ?PMFS History: ?Patient Active Problem List  ? Diagnosis Date Noted  ? 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 Prostate cancer screening 10/21/2015  ? Preventative health care 04/10/2014  ? 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 Facet arthropathy, lumbar 04/22/2013  ? Pelvic pain in male 03/21/2013  ? Health maintenance examination 09/27/2012  ? HTN (hypertension), benign 09/27/2012  ? Basal cell carcinoma of skin 07/12/2011  ? Colon polyp 07/12/2011  ? High risk medication use 07/12/2011  ? Elevation of level of transaminase or lactic acid dehydrogenase (LDH) 08/31/2010  ? Onychomycosis due to dermatophyte 08/31/2010  ? Osteoarthrosis involving lower leg 08/31/2010  ? Other and unspecified hyperlipidemia 08/31/2010  ? Adhesive capsulitis of shoulder  04/25/2010  ? Myalgia and myositis, unspecified 10/19/2009  ? 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 Morbid obesity ([place]) 06/17/2009  ? 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 ?Past Medical History:  ?Diagnosis Date  ? Chronic left shoulder pain   ? 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 Diverticulosis 2013  ? noted on colonoscopy  ? Fatty liver 05/2012  ? u/s done for mild/persistent elevation of LFTs  ? Hay fever   ? no issues now-? out grown  ? Hearing impairment   ? 11/2015 Audiology  eval= not yet ready for hearing aids ([facility])  ? History of adenomatous polyp of colon   ? On multiple colonoscopies: most recent colonoscopy 05/31/18-->adenoma x 2, repeat 3-5 ys is optional.  ? History of basal cell carcinoma of skin   ? HTN (hypertension)   ? Hyperlipidemia   ? Lumbar spondylosis 04/2013  ? 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  ? Microhematuria 09/2017  ? On dipstick only.  Urine micro 09/2017 and 04/2018-->no RBCs.  ? OAB (overactive bladder)   ? ditropan xl 10 ineffective--pt d/c'd this 09/2018  ? Obesity   ? Osteoarthritis   ? Hips (L>R on x-ray 04/2013), shoulders, and knees (hx of adhesive capsulitis of shoulder)  ? Prediabetes   ? A1c 6.1% 08/2019 at [facility]  ? Rectal leakage   ?  ?Family History  ?Problem Relation Age of Onset  ? Hypertension Father   ? Alcoholism Father   ? Stomach cancer Father   ?     primary with mets to colon  ? Colon cancer Father   ? Dementia Mother   ? Asthma Brother   ? Diabetes Sister   ? Colon polyps Neg Hx   ? Esophageal cancer Neg Hx   ? Rectal cancer Neg Hx   ?  ?Past Surgical History:  ?Procedure Laterality Date  ? COLONOSCOPY    ? 2008 w/polyps,  09-22-2011 w/polyps.  01/2015 tubular adenoma x 1.  05/31/18 colonoscopy w/ two tubular adenomas.  ? COLONOSCOPY W/ POLYPECTOMY  2008; 09/22/11;01/2015  ? Tubular adenoma--recall 3 yrs per Dr. Louanne Blevins at [place].  Then switched to Dr. Hilarie Blevins with [facility].  ? POLYPECTOMY    ? UMBILICAL HERNIA REPAIR  2011  ? ?Social History  ? ?Occupational History  ? Not on file  ?Tobacco Use  ? Smoking status: Never  ? Smokeless tobacco: Never  ?Vaping Use  ? Vaping Use: Never used  ?Substance and Sexual Activity  ? Alcohol use: Yes  ?  Alcohol/week: 1.0 standard drink  ?  Types: 1 Cans of beer per week  ?  Comment: occassional  ? Drug use: No  ? Sexual activity: Not on file  ? ? ? ? ? ?

## 2021-10-10 ENCOUNTER — Encounter (HOSPITAL_BASED_OUTPATIENT_CLINIC_OR_DEPARTMENT_OTHER): Payer: Self-pay | Admitting: Urology

## 2021-10-10 ENCOUNTER — Other Ambulatory Visit: Payer: Self-pay

## 2021-10-10 ENCOUNTER — Emergency Department (HOSPITAL_BASED_OUTPATIENT_CLINIC_OR_DEPARTMENT_OTHER)
Admission: EM | Admit: 2021-10-10 | Discharge: 2021-10-10 | Disposition: A | Discharge status: Home / Self Care | Payer: Medicare Other | Attending: Emergency Medicine | Admitting: Emergency Medicine

## 2021-10-10 DIAGNOSIS — Z79899 Other long term (current) drug therapy: Secondary | ICD-10-CM | POA: Diagnosis not present

## 2021-10-10 DIAGNOSIS — I10 Essential (primary) hypertension: Secondary | ICD-10-CM | POA: Diagnosis not present

## 2021-10-10 DIAGNOSIS — R110 Nausea: Secondary | ICD-10-CM | POA: Diagnosis not present

## 2021-10-10 DIAGNOSIS — H9313 Tinnitus, bilateral: Secondary | ICD-10-CM | POA: Diagnosis not present

## 2021-10-10 DIAGNOSIS — R42 Dizziness and giddiness: Secondary | ICD-10-CM

## 2021-10-10 DIAGNOSIS — H814 Vertigo of central origin: Secondary | ICD-10-CM | POA: Diagnosis not present

## 2021-10-10 MED ORDER — MECLIZINE HCL 25 MG PO TABS: 25.0000 mg | ORAL_TABLET | Freq: Three times a day (TID) | ORAL | 0 refills | Status: DC | PRN

## 2021-10-10 MED ORDER — ONDANSETRON HCL 4 MG/2ML IJ SOLN
4.0000 mg | Freq: Once | INTRAMUSCULAR | Status: AC
  Administered 2021-10-10: 4 mg via INTRAVENOUS
  Filled 2021-10-10: qty 2

## 2021-10-10 MED ORDER — DIPHENHYDRAMINE HCL 50 MG/ML IJ SOLN
50.0000 mg | Freq: Once | INTRAMUSCULAR | Status: AC
  Administered 2021-10-10: 50 mg via INTRAVENOUS
  Filled 2021-10-10: qty 1

## 2021-10-10 MED ORDER — ONDANSETRON 8 MG PO TBDP: 8.0000 mg | ORAL_TABLET | Freq: Three times a day (TID) | ORAL | 1 refills | Status: DC | PRN

## 2021-10-10 NOTE — ED Triage Notes (Signed)
Per EMS pt having dizziness and nausea that started approx 1 hr ago  ?H/o htn  ? ?Provider at bedside ?

## 2021-10-10 NOTE — ED Provider Notes (Signed)
? ?[facility] ?Provider Note: [name], MD, [place] ? ?CSN: [id number] ?MRN: [id number] ?ARRIVAL: 10/10/21 at [place] ?ROOM: MH01/MH01 ? ? ?CHIEF COMPLAINT  ?Dizziness ? ? ?HISTORY OF PRESENT ILLNESS  ?10/10/21 1:02 AM ?Gregory Blevins is a 81 y.o. male with a 1 hour history of dizziness.  By dizziness he means a sensation of the room is spinning.  Symptoms were severe earlier but have improved after EMS transported him to this facility.  Symptoms are worse with movement of his head.  He has had associated nausea but no vomiting.  He has bilateral tinnitus but this is not new.  He denies pain. ? ? ?Past Medical History:  ?Diagnosis Date  ? Chronic left shoulder pain   ? 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 Diverticulosis 2013  ? noted on colonoscopy  ? Fatty liver 05/2012  ? u/s done for mild/persistent elevation of LFTs  ? Hay fever   ? no issues now-? out grown  ? Hearing impairment   ? 11/2015 Audiology eval= not yet ready for hearing aids ([facility])  ? History of adenomatous polyp of colon   ? On multiple colonoscopies: most recent colonoscopy 05/31/18-->adenoma x 2, repeat 3-5 ys is optional.  ? History of basal cell carcinoma of skin   ? HTN (hypertension)   ? Hyperlipidemia   ? Lumbar spondylosis 04/2013  ? 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  ? Microhematuria 09/2017  ? On dipstick only.  Urine micro 09/2017 and 04/2018-->no RBCs.  ? OAB (overactive bladder)   ? ditropan xl 10 ineffective--pt d/c'd this 09/2018  ? Obesity   ? Osteoarthritis   ? Hips (L>R on x-ray 04/2013), shoulders, and knees (hx of adhesive capsulitis of shoulder)  ? Prediabetes   ? A1c 6.1% 08/2019 at [facility]  ? Rectal leakage   ? ? ?Past Surgical History:  ?Procedure Laterality Date  ? COLONOSCOPY    ? 2008 w/polyps,  09-22-2011 w/polyps.  01/2015 tubular adenoma x 1.  05/31/18 colonoscopy w/ two tubular adenomas.  ? COLONOSCOPY W/ POLYPECTOMY  2008; 09/22/11;01/2015  ? Tubular adenoma--recall 3  yrs per Dr. Louanne Belton at [place].  Then switched to Dr. Hilarie Fredrickson with [facility].  ? POLYPECTOMY    ? UMBILICAL HERNIA REPAIR  2011  ? ? ?Family History  ?Problem Relation Age of Onset  ? Hypertension Father   ? Alcoholism Father   ? Stomach cancer Father   ?     primary with mets to colon  ? Colon cancer Father   ? Dementia Mother   ? Asthma Brother   ? Diabetes Sister   ? Colon polyps Neg Hx   ? Esophageal cancer Neg Hx   ? Rectal cancer Neg Hx   ? ? ?Social History  ? ?Tobacco Use  ? Smoking status: Never  ? Smokeless tobacco: Never  ?Vaping Use  ? Vaping Use: Never used  ?Substance Use Topics  ? Alcohol use: Yes  ?  Alcohol/week: 1.0 standard drink  ?  Types: 1 Cans of beer per week  ?  Comment: occassional  ? Drug use: No  ? ? ?Prior to Admission medications   ?Medication Sig Start Date End Date Taking? Authorizing Provider  ?meclizine (ANTIVERT) 25 MG tablet Take 1-2 tablets (25-50 mg total) by mouth 3 (three) times daily as needed for dizziness. 10/10/21  Yes Kaelen Caughlin, MD  ?ondansetron (ZOFRAN-ODT) 8 MG disintegrating tablet Take 1 tablet (8 mg  total) by mouth every 8 (eight) hours as needed for nausea or vomiting. 10/10/21  Yes Shamera Yarberry, MD  ?acetaminophen (TYLENOL) 500 MG tablet Take 500-1,000 mg by mouth in the morning and at bedtime.    [provider]  ?atorvastatin (LIPITOR) 20 MG tablet TAKE 1 TABLET BY MOUTH EVERY DAY 09/26/21   [name], [name], MD  ?cholecalciferol (VITAMIN D) 25 MCG (1000 UNIT) tablet Take 1,000 Units by mouth daily.    [provider]  ?furosemide (LASIX) 20 MG tablet 1 tab po every other day ?Patient taking differently: Take 20 mg by mouth every other day. 04/15/21   [name], [name], MD  ?lisinopril (ZESTRIL) 30 MG tablet TAKE 1 TABLET BY MOUTH EVERY DAY 05/30/21   [name], [name], MD  ? ? ?Allergies ?Patient has no known allergies. ? ? ?REVIEW OF SYSTEMS  ?Negative except as noted here or in the History of Present Illness. ? ? ?PHYSICAL EXAMINATION   ?Initial Vital Signs ?Blood pressure (!) 145/83, pulse 66, temperature 97.7 ?F (36.5 ?C), temperature source Oral, resp. rate 18, height 5' 7"  (1.702 m), weight 102.1 kg, SpO2 97 %. ? ?Examination ?General: Well-developed, well-nourished male in no acute distress; appearance consistent with age of record ?HENT: normocephalic; atraumatic ?Eyes: Nystagmus, fast component to the left ?Neck: supple ?Heart: regular rate and rhythm ?Lungs: clear to auscultation bilaterally ?Abdomen: soft; nondistended; nontender; bowel sounds present ?Extremities: No deformity; full range of motion; +1 edema of lower legs ?Neurologic: Awake, alert and oriented; motor function intact in all extremities and symmetric; no facial droop ?Skin: Warm and dry ?Psychiatric: Flat affect ? ? ?RESULTS  ?Summary of this visit's results, reviewed and interpreted by myself: ? ? EKG Interpretation ? ?Date/Time:    ?Ventricular Rate:    ?PR Interval:    ?QRS Duration:   ?QT Interval:    ?QTC Calculation:   ?R Axis:     ?Text Interpretation:   ?  ? ?  ? ?Laboratory Studies: ?No results found for this or any previous visit (from the past 24 hour(s)). ?Imaging Studies: ?No results found. ? ?ED COURSE and MDM  ?Nursing notes, initial and subsequent vitals signs, including pulse oximetry, reviewed and interpreted by myself. ? ?Vitals:  ? 10/10/21 0059 10/10/21 0100 10/10/21 0115 10/10/21 0200  ?BP: (!) 145/83 136/71 124/65 (!) 127/45  ?Pulse: 66 66  70  ?Resp: 18   16  ?Temp: 97.7 ?F (36.5 ?C)     ?TempSrc: Oral     ?SpO2: 97% 97%  98%  ?Weight:      ?Height:      ? ?Medications  ?diphenhydrAMINE (BENADRYL) injection 50 mg (50 mg Intravenous Given 10/10/21 0112)  ?ondansetron [facility]) injection 4 mg (4 mg Intravenous Given 10/10/21 0112)  ? ?1:45 AM ?Patient nearly asymptomatic at rest after IV medications.  Slight nystagmus remains. ? ?2:19 AM ?Patient continued to be nearly asymptomatic.  He is able to move his head without exacerbation of symptoms.  He is  comfortable lying in bed.  Vertigo attacks often require treatment with both medication and bed rest.  We will prescribe meclizine and have him follow-up with his PCP.  Presentation is consistent with peripheral vertigo, no concerns for central vertigo at this time. ? ? ?PROCEDURES  ?Procedures ? ? ?ED DIAGNOSES  ? ?  ICD-10-CM   ?1. Vertigo  R42   ?  ? ? ? ?  ?Yoshio Seliga, MD ?10/10/21 0221 ? ?

## 2021-10-10 NOTE — ED Notes (Signed)
ED Provider at bedside. 

## 2021-10-10 NOTE — ED Notes (Signed)
Patient still feeling mildly dizzy but states his symptoms are overall improved.  No change with the mild dizziness when changing positions from lying, to sitting, to standing.  Dr. Florina Ou notified verbally.   ?

## 2021-10-10 NOTE — ED Notes (Signed)
Patient discharged to home.  All discharge instructions reviewed.  Patient verbalized understanding via teachback method.  VS WDL.  Respirations even and unlabored.  Ambulatory out of ED.  Taxi voucher provided. ?

## 2021-10-10 NOTE — Pre-Procedure Instructions (Signed)
Surgical Instructions ? ? ? Your procedure is scheduled on Monday, April 10. ? Report to [facility] Main Entrance "A" at 9:30 A.M., then check in with the Admitting office. ? Call this number if you have problems the morning of surgery: ? [phone] ? ? If you have any questions prior to your surgery date call [phone]: Open Monday-Friday 8am-4pm ? ? ? Remember: ? Do not eat after midnight the night before your surgery ? ?You may drink clear liquids until 8:30AM the morning of your surgery.   ?Clear liquids allowed are: Water, Non-Citrus Juices (without pulp), Carbonated Beverages, Clear Tea, Black Coffee ONLY (NO MILK, CREAM OR POWDERED CREAMER of any kind), and Gatorade ? ?Patient Instructions ? ?The night before surgery:  ?No food after midnight. ONLY clear liquids after midnight ? ? ?The day of surgery (if you have diabetes): ?Drink ONE (1) 12 oz G2 given to you in your pre admission testing appointment by 8:30AM the morning of surgery. Drink in one sitting. Do not sip.  ?This drink was given to you during your hospital  ?pre-op appointment visit.  ?Nothing else to drink after completing the  ?12 oz bottle of G2. ? ?       If you have questions, please contact your surgeon?s office. ? ?  ? Take these medicines the morning of surgery with A SIP OF WATER:  ? ?acetaminophen (TYLENOL)  ?atorvastatin (LIPITOR)  ?meclizine (ANTIVERT)  if needed ?ondansetron (ZOFRAN-ODT) if needed ? ? ?As of today, STOP taking any Aspirin (unless otherwise instructed by your surgeon) Aleve, Naproxen, Ibuprofen, Motrin, Advil, Goody's, BC's, all herbal medications, fish oil, and all vitamins. ? ?         ?Do not wear jewelry or makeup ?Do not wear lotions, powders, perfumes/colognes, or deodorant. ?Do not shave 48 hours prior to surgery.  Men may shave face and neck. ?Do not bring valuables to the hospital. ?Do not wear nail polish, gel polish, artificial nails, or any other type of covering on natural nails (fingers and toes) ?If  you have artificial nails or gel coating that need to be removed by a nail salon, please have this removed prior to surgery. Artificial nails or gel coating may interfere with anesthesia's ability to adequately monitor your vital signs. ? ?[facility] is not responsible for any belongings or valuables. .  ? ?Do NOT Smoke (Tobacco/Vaping)  24 hours prior to your procedure ? ?If you use a CPAP at night, you may bring your mask for your overnight stay. ?  ?Contacts, glasses, hearing aids, dentures or partials may not be worn into surgery, please bring cases for these belongings ?  ?For patients admitted to the hospital, discharge time will be determined by your treatment team. ?  ?Patients discharged the day of surgery will not be allowed to drive home, and someone needs to stay with them for 24 hours. ? ? ?SURGICAL WAITING ROOM VISITATION ?Patients having surgery or a procedure in a hospital may have two support people. ?Children under the age of 75 must have an adult with them who is not the patient. ?They may stay in the waiting area during the procedure and may switch out with other visitors. If the patient needs to stay at the hospital during part of their recovery, the visitor guidelines for inpatient rooms apply. ? ?Please refer to the [place] website for the visitor guidelines for Inpatients (after your surgery is over and you are in a regular room).  ? ? ? ? ? ?  Special instructions:   ? ? ?Oral Hygiene is also important to reduce your risk of infection.  Remember - BRUSH YOUR TEETH THE MORNING OF SURGERY WITH YOUR REGULAR TOOTHPASTE ? ?[facility]- Preparing for Total Shoulder Arthroplasty ? ?Before surgery, you can play an important role. Because skin is not sterile, your skin needs to be as free of germs as possible. You can reduce the number of germs on your skin by using the following products. ? ?? Benzoyl Peroxide Gel ? ?o Reduces the number of germs present on the skin ? ?o Applied twice a day to  shoulder area starting two days before surgery ? ?? Chlorhexidine Gluconate (CHG) Soap (instructions listed above on how to wash with CHG Soap) ? ?o An antiseptic cleaner that kills germs and bonds with the skin to continue killing germs even after washing ? ?o Used for showering the night before surgery and morning of surgery ? ? ?================================================================== ? ?Please follow these instructions carefully: ? ?BENZOYL PEROXIDE 5% GEL ? ?Please do not use if you have an allergy to benzoyl peroxide. If your skin becomes reddened/irritated stop using the benzoyl peroxide. ? ?Starting two days before surgery, apply as follows: ? ?1. Apply benzoyl peroxide in the morning and at night. Apply after taking a shower. If you are not taking a shower clean entire shoulder front, back, and side along with the armpit with a clean wet washcloth. ? ?2. Place a quarter-sized dollop on your SHOULDER and rub in thoroughly, making sure to cover the front, back, and side of your shoulder, along with the armpit. ? ? 2 Days prior to Surgery ?First Dose on __10/8/23__ Morning ?Second Dose on ___10/8/23___ Night ? ?Day Before Surgery ?First Dose on ___10/9/23___ Morning ?Night before surgery wash (entire body except face and private areas) with CHG Soap THEN ?Second Dose on ___10/9/23___ Night  ? ?Morning of Surgery  ?wash BODY AGAIN with CHG Soap ? ? ?4. Do NOT apply benzoyl peroxide gel on the day of surgery ? ? ?[facility]- Preparing For Surgery ? ?Before surgery, you can play an important role. Because skin is not sterile, your skin needs to be as free of germs as possible. You can reduce the number of germs on your skin by washing with CHG (chlorahexidine gluconate) Soap before surgery.  CHG is an antiseptic cleaner which kills germs and bonds with the skin to continue killing germs even after washing.   ? ? ?Please do not use if you have an allergy to CHG or antibacterial soaps. If your skin  becomes reddened/irritated stop using the CHG.  ?Do not shave (including legs and underarms) for at least 48 hours prior to first CHG shower. It is OK to shave your face. ? ?Please follow these instructions carefully. ?  ? ? Shower the NIGHT BEFORE SURGERY and the MORNING OF SURGERY with CHG Soap.  ? If you chose to wash your hair, wash your hair first as usual with your normal shampoo. After you shampoo, rinse your hair and body thoroughly to remove the shampoo.  Then [employer] and genitals (private parts) with your normal soap and rinse thoroughly to remove soap. ? ?After that Use CHG Soap as you would any other liquid soap. You can apply CHG directly to the skin and wash gently with a scrungie or a clean washcloth.  ? ?Apply the CHG Soap to your body ONLY FROM THE NECK DOWN.  Do not use on open wounds or open sores. Avoid contact  with your eyes, ears, mouth and genitals (private parts). Wash Face and genitals (private parts)  with your normal soap.  ? ?Wash thoroughly, paying special attention to the area where your surgery will be performed. ? ?Thoroughly rinse your body with warm water from the neck down. ? ?DO NOT shower/wash with your normal soap after using and rinsing off the CHG Soap. ? ?Pat yourself dry with a CLEAN TOWEL. ? ?8. Apply the Benzoyl Peroxide only the night before surgery.  Do Not use it the morning of surgery. ? ?Wear CLEAN PAJAMAS to bed the night before surgery ? ?Place CLEAN SHEETS on your bed the night before your surgery ? ?DO NOT SLEEP WITH PETS. ? ? ?Day of Surgery: ?Take a shower with CHG soap. ?Wear Clean/Comfortable clothing the morning of surgery ?Do not apply any deodorants/lotions.   ?Remember to brush your teeth WITH YOUR REGULAR TOOTHPASTE. ?  ?Please read over the following fact sheets that you were given.  ? ? ?  ? ? ? ? ?If you received a COVID test during your pre-op visit  it is requested that you wear a mask when out in public, stay away from anyone that may not be  feeling well and notify your surgeon if you develop symptoms. If you have been in contact with anyone that has tested positive in the last 10 days please notify you surgeon. ? ?  ?Please read over the following f

## 2021-10-11 ENCOUNTER — Encounter (HOSPITAL_COMMUNITY)
Admission: RE | Admit: 2021-10-11 | Discharge: 2021-10-11 | Disposition: A | Discharge status: Home / Self Care | Payer: Medicare Other | Source: Ambulatory Visit | Attending: Orthopedic Surgery | Admitting: Orthopedic Surgery

## 2021-10-11 ENCOUNTER — Encounter (HOSPITAL_COMMUNITY): Payer: Self-pay

## 2021-10-11 ENCOUNTER — Other Ambulatory Visit: Payer: Self-pay

## 2021-10-11 VITALS — BP 149/74 | HR 85 | Temp 98.4°F | Resp 17 | Ht 67.0 in | Wt 227.4 lb

## 2021-10-11 DIAGNOSIS — Z01818 Encounter for other preprocedural examination: Secondary | ICD-10-CM | POA: Insufficient documentation

## 2021-10-11 LAB — SURGICAL PCR SCREEN
MRSA, PCR: NEGATIVE
Staphylococcus aureus: NEGATIVE

## 2021-10-11 LAB — URINALYSIS, ROUTINE W REFLEX MICROSCOPIC
Bacteria, UA: NONE SEEN
Bilirubin Urine: NEGATIVE
Glucose, UA: NEGATIVE mg/dL
Hgb urine dipstick: NEGATIVE
Ketones, ur: NEGATIVE mg/dL
Leukocytes,Ua: NEGATIVE
Nitrite: NEGATIVE
Protein, ur: NEGATIVE mg/dL
Specific Gravity, Urine: 1.01 (ref 1.005–1.030)
pH: 5 (ref 5.0–8.0)

## 2021-10-11 LAB — CBC
HCT: 47.8 % (ref 39.0–52.0)
Hemoglobin: 15.6 g/dL (ref 13.0–17.0)
MCH: 30.8 pg (ref 26.0–34.0)
MCHC: 32.6 g/dL (ref 30.0–36.0)
MCV: 94.3 fL (ref 80.0–100.0)
Platelets: 171 10*3/uL (ref 150–400)
RBC: 5.07 MIL/uL (ref 4.22–5.81)
RDW: 12.3 % (ref 11.5–15.5)
WBC: 11.2 10*3/uL — ABNORMAL HIGH (ref 4.0–10.5)
nRBC: 0 % (ref 0.0–0.2)

## 2021-10-11 NOTE — Progress Notes (Signed)
?   10/11/21 1129  ?OBSTRUCTIVE SLEEP APNEA  ?Have you ever been diagnosed with sleep apnea through a sleep study? No  ?Do you snore loudly (loud enough to be heard through closed doors)?  0  ?Do you often feel tired, fatigued, or sleepy during the daytime (such as falling asleep during driving or talking to someone)? 0  ?Has anyone observed you stop breathing during your sleep? 0  ?Do you have, or are you being treated for high blood pressure? 1  ?BMI more than 35 kg/m2? 1  ?Age > 38 (1-yes) 1  ?Neck circumference greater than:Male 16 inches or larger, Male 17inches or larger? 1  ?Male Gender (Yes=1) 1  ?Obstructive Sleep Apnea Score 5  ?Score 5 or greater  Results sent to PCP  ? ? ?

## 2021-10-11 NOTE — Progress Notes (Signed)
PCP - Dr. Shawnie Dapper MD ? ?Chest x-ray - Not indicated ?EKG - 10/11/21 ?Stress Test - "Many years ago" no f/u needed ?ECHO - Denies ?Cardiac Cath - Denies ? ?Sleep Study - Denies ? ?DM - Denies ? ?ERAS Protcol -Yes  ?PRE-SURGERY Ensure   ? ?Anesthesia review: Yes recent hospital visit for vertigo ? ?Patient denies shortness of breath, fever, cough and chest pain at PAT appointment ? ? ?All instructions explained to the patient, with a verbal understanding of the material. Patient agrees to go over the instructions while at home for a better understanding. The opportunity to ask questions was provided. ? ? ?

## 2021-10-13 ENCOUNTER — Encounter: Payer: Self-pay | Admitting: Family Medicine

## 2021-10-13 ENCOUNTER — Ambulatory Visit (INDEPENDENT_AMBULATORY_CARE_PROVIDER_SITE_OTHER): Payer: Medicare Other | Admitting: Family Medicine

## 2021-10-13 VITALS — BP 92/56 | HR 74 | Temp 98.8°F | Ht 67.0 in | Wt 227.8 lb

## 2021-10-13 DIAGNOSIS — H81399 Other peripheral vertigo, unspecified ear: Secondary | ICD-10-CM | POA: Diagnosis not present

## 2021-10-13 DIAGNOSIS — R600 Localized edema: Secondary | ICD-10-CM

## 2021-10-13 DIAGNOSIS — I10 Essential (primary) hypertension: Secondary | ICD-10-CM | POA: Diagnosis not present

## 2021-10-13 DIAGNOSIS — R7303 Prediabetes: Secondary | ICD-10-CM

## 2021-10-13 DIAGNOSIS — Z01818 Encounter for other preprocedural examination: Secondary | ICD-10-CM

## 2021-10-13 DIAGNOSIS — E785 Hyperlipidemia, unspecified: Secondary | ICD-10-CM | POA: Diagnosis not present

## 2021-10-13 LAB — COMPREHENSIVE METABOLIC PANEL
ALT: 36 U/L (ref 0–53)
AST: 24 U/L (ref 0–37)
Albumin: 4.4 g/dL (ref 3.5–5.2)
Alkaline Phosphatase: 75 U/L (ref 39–117)
BUN: 37 mg/dL — ABNORMAL HIGH (ref 6–23)
CO2: 27 mEq/L (ref 19–32)
Calcium: 9.5 mg/dL (ref 8.4–10.5)
Chloride: 106 mEq/L (ref 96–112)
Creatinine, Ser: 1.33 mg/dL (ref 0.40–1.50)
GFR: 50.49 mL/min — ABNORMAL LOW (ref >60.00)
Glucose, Bld: 76 mg/dL (ref 70–99)
Potassium: 5 mEq/L (ref 3.5–5.1)
Sodium: 140 mEq/L (ref 135–145)
Total Bilirubin: 0.5 mg/dL (ref 0.2–1.2)
Total Protein: 6.9 g/dL (ref 6.0–8.3)

## 2021-10-13 LAB — HEMOGLOBIN A1C: Hgb A1c MFr Bld: 5.9 % (ref 4.6–6.5)

## 2021-10-13 MED ORDER — LISINOPRIL 30 MG PO TABS: 30.0000 mg | ORAL_TABLET | Freq: Every day | ORAL | 1 refills | Status: DC

## 2021-10-13 MED ORDER — ATORVASTATIN CALCIUM 20 MG PO TABS: 20.0000 mg | ORAL_TABLET | Freq: Every day | ORAL | 1 refills | Status: DC

## 2021-10-13 NOTE — Progress Notes (Signed)
OFFICE VISIT ? ?10/13/2021 ? ?CC:  ?Chief Complaint  ?Patient presents with  ? Hypertension  ? Hyperlipidemia  ?  Pt is not fasting  ? ?Patient is a 81 y.o. male who presents for 6 mo f/u HTN, HLD, bilat LE edema, and prediabetes. ?Also pre-surgical medical clearance for upcoming left reverse total shoulder arthroplasty scheduled for 10/17/21 with Dr. Marlou Sa. ? ?INTERIM HX: ?All labs were stable last visit.  No changes made. ?He is currently feeling well. ? ?He had an episode of vertigo on 10/10/2021 and called 911.  He felt nauseous with it. ?No vomiting.  He had not been ill or having any headaches.  He had been napping in the evening and upon waking up he felt the sensation of spinning.  Says it lasted at varying intensities for the next 15 to 30 minutes. ?While in the ER his symptoms were gone. ?No recurrence since that time.  He has chronic hearing loss and ringing in both ears. ?EKG was done 10/11/2021 the emergency department, independently reviewed by me today and it is normal.  CBC in the emergency department normal. ? ?He feels ready for his shoulder surgery coming up for 1023. ? ?Past Medical History:  ?Diagnosis Date  ? Chronic left shoulder pain   ? 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 Diverticulosis 2013  ? noted on colonoscopy  ? Fatty liver 05/2012  ? u/s done for mild/persistent elevation of LFTs  ? Hay fever   ? no issues now-? out grown  ? Hearing impairment   ? 11/2015 Audiology eval= not yet ready for hearing aids ([facility])  ? History of adenomatous polyp of colon   ? On multiple colonoscopies: most recent colonoscopy 05/31/18-->adenoma x 2, repeat 3-5 ys is optional.  ? History of basal cell carcinoma of skin   ? HTN (hypertension)   ? Hyperlipidemia   ? Lumbar spondylosis 04/2013  ? 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  ? Microhematuria 09/2017  ? On dipstick only.  Urine micro 09/2017 and 04/2018-->no RBCs.  ? OAB (overactive bladder)   ? ditropan xl 10 ineffective--pt  d/c'd this 09/2018  ? Obesity   ? Osteoarthritis   ? Hips (L>R on x-ray 04/2013), shoulders, and knees (hx of adhesive capsulitis of shoulder)  ? Prediabetes   ? A1c 6.1% 08/2019 at [facility]  ? Rectal leakage   ? ? ?Past Surgical History:  ?Procedure Laterality Date  ? COLONOSCOPY    ? 2008 w/polyps,  09-22-2011 w/polyps.  01/2015 tubular adenoma x 1.  05/31/18 colonoscopy w/ two tubular adenomas.  ? COLONOSCOPY W/ POLYPECTOMY  2008; 09/22/11;01/2015  ? Tubular adenoma--recall 3 yrs per Dr. Louanne Belton at [place].  Then switched to Dr. Hilarie Fredrickson with [facility].  ? POLYPECTOMY    ? TONSILLECTOMY    ? UMBILICAL HERNIA REPAIR  2011  ? WISDOM TOOTH EXTRACTION Bilateral   ? ? ?Outpatient Medications Prior to Visit  ?Medication Sig Dispense Refill  ? acetaminophen (TYLENOL) 500 MG tablet Take 500-1,000 mg by mouth in the morning and at bedtime.    ? cholecalciferol (VITAMIN D) 25 MCG (1000 UNIT) tablet Take 1,000 Units by mouth daily.    ? furosemide (LASIX) 20 MG tablet 1 tab po every other day (Patient taking differently: Take 20 mg by mouth every other day.) 45 tablet 3  ? meclizine (ANTIVERT) 25 MG tablet Take 1-2 tablets (25-50 mg total) by mouth 3 (three) times daily as needed for  dizziness. 30 tablet 0  ? ondansetron (ZOFRAN-ODT) 8 MG disintegrating tablet Take 1 tablet (8 mg total) by mouth every 8 (eight) hours as needed for nausea or vomiting. 10 tablet 1  ? atorvastatin (LIPITOR) 20 MG tablet TAKE 1 TABLET BY MOUTH EVERY DAY 30 tablet 0  ? lisinopril (ZESTRIL) 30 MG tablet TAKE 1 TABLET BY MOUTH EVERY DAY 90 tablet 1  ? ?No facility-administered medications prior to visit.  ? ? ?No Known Allergies ? ?ROS ?As per HPI ? ?PE: ? ?  10/13/2021  ?  8:58 AM 10/11/2021  ? 10:44 AM 10/10/2021  ?  2:45 AM  ?Vitals with BMI  ?Height 5' 7"  5' 7"    ?Weight 227 lbs 13 oz 227 lbs 6 oz   ?BMI 35.67 35.61   ?Systolic 92 211 941  ?Diastolic 56 74 49  ?Pulse 74 85 63  ? ? ? ?Physical Exam ? ?Gen: Alert, well appearing.  Patient is  oriented to person, place, time, and situation. ?AFFECT: pleasant, lucid thought and speech. ?ENT: Ears: EACs clear, normal epithelium.  TMs with good light reflex and landmarks bilaterally.  Eyes: no injection, icteris, swelling, or exudate.  EOMI, PERRLA.  No nystagmus. ?Nose: no drainage or turbinate edema/swelling.  No injection or focal lesion.  Mouth: lips without lesion/swelling.  Oral mucosa pink and moist.  Dentition intact and without obvious caries or gingival swelling.  Oropharynx without erythema, exudate, or swelling.  ?CV: RRR, no m/r/g.   ?LUNGS: CTA bilat, nonlabored resps, good aeration in all lung fields. ?ABD: soft, NT/ND, BS normal, no HSM or bruit or mass. ?EXT: no clubbing or cyanosis.  2+ bilat LL pitting edema.  ?Neuro: CN 2-12 intact bilaterally, strength 5/5 in proximal and distal upper extremities and lower extremities bilaterally.  No sensory deficits.  No tremor.  No ataxia.   ? ? ?LABS:  ?Last CBC ?Lab Results  ?Component Value Date  ? WBC 11.2 (H) 10/11/2021  ? HGB 15.6 10/11/2021  ? HCT 47.8 10/11/2021  ? MCV 94.3 10/11/2021  ? MCH 30.8 10/11/2021  ? RDW 12.3 10/11/2021  ? PLT 171 10/11/2021  ? ?Last metabolic panel ?Lab Results  ?Component Value Date  ? GLUCOSE 84 04/15/2021  ? NA 139 04/15/2021  ? K 5.1 04/15/2021  ? CL 103 04/15/2021  ? CO2 27 04/15/2021  ? BUN 38 (H) 04/15/2021  ? CREATININE 1.25 04/15/2021  ? GFRNONAA >60 07/09/2017  ? CALCIUM 9.4 04/15/2021  ? PROT 7.3 04/15/2021  ? ALBUMIN 4.4 04/15/2021  ? BILITOT 0.6 04/15/2021  ? ALKPHOS 80 04/15/2021  ? AST 24 04/15/2021  ? ALT 41 04/15/2021  ? ANIONGAP 8 07/09/2017  ? ?Last lipids ?Lab Results  ?Component Value Date  ? CHOL 125 04/15/2021  ? HDL 43.60 04/15/2021  ? [place] 57 04/15/2021  ? LDLDIRECT 76.0 03/22/2016  ? TRIG 121.0 04/15/2021  ? CHOLHDL 3 04/15/2021  ? ?Last hemoglobin A1c ?Lab Results  ?Component Value Date  ? HGBA1C 6.0 04/15/2021  ? ?Last thyroid functions ?Lab Results  ?Component Value Date  ? TSH 1.23  08/13/2019  ? ?IMPRESSION AND PLAN: ? ?#1 peripheral vertigo.  Suspect positional. ?Discussed diagnosis today.  He has been symptom-free since the episode 3 days ago. ?He has meclizine to take as needed. ? ?#2 hypertension.  Blood pressure was a little elevated in the ER few days ago.  It is a little on the low side today.  He is asymptomatic. ?Continue lisinopril 30 mg a day. ?Electrolytes  and creatinine today. ? ?3.  Hyperlipidemia, atorvastatin 20 mg/day. ?LDL was 57 about 6 months ago.  He is not fasting today.  Plan recheck in 6 months. ? ?#4 prediabetes. ?Hemoglobin A1c today. ? 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 ?This is stable on Lasix 20 mg every other day.  Continue efforts at low-sodium diet and periodic elevation of legs. ? ?#6 preoperative clearance for left shoulder. ?He is cleared, low risk. ? ?#7 Preventative health care:  ?Vaccines:  All UTD. ?Colon ca screening: rpt colonoscopy in the next couple years "optional" per GI. ?Prostate ca screening: PSA consistently normal, most recent check was done at age 28-->recommend no further screening d/t age ? ?An After Visit Summary was printed and given to the patient. ? ?FOLLOW UP: Return in about 6 months (around 04/14/2022) for routine chronic illness f/u. ? ?Signed:  Crissie Sickles, MD           10/13/2021 ? ?

## 2021-10-17 ENCOUNTER — Encounter (HOSPITAL_COMMUNITY)
Admission: RE | Disposition: A | Discharge status: Home Health | Payer: Self-pay | Source: Home / Self Care | Attending: Orthopedic Surgery

## 2021-10-17 ENCOUNTER — Other Ambulatory Visit: Payer: Self-pay

## 2021-10-17 ENCOUNTER — Observation Stay (HOSPITAL_COMMUNITY)
Admission: RE | Admit: 2021-10-17 | Discharge: 2021-10-18 | Disposition: A | Discharge status: Home Health | Payer: Medicare Other | Attending: Orthopedic Surgery | Admitting: Orthopedic Surgery

## 2021-10-17 ENCOUNTER — Ambulatory Visit (HOSPITAL_COMMUNITY): Payer: Medicare Other | Admitting: Anesthesiology

## 2021-10-17 ENCOUNTER — Encounter (HOSPITAL_COMMUNITY): Payer: Self-pay | Admitting: Orthopedic Surgery

## 2021-10-17 ENCOUNTER — Ambulatory Visit (HOSPITAL_BASED_OUTPATIENT_CLINIC_OR_DEPARTMENT_OTHER): Payer: Medicare Other | Admitting: Anesthesiology

## 2021-10-17 ENCOUNTER — Observation Stay (HOSPITAL_COMMUNITY): Payer: Medicare Other

## 2021-10-17 DIAGNOSIS — Z96612 Presence of left artificial shoulder joint: Secondary | ICD-10-CM

## 2021-10-17 DIAGNOSIS — I10 Essential (primary) hypertension: Secondary | ICD-10-CM | POA: Insufficient documentation

## 2021-10-17 DIAGNOSIS — Z85828 Personal history of other malignant neoplasm of skin: Secondary | ICD-10-CM | POA: Diagnosis not present

## 2021-10-17 DIAGNOSIS — Z96642 Presence of left artificial hip joint: Secondary | ICD-10-CM | POA: Diagnosis not present

## 2021-10-17 DIAGNOSIS — M19012 Primary osteoarthritis, left shoulder: Secondary | ICD-10-CM

## 2021-10-17 DIAGNOSIS — Z471 Aftercare following joint replacement surgery: Secondary | ICD-10-CM | POA: Diagnosis not present

## 2021-10-17 DIAGNOSIS — Z79899 Other long term (current) drug therapy: Secondary | ICD-10-CM | POA: Insufficient documentation

## 2021-10-17 DIAGNOSIS — G8918 Other acute postprocedural pain: Secondary | ICD-10-CM | POA: Diagnosis not present

## 2021-10-17 DIAGNOSIS — Z01818 Encounter for other preprocedural examination: Secondary | ICD-10-CM

## 2021-10-17 HISTORY — PX: REVERSE SHOULDER ARTHROPLASTY: SHX5054

## 2021-10-17 SURGERY — ARTHROPLASTY, SHOULDER, TOTAL, REVERSE
Anesthesia: General | Site: Shoulder | Laterality: Left

## 2021-10-17 MED ORDER — PHENOL 1.4 % MT LIQD: 1.0000 | OROMUCOSAL | Status: DC | PRN

## 2021-10-17 MED ORDER — METHOCARBAMOL 1000 MG/10ML IJ SOLN
500.0000 mg | Freq: Four times a day (QID) | INTRAVENOUS | Status: DC | PRN
  Filled 2021-10-17: qty 5

## 2021-10-17 MED ORDER — ONDANSETRON HCL 4 MG/2ML IJ SOLN: 4.0000 mg | Freq: Once | INTRAMUSCULAR | Status: DC | PRN

## 2021-10-17 MED ORDER — AMISULPRIDE (ANTIEMETIC) 5 MG/2ML IV SOLN: 10.0000 mg | Freq: Once | INTRAVENOUS | Status: DC | PRN

## 2021-10-17 MED ORDER — ROCURONIUM BROMIDE 10 MG/ML (PF) SYRINGE
PREFILLED_SYRINGE | INTRAVENOUS | Status: AC
  Filled 2021-10-17: qty 10

## 2021-10-17 MED ORDER — FENTANYL CITRATE (PF) 100 MCG/2ML IJ SOLN
INTRAMUSCULAR | Status: AC
  Administered 2021-10-17: 50 ug via INTRAVENOUS
  Filled 2021-10-17: qty 2

## 2021-10-17 MED ORDER — ASPIRIN EC 81 MG PO TBEC
81.0000 mg | DELAYED_RELEASE_TABLET | Freq: Every day | ORAL | Status: DC
  Administered 2021-10-17 – 2021-10-18 (×2): 81 mg via ORAL
  Filled 2021-10-17 (×2): qty 1

## 2021-10-17 MED ORDER — CHLORHEXIDINE GLUCONATE 0.12 % MT SOLN
15.0000 mL | Freq: Once | OROMUCOSAL | Status: AC
  Administered 2021-10-17: 15 mL via OROMUCOSAL
  Filled 2021-10-17: qty 15

## 2021-10-17 MED ORDER — PHENYLEPHRINE 40 MCG/ML (10ML) SYRINGE FOR IV PUSH (FOR BLOOD PRESSURE SUPPORT)
PREFILLED_SYRINGE | INTRAVENOUS | Status: DC | PRN
  Administered 2021-10-17 (×2): 40 ug via INTRAVENOUS
  Administered 2021-10-17: 80 ug via INTRAVENOUS

## 2021-10-17 MED ORDER — FENTANYL CITRATE (PF) 250 MCG/5ML IJ SOLN
INTRAMUSCULAR | Status: DC | PRN
  Administered 2021-10-17: 50 ug via INTRAVENOUS

## 2021-10-17 MED ORDER — LIDOCAINE 2% (20 MG/ML) 5 ML SYRINGE
INTRAMUSCULAR | Status: AC
  Filled 2021-10-17: qty 5

## 2021-10-17 MED ORDER — CEFAZOLIN SODIUM-DEXTROSE 2-4 GM/100ML-% IV SOLN
2.0000 g | Freq: Three times a day (TID) | INTRAVENOUS | Status: DC
  Administered 2021-10-17 – 2021-10-18 (×2): 2 g via INTRAVENOUS
  Filled 2021-10-17 (×3): qty 100

## 2021-10-17 MED ORDER — ACETAMINOPHEN 500 MG PO TABS
1000.0000 mg | ORAL_TABLET | Freq: Four times a day (QID) | ORAL | Status: AC
  Administered 2021-10-17 – 2021-10-18 (×4): 1000 mg via ORAL
  Filled 2021-10-17 (×4): qty 2

## 2021-10-17 MED ORDER — METOCLOPRAMIDE HCL 5 MG PO TABS: 5.0000 mg | ORAL_TABLET | Freq: Three times a day (TID) | ORAL | Status: DC | PRN

## 2021-10-17 MED ORDER — METHOCARBAMOL 500 MG PO TABS
500.0000 mg | ORAL_TABLET | Freq: Four times a day (QID) | ORAL | Status: DC | PRN
  Administered 2021-10-17 – 2021-10-18 (×3): 500 mg via ORAL
  Filled 2021-10-17 (×3): qty 1

## 2021-10-17 MED ORDER — FENTANYL CITRATE (PF) 100 MCG/2ML IJ SOLN: 50.0000 ug | Freq: Once | INTRAMUSCULAR | Status: AC

## 2021-10-17 MED ORDER — TRANEXAMIC ACID-NACL 1000-0.7 MG/100ML-% IV SOLN
1000.0000 mg | INTRAVENOUS | Status: AC
  Administered 2021-10-17: 1000 mg via INTRAVENOUS
  Filled 2021-10-17: qty 100

## 2021-10-17 MED ORDER — POVIDONE-IODINE 7.5 % EX SOLN
Freq: Once | CUTANEOUS | Status: DC
  Filled 2021-10-17: qty 118

## 2021-10-17 MED ORDER — VANCOMYCIN HCL 1000 MG IV SOLR
INTRAVENOUS | Status: AC
  Filled 2021-10-17: qty 20

## 2021-10-17 MED ORDER — BUPIVACAINE HCL (PF) 0.5 % IJ SOLN
INTRAMUSCULAR | Status: DC | PRN
  Administered 2021-10-17: 15 mL via PERINEURAL

## 2021-10-17 MED ORDER — LACTATED RINGERS IV SOLN: INTRAVENOUS | Status: DC

## 2021-10-17 MED ORDER — PHENYLEPHRINE HCL-NACL 20-0.9 MG/250ML-% IV SOLN
INTRAVENOUS | Status: DC | PRN
  Administered 2021-10-17: 100 ug/min via INTRAVENOUS
  Administered 2021-10-17: 50 ug/min via INTRAVENOUS

## 2021-10-17 MED ORDER — POVIDONE-IODINE 10 % EX SWAB
2.0000 "application " | Freq: Once | CUTANEOUS | Status: AC
  Administered 2021-10-17: 2 via TOPICAL

## 2021-10-17 MED ORDER — FUROSEMIDE 20 MG PO TABS
20.0000 mg | ORAL_TABLET | ORAL | Status: DC
  Administered 2021-10-18: 20 mg via ORAL
  Filled 2021-10-17: qty 1

## 2021-10-17 MED ORDER — SUGAMMADEX SODIUM 200 MG/2ML IV SOLN
INTRAVENOUS | Status: DC | PRN
  Administered 2021-10-17: 300 mg via INTRAVENOUS

## 2021-10-17 MED ORDER — ONDANSETRON HCL 4 MG PO TABS: 4.0000 mg | ORAL_TABLET | Freq: Four times a day (QID) | ORAL | Status: DC | PRN

## 2021-10-17 MED ORDER — FENTANYL CITRATE (PF) 250 MCG/5ML IJ SOLN
INTRAMUSCULAR | Status: AC
  Filled 2021-10-17: qty 5

## 2021-10-17 MED ORDER — FENTANYL CITRATE (PF) 100 MCG/2ML IJ SOLN: 25.0000 ug | INTRAMUSCULAR | Status: DC | PRN

## 2021-10-17 MED ORDER — ONDANSETRON HCL 4 MG/2ML IJ SOLN: 4.0000 mg | Freq: Four times a day (QID) | INTRAMUSCULAR | Status: DC | PRN

## 2021-10-17 MED ORDER — PROPOFOL 10 MG/ML IV BOLUS
INTRAVENOUS | Status: AC
  Filled 2021-10-17: qty 20

## 2021-10-17 MED ORDER — LIDOCAINE 2% (20 MG/ML) 5 ML SYRINGE
INTRAMUSCULAR | Status: DC | PRN
  Administered 2021-10-17: 100 mg via INTRAVENOUS

## 2021-10-17 MED ORDER — OXYCODONE HCL 5 MG PO TABS
5.0000 mg | ORAL_TABLET | ORAL | Status: DC | PRN
  Administered 2021-10-17: 5 mg via ORAL
  Administered 2021-10-17 – 2021-10-18 (×3): 10 mg via ORAL
  Filled 2021-10-17: qty 1
  Filled 2021-10-17 (×3): qty 2

## 2021-10-17 MED ORDER — PHENYLEPHRINE 40 MCG/ML (10ML) SYRINGE FOR IV PUSH (FOR BLOOD PRESSURE SUPPORT)
PREFILLED_SYRINGE | INTRAVENOUS | Status: AC
  Filled 2021-10-17: qty 10

## 2021-10-17 MED ORDER — DEXAMETHASONE SODIUM PHOSPHATE 10 MG/ML IJ SOLN
INTRAMUSCULAR | Status: AC
  Filled 2021-10-17: qty 1

## 2021-10-17 MED ORDER — MIDAZOLAM HCL 2 MG/2ML IJ SOLN
INTRAMUSCULAR | Status: AC
  Administered 2021-10-17: 1 mg via INTRAVENOUS
  Filled 2021-10-17: qty 2

## 2021-10-17 MED ORDER — 0.9 % SODIUM CHLORIDE (POUR BTL) OPTIME
TOPICAL | Status: DC | PRN
  Administered 2021-10-17 (×4): 1000 mL

## 2021-10-17 MED ORDER — LISINOPRIL 20 MG PO TABS
30.0000 mg | ORAL_TABLET | Freq: Every day | ORAL | Status: DC
  Administered 2021-10-18: 30 mg via ORAL
  Filled 2021-10-17: qty 1

## 2021-10-17 MED ORDER — ORAL CARE MOUTH RINSE: 15.0000 mL | Freq: Once | OROMUCOSAL | Status: AC

## 2021-10-17 MED ORDER — MENTHOL 3 MG MT LOZG: 1.0000 | LOZENGE | OROMUCOSAL | Status: DC | PRN

## 2021-10-17 MED ORDER — METOCLOPRAMIDE HCL 5 MG/ML IJ SOLN: 5.0000 mg | Freq: Three times a day (TID) | INTRAMUSCULAR | Status: DC | PRN

## 2021-10-17 MED ORDER — CEFAZOLIN SODIUM-DEXTROSE 2-4 GM/100ML-% IV SOLN
2.0000 g | INTRAVENOUS | Status: AC
  Administered 2021-10-17: 2 g via INTRAVENOUS
  Filled 2021-10-17: qty 100

## 2021-10-17 MED ORDER — EPHEDRINE SULFATE-NACL 50-0.9 MG/10ML-% IV SOSY
PREFILLED_SYRINGE | INTRAVENOUS | Status: DC | PRN
  Administered 2021-10-17 (×6): 5 mg via INTRAVENOUS

## 2021-10-17 MED ORDER — ONDANSETRON HCL 4 MG/2ML IJ SOLN
INTRAMUSCULAR | Status: DC | PRN
  Administered 2021-10-17: 4 mg via INTRAVENOUS

## 2021-10-17 MED ORDER — DEXAMETHASONE SODIUM PHOSPHATE 10 MG/ML IJ SOLN
INTRAMUSCULAR | Status: DC | PRN
  Administered 2021-10-17: 10 mg via INTRAVENOUS

## 2021-10-17 MED ORDER — ROCURONIUM BROMIDE 10 MG/ML (PF) SYRINGE
PREFILLED_SYRINGE | INTRAVENOUS | Status: DC | PRN
  Administered 2021-10-17: 100 mg via INTRAVENOUS

## 2021-10-17 MED ORDER — IRRISEPT - 450ML BOTTLE WITH 0.05% CHG IN STERILE WATER, USP 99.95% OPTIME
TOPICAL | Status: DC | PRN
  Administered 2021-10-17: 450 mL via TOPICAL

## 2021-10-17 MED ORDER — BUPIVACAINE LIPOSOME 1.3 % IJ SUSP
INTRAMUSCULAR | Status: DC | PRN
  Administered 2021-10-17: 10 mL

## 2021-10-17 MED ORDER — OXYCODONE HCL 5 MG/5ML PO SOLN: 5.0000 mg | Freq: Once | ORAL | Status: DC | PRN

## 2021-10-17 MED ORDER — HYDROMORPHONE HCL 2 MG PO TABS: 1.0000 mg | ORAL_TABLET | ORAL | Status: DC | PRN

## 2021-10-17 MED ORDER — DOCUSATE SODIUM 100 MG PO CAPS
100.0000 mg | ORAL_CAPSULE | Freq: Two times a day (BID) | ORAL | Status: DC
  Administered 2021-10-17 – 2021-10-18 (×2): 100 mg via ORAL
  Filled 2021-10-17 (×2): qty 1

## 2021-10-17 MED ORDER — PROPOFOL 10 MG/ML IV BOLUS
INTRAVENOUS | Status: DC | PRN
  Administered 2021-10-17: 150 mg via INTRAVENOUS

## 2021-10-17 MED ORDER — ACETAMINOPHEN 500 MG PO TABS
1000.0000 mg | ORAL_TABLET | Freq: Once | ORAL | Status: DC
  Filled 2021-10-17: qty 2

## 2021-10-17 MED ORDER — ACETAMINOPHEN 325 MG PO TABS: 325.0000 mg | ORAL_TABLET | Freq: Four times a day (QID) | ORAL | Status: DC | PRN

## 2021-10-17 MED ORDER — OXYCODONE HCL 5 MG PO TABS: 5.0000 mg | ORAL_TABLET | Freq: Once | ORAL | Status: DC | PRN

## 2021-10-17 MED ORDER — HYDROMORPHONE HCL 1 MG/ML IJ SOLN: 0.5000 mg | INTRAMUSCULAR | Status: DC | PRN

## 2021-10-17 MED ORDER — VANCOMYCIN HCL 1000 MG IV SOLR
INTRAVENOUS | Status: DC | PRN
  Administered 2021-10-17: 1000 mg

## 2021-10-17 MED ORDER — MIDAZOLAM HCL 2 MG/2ML IJ SOLN: 1.0000 mg | Freq: Once | INTRAMUSCULAR | Status: AC

## 2021-10-17 MED ORDER — ONDANSETRON HCL 4 MG/2ML IJ SOLN
INTRAMUSCULAR | Status: AC
  Filled 2021-10-17: qty 2

## 2021-10-17 SURGICAL SUPPLY — 83 items
ALCOHOL 70% 16 OZ (MISCELLANEOUS) ×2 IMPLANT
BAG COUNTER SPONGE SURGICOUNT (BAG) ×2 IMPLANT
BEARING CROSSLINK RSA 36 (Joint) ×1 IMPLANT
BIT DRILL 2.7 W/STOP DISP (BIT) ×1 IMPLANT
BIT DRILL QUICK REL 1/8 2PK SL (DRILL) IMPLANT
BIT DRILL TWIST 2.7 (BIT) ×1 IMPLANT
BLADE SAW SGTL 13X75X1.27 (BLADE) ×2 IMPLANT
CHLORAPREP W/TINT 26 (MISCELLANEOUS) ×2 IMPLANT
COMP REV AUG LG W/TAPER/GLENOI (Joint) ×2 IMPLANT
COMPONENT RV AUG LG W/TAPR/GLN (Joint) IMPLANT
COOLER ICEMAN CLASSIC (MISCELLANEOUS) ×2 IMPLANT
COVER SURGICAL LIGHT HANDLE (MISCELLANEOUS) ×2 IMPLANT
DRAPE INCISE IOBAN 66X45 STRL (DRAPES) ×3 IMPLANT
DRAPE U-SHAPE 47X51 STRL (DRAPES) ×4 IMPLANT
DRESSING AQUACEL AG SP 3.5X10 (GAUZE/BANDAGES/DRESSINGS) IMPLANT
DRILL QUICK RELEASE 1/8 INCH (DRILL) ×1
DRSG AQUACEL AG ADV 3.5X10 (GAUZE/BANDAGES/DRESSINGS) ×2 IMPLANT
DRSG AQUACEL AG SP 3.5X10 (GAUZE/BANDAGES/DRESSINGS) ×2
ELECT BLADE 4.0 EZ CLEAN MEGAD (MISCELLANEOUS) ×2
ELECT REM PT RETURN 9FT ADLT (ELECTROSURGICAL) ×2
ELECTRODE BLDE 4.0 EZ CLN MEGD (MISCELLANEOUS) ×1 IMPLANT
ELECTRODE REM PT RTRN 9FT ADLT (ELECTROSURGICAL) ×1 IMPLANT
GAUZE SPONGE 4X4 12PLY STRL LF (GAUZE/BANDAGES/DRESSINGS) ×2 IMPLANT
GLENOID SPHERE STD STRL 36MM (Orthopedic Implant) ×1 IMPLANT
GLOVE SRG 8 PF TXTR STRL LF DI (GLOVE) ×1 IMPLANT
GLOVE SURG LTX SZ7 (GLOVE) ×2 IMPLANT
GLOVE SURG LTX SZ8 (GLOVE) ×2 IMPLANT
GLOVE SURG UNDER POLY LF SZ7 (GLOVE) ×2 IMPLANT
GLOVE SURG UNDER POLY LF SZ8 (GLOVE) ×1
GOWN STRL REUS W/ TWL LRG LVL3 (GOWN DISPOSABLE) ×1 IMPLANT
GOWN STRL REUS W/ TWL XL LVL3 (GOWN DISPOSABLE) ×1 IMPLANT
GOWN STRL REUS W/TWL LRG LVL3 (GOWN DISPOSABLE) ×1
GOWN STRL REUS W/TWL XL LVL3 (GOWN DISPOSABLE) ×1
GUIDE MODEL REV SHLD LT (ORTHOPEDIC DISPOSABLE SUPPLIES) ×1 IMPLANT
HYDROGEN PEROXIDE 16OZ (MISCELLANEOUS) ×2 IMPLANT
JET LAVAGE IRRISEPT WOUND (IRRIGATION / IRRIGATOR) ×2
KIT BASIN OR (CUSTOM PROCEDURE TRAY) ×2 IMPLANT
KIT TURNOVER KIT B (KITS) ×2 IMPLANT
LAVAGE JET IRRISEPT WOUND (IRRIGATION / IRRIGATOR) ×1 IMPLANT
LOOP VESSEL MAXI BLUE (MISCELLANEOUS) ×2 IMPLANT
MANIFOLD NEPTUNE II (INSTRUMENTS) ×2 IMPLANT
NDL SUT 6 .5 CRC .975X.05 MAYO (NEEDLE) IMPLANT
NDL TAPERED W/ NITINOL LOOP (MISCELLANEOUS) ×1 IMPLANT
NEEDLE MAYO TAPER (NEEDLE)
NEEDLE TAPERED W/ NITINOL LOOP (MISCELLANEOUS) ×2 IMPLANT
NS IRRIG 1000ML POUR BTL (IV SOLUTION) ×2 IMPLANT
PACK SHOULDER (CUSTOM PROCEDURE TRAY) ×2 IMPLANT
PAD ARMBOARD 7.5X6 YLW CONV (MISCELLANEOUS) ×4 IMPLANT
PAD COLD SHLDR WRAP-ON (PAD) ×2 IMPLANT
PASSER SUT SWANSON 36MM LOOP (INSTRUMENTS) ×1 IMPLANT
PIN HUMERAL STMN 3.2MMX9IN (INSTRUMENTS) ×1 IMPLANT
PIN THREADED REVERSE (PIN) ×1 IMPLANT
REAMER GUIDE BUSHING SURG DISP (MISCELLANEOUS) ×1 IMPLANT
REAMER GUIDE W/SCREW AUG (MISCELLANEOUS) ×1 IMPLANT
RESTRAINT HEAD UNIVERSAL NS (MISCELLANEOUS) ×2 IMPLANT
SCREW BONE STRL 6.5MMX25MM (Screw) ×1 IMPLANT
SCREW LOCKING 4.75MMX15MM (Screw) ×3 IMPLANT
SCREW LOCKING STRL 4.75X25X3.5 (Screw) ×1 IMPLANT
SLING ARM IMMOBILIZER LRG (SOFTGOODS) ×2 IMPLANT
SOL PREP POV-IOD 4OZ 10% (MISCELLANEOUS) ×2 IMPLANT
SPONGE T-LAP 18X18 ~~LOC~~+RFID (SPONGE) ×2 IMPLANT
STEM HUMERAL STRL 9MMX83MM (Stem) ×1 IMPLANT
STRIP CLOSURE SKIN 1/2X4 (GAUZE/BANDAGES/DRESSINGS) ×2 IMPLANT
SUCTION FRAZIER HANDLE 10FR (MISCELLANEOUS) ×1
SUCTION TUBE FRAZIER 10FR DISP (MISCELLANEOUS) ×1 IMPLANT
SUT FIBERWIRE #2 38 T-5 BLUE (SUTURE)
SUT MAXBRAID (SUTURE) IMPLANT
SUT MNCRL AB 3-0 PS2 18 (SUTURE) ×2 IMPLANT
SUT SILK 2 0 TIES 10X30 (SUTURE) ×2 IMPLANT
SUT VIC AB 0 CT1 27 (SUTURE) ×5
SUT VIC AB 0 CT1 27XBRD ANBCTR (SUTURE) ×4 IMPLANT
SUT VIC AB 1 CT1 27 (SUTURE) ×2
SUT VIC AB 1 CT1 27XBRD ANBCTR (SUTURE) ×2 IMPLANT
SUT VIC AB 1 CT1 36 (SUTURE) ×1 IMPLANT
SUT VIC AB 2-0 CT1 27 (SUTURE) ×3
SUT VIC AB 2-0 CT1 TAPERPNT 27 (SUTURE) ×3 IMPLANT
SUT VICRYL 0 UR6 27IN ABS (SUTURE) ×6 IMPLANT
SUTURE FIBERWR #2 38 T-5 BLUE (SUTURE) IMPLANT
TOWEL GREEN STERILE (TOWEL DISPOSABLE) ×2 IMPLANT
TRAY FOL W/BAG SLVR 16FR STRL (SET/KITS/TRAYS/PACK) IMPLANT
TRAY FOLEY W/BAG SLVR 16FR LF (SET/KITS/TRAYS/PACK)
TRAY HUM REV SHOULDER STD +6 (Shoulder) ×1 IMPLANT
WATER STERILE IRR 1000ML POUR (IV SOLUTION) ×2 IMPLANT

## 2021-10-17 NOTE — Anesthesia Postprocedure Evaluation (Signed)
Anesthesia Post Note ? ?Patient: Gregory Blevins ? ?Procedure(s) Performed: LEFT REVERSE SHOULDER ARTHROPLASTY (Left: Shoulder) ? ?  ? ?Patient location during evaluation: PACU ?Anesthesia Type: General ?Level of consciousness: awake and alert, patient cooperative and oriented ?Pain management: pain level controlled ?Vital Signs Assessment: post-procedure vital signs reviewed and stable ?Respiratory status: spontaneous breathing, nonlabored ventilation and respiratory function stable ?Cardiovascular status: blood pressure returned to baseline and stable ?Postop Assessment: no apparent nausea or vomiting ?Anesthetic complications: no ? ? ?No notable events documented. ? ?Last Vitals:  ?Vitals:  ? 10/17/21 1605 10/17/21 1634  ?BP: 125/61 114/64  ?Pulse: 83 86  ?Resp: 19 18  ?Temp:  36.5 ?C  ?SpO2: 97% 96%  ?  ?Last Pain:  ?Vitals:  ? 10/17/21 1634  ?TempSrc: Oral  ?PainSc:   ? ? ?LLE Motor Response: Purposeful movement (10/17/21 1645) ?LLE Sensation: Full sensation (10/17/21 1645) ?RLE Motor Response: Purposeful movement (10/17/21 1645) ?RLE Sensation: Full sensation (10/17/21 1645) ?  ?  ? ?Mahogony Gilchrest,E. Saydie Gerdts ? ? ? ? ?

## 2021-10-17 NOTE — Transfer of Care (Signed)
Immediate Anesthesia Transfer of Care Note ? ?Patient: Gregory Blevins ? ?Procedure(s) Performed: LEFT REVERSE SHOULDER ARTHROPLASTY (Left: Shoulder) ? ?Patient Location: PACU ? ?Anesthesia Type:GA combined with regional for post-op pain ? ?Level of Consciousness: drowsy and patient cooperative ? ?Airway & Oxygen Therapy: Patient Spontanous Breathing ? ?Post-op Assessment: Report given to RN and Post -op Vital signs reviewed and stable ? ?Post vital signs: Reviewed and stable ? ?Last Vitals:  ?Vitals Value Taken Time  ?BP 148/65 10/17/21 1448  ?Temp    ?Pulse 89 10/17/21 1450  ?Resp 20 10/17/21 1450  ?SpO2 95 % 10/17/21 1450  ?Vitals shown include unvalidated device data. ? ?Last Pain:  ?Vitals:  ? 10/17/21 0942  ?TempSrc:   ?PainSc: 1   ?   ? ?Patients Stated Pain Goal: 3 (10/17/21 0092) ? ?Complications: No notable events documented. ?

## 2021-10-17 NOTE — Op Note (Signed)
NAME: Gregory Blevins, Gregory S. ?MEDICAL RECORD NO: [id number] ?ACCOUNT NO: [id number] ?DATE OF BIRTH: Dec 01, 1940 ?FACILITY: [facility] ?LOCATION: [facility] ?PHYSICIAN: Yetta Barre. Marlou Sa, MD ? ?Operative Report  ? ?PREOPERATIVE DIAGNOSIS:  Left shoulder severe glenohumeral arthritis. ? ?POSTOPERATIVE DIAGNOSIS:  Left shoulder severe glenohumeral arthritis. ? ?PROCEDURE:  Left shoulder reverse shoulder replacement using Biomet components including a large augmented baseplate with one central compression screw, 4 peripheral locking screws, glenosphere 36 standard and mini humeral stem size 9 with mini humeral  ?tray +6 taper offset and 36 standard bearing. ? ?SURGEON:  Yetta Barre. Marlou Sa, MD ? ?ASSISTANT:  Annie Main. ? ?INDICATIONS:  The patient is an 81 year old patient with severe left shoulder pain of long duration that has been worsening recently.  He presents now for operative management after explanation of risks and benefits. The patient had significant glenoid  ?deformity, which was deemed to be fixable with patient-specific instrumentation and planning. ? ?PROCEDURE IN DETAIL:  The patient was brought to the operating room where general anesthetic was induced.  Preoperative antibiotics administered.  Timeout was called.  Left shoulder was prescrubbed with hydrogen peroxide followed by alcohol and Betadine  ?and prepped with ChloraPrep solution and draped in sterile manner.  The patient was placed in the beach chair position with the head in neutral position.  Ioban used to cover the operative field.  Timeout was called.  The patient had very limited  ?preoperative range of motion of 0/30/80.  The deltopectoral approach was made.  Cephalic vein not present.  Plane between the deltoid and the pectoralis tendon was developed.  The pec tendon was released about 2 cm and the biceps tendon was tenodesed to  ?the pec tendon.  The rotator interval was then opened.  Subdeltoid adhesions were removed, and the deltoid was elevated manually  off its anterior attachment for increased visualization.  The entire shoulder was essentially medialized.  The coracoid  ?process was palpated and a plane developed between the subscap and the conjoined tendon.  Axillary nerve was palpated and protected at all times during the case.  Next, the rotator interval was opened up to the base of the coracoid.  Subscap mobilization ? was performed.  Circumflex vessels were ligated.  Next, the subscap was peeled off the lesser tuberosity and the capsule was peeled off the inferior 2 cm of the humeral neck.  Blunt dissection was then performed around to the 7 o'clock position.  The  ?anterior supraspinatus was also released in order to facilitate exposure and dislocation.  The humeral head was dislocated.  Retractors placed. The humerus was reamed up to size 9 mm.  Next, the head was cut in its native version, which was about 25 to  ?30 degrees.  A slightly larger head cut than normal was made because of the patient's preoperative contractions.  Broaching then performed up to size 9 and a cap was placed.  Next, attention was directed towards mobilization of the humerus and glenoid  ?exposure.  The rotator interval was opened up to the base of the coracoid with my index finger protect the axillary nerve, the capsule was released from the 12 o'clock to 6 o'clock position and had to be divided in places in order to facilitate exposure. ?  Next, the patient-specific guide was placed onto the glenoid.  Two guide pins were placed and reaming was performed in accordance with the preoperative plan, which did give [place] very well positioned implant.  Reaming for the augment was then performed.   ?Trial  component placed and found to have very good contact.  The baseplate was then placed in good position and alignment with excellent compression with the compression screw as well as with 4 peripheral locking screws.  At this time, the glenosphere  ?was placed, which was 36 standard.  The  smallest tennis implant, which was +6 taper offset with a standard polyethylene liner was then chosen.  This was difficult to relocate, but was relocatable.  The patient had excellent stability with internal and  ?external rotation along with extension and abduction.  Next, the humerus was dislocated.  The true glenosphere was placed followed by the true stem.  We were not able to mobilize the subscap enough to get back to the lesser tuberosity.  It should also be ? noted that some of the anterior osteophyte had to be removed in order to seat the glenosphere.  With the +6 offset and standard polyethylene, the patient had very good stability.  This was the implant that was in placed.  Same stability parameters were  ?maintained with improvement in external rotation to about 60 degrees forward flexion over 100 and abduction to 90.  Thorough irrigation was performed with pouring irrigation.  Axillary nerve again palpated and it was intact.  It should be noted that  ?IrriSept solution was utilized after the incision in multiple times during the case.  We also placed it into the humeral canal and placed vancomycin powder in the humeral canal after removing the IrriSept.  Then, the implant was placed.  Next, a thorough ? irrigation was performed and IrriSept solution and then vancomycin powder placed on the prosthesis.  Deltopectoral interval was then closed using #1 Vicryl suture followed by interrupted inverted 0 Vicryl suture, 2-0 Vicryl suture, and 3-0 Monocryl with ? Steri-Strips and Aquacel dressing applied.  The patient tolerated the procedure well without immediate complication.  Luke's assistance was required at all times for retraction, opening, closing, mobilization of tissue.  His assistance was a medical  ?necessity. He was required at all times during the case. ? ? ?NIK ?D: 10/17/2021 2:19:37 pm T: 10/17/2021 11:38:00 pm  ?JOB: [id number]/ [id number]  ?

## 2021-10-17 NOTE — Anesthesia Procedure Notes (Signed)
Anesthesia Regional Block: Interscalene brachial plexus block  ? ?Pre-Anesthetic Checklist: , timeout performed,  Correct Patient, Correct Site, Correct Laterality,  Correct Procedure, Correct Position, site marked,  Risks and benefits discussed,  Surgical consent,  Pre-op evaluation,  At surgeon's request and post-op pain management ? ?Laterality: Left ? ?Prep: chloraprep     ?  ?Needles:  ?Injection technique: Single-shot ? ?Needle Type: Echogenic Stimulator Needle   ? ? ?Needle Length: 10cm  ?Needle Gauge: 20  ? ? ? ?Additional Needles: ? ? ?Procedures:,,,, ultrasound used (permanent image in chart),,    ?Narrative:  ?Start time: 10/17/2021 10:45 AM ?End time: 10/17/2021 10:50 AM ?Injection made incrementally with aspirations every 5 mL. ? ?Performed by: Personally  ?Anesthesiologist: Lidia Collum, MD ? ?Additional Notes: ?Standard monitors applied. Skin prepped. Good needle visualization with ultrasound. Injection made in 5cc increments with no resistance to injection. Patient tolerated the procedure well. ? ? ? ? ?

## 2021-10-17 NOTE — H&P (Signed)
Gregory Blevins is an 81 y.o. male.   ?Chief Complaint: Left shoulder pain ?HPI: Gregory Blevins is an 81 year old patient with severe left shoulder arthritis.  CT scan has been done and I have templated it to be able to undergo reverse shoulder replacement.  He does have a lot of glenoid wear but we are able to get him back to neutral version.  Bone stock is diminished but sufficient for glenoid baseplate fixation.  He reports significant pain with activities of daily living as well as pain at rest.  He has failed a long course of nonoperative management including injections and activity modification.  The pain has evolved over the past several years to the point where it is untenable for him.  Presents now for operative management after explanation of risk and benefits.. ? ?Past Medical History:  ?Diagnosis Date  ? Chronic left shoulder pain   ? 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 Diverticulosis 2013  ? noted on colonoscopy  ? Fatty liver 05/2012  ? u/s done for mild/persistent elevation of LFTs  ? Hay fever   ? no issues now-? out grown  ? Hearing impairment   ? 11/2015 Audiology eval= not yet ready for hearing aids ([facility])  ? History of adenomatous polyp of colon   ? On multiple colonoscopies: most recent colonoscopy 05/31/18-->adenoma x 2, repeat 3-5 ys is optional.  ? History of basal cell carcinoma of skin   ? HTN (hypertension)   ? Hyperlipidemia   ? Lumbar spondylosis 04/2013  ? 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  ? Microhematuria 09/2017  ? On dipstick only.  Urine micro 09/2017 and 04/2018-->no RBCs.  ? OAB (overactive bladder)   ? ditropan xl 10 ineffective--pt d/c'd this 09/2018  ? Obesity   ? Osteoarthritis   ? Hips (L>R on x-ray 04/2013), shoulders, and knees (hx of adhesive capsulitis of shoulder)  ? Prediabetes   ? A1c 6.1% 08/2019 at [facility]  ? Rectal leakage   ? ? ?Past Surgical History:  ?Procedure Laterality Date  ? COLONOSCOPY    ? 2008 w/polyps,  09-22-2011  w/polyps.  01/2015 tubular adenoma x 1.  05/31/18 colonoscopy w/ two tubular adenomas.  ? COLONOSCOPY W/ POLYPECTOMY  2008; 09/22/11;01/2015  ? Tubular adenoma--recall 3 yrs per Dr. Louanne Belton at [place].  Then switched to Dr. Hilarie Fredrickson with [facility].  ? POLYPECTOMY    ? TONSILLECTOMY    ? UMBILICAL HERNIA REPAIR  2011  ? WISDOM TOOTH EXTRACTION Bilateral   ? ? ?Family History  ?Problem Relation Age of Onset  ? Hypertension Father   ? Alcoholism Father   ? Stomach cancer Father   ?     primary with mets to colon  ? Colon cancer Father   ? Dementia Mother   ? Asthma Brother   ? Diabetes Sister   ? Colon polyps Neg Hx   ? Esophageal cancer Neg Hx   ? Rectal cancer Neg Hx   ? ?Social History:  reports that he has never smoked. He has never used smokeless tobacco. He reports current alcohol use of about 1.0 standard drink per week. He reports that he does not use drugs. ? ?Allergies: No Known Allergies ? ?Medications Prior to Admission  ?Medication Sig Dispense Refill  ? acetaminophen (TYLENOL) 500 MG tablet Take 500-1,000 mg by mouth in the morning and at bedtime.    ? atorvastatin (LIPITOR) 20 MG tablet Take 1 tablet (20 mg total)  by mouth daily. 90 tablet 1  ? cholecalciferol (VITAMIN D) 25 MCG (1000 UNIT) tablet Take 1,000 Units by mouth daily.    ? furosemide (LASIX) 20 MG tablet 1 tab po every other day (Patient taking differently: Take 20 mg by mouth every other day.) 45 tablet 3  ? lisinopril (ZESTRIL) 30 MG tablet Take 1 tablet (30 mg total) by mouth daily. 90 tablet 1  ? meclizine (ANTIVERT) 25 MG tablet Take 1-2 tablets (25-50 mg total) by mouth 3 (three) times daily as needed for dizziness. 30 tablet 0  ? ondansetron (ZOFRAN-ODT) 8 MG disintegrating tablet Take 1 tablet (8 mg total) by mouth every 8 (eight) hours as needed for nausea or vomiting. 10 tablet 1  ? ? ?No results found for this or any previous visit (from the past 48 hour(s)). ?No results found. ? ?Review of Systems  ?Musculoskeletal:   Positive for arthralgias.  ?All other systems reviewed and are negative. ? ?Blood pressure (!) 151/76, pulse 91, temperature 98.7 ?F (37.1 ?C), temperature source Oral, resp. rate 18, height 5' 7"  (1.702 m), weight 102.1 kg, SpO2 98 %. ?Physical Exam ?Vitals reviewed.  ?HENT:  ?   Head: Normocephalic.  ?   Nose: Nose normal.  ?   Mouth/Throat:  ?   Mouth: Mucous membranes are moist.  ?Eyes:  ?   Pupils: Pupils are equal, round, and reactive to light.  ?Cardiovascular:  ?   Rate and Rhythm: Normal rate.  ?   Pulses: Normal pulses.  ?Pulmonary:  ?   Effort: Pulmonary effort is normal.  ?Abdominal:  ?   General: Abdomen is flat.  ?Musculoskeletal:  ?   Cervical back: Normal range of motion.  ?Skin: ?   General: Skin is warm.  ?   Capillary Refill: Capillary refill takes less than 2 seconds.  ?Neurological:  ?   General: No focal deficit present.  ?   Mental Status: He is alert.  ?Psychiatric:     ?   Mood and Affect: Mood normal.  ? Ortho exam demonstrates range of motion of that left shoulder of 0/60/90.  Rotator cuff strength difficult to assess due to limited motion.  Deltoid is functional.  EPL FPL interosseous strength intact with palpable radial pulse. ?  ? ?Assessment/Plan ?Impression is left shoulder arthritis.  Had a lengthy discussion with Gregory Blevins and his wife today about the risk and benefits of surgery including not limited to infection nerve vessel damage instability as well as the perioperative risk of stroke heart attack and other medical complications associated with the stress of surgery for patient in his age group.  Gregory Blevins is having a lot of pain in that shoulder as well as a lot of restricted motion.  I think the reverse replacement could predictably help his pain and he may get marginal improvement in his range of motion.  Patient understands the risk and benefits associated with surgical intervention.  Would like to proceed with surgery.  All questions answered ? ?Anderson Malta, MD ?10/17/2021, 10:20  AM ? ? ? ?

## 2021-10-17 NOTE — Anesthesia Preprocedure Evaluation (Addendum)
Anesthesia Evaluation  ?Patient identified by MRN, date of birth, ID band ?Patient awake ? ? ? ?Reviewed: ?Allergy & Precautions, NPO status , Patient's Chart, lab work & pertinent test results ? ?History of Anesthesia Complications ?Negative for: history of anesthetic complications ? ?Airway ?Mallampati: III ? ?TM Distance: >3 FB ?Neck ROM: Full ? ? ? Dental ? ?(+) Dental Advisory Given, Teeth Intact ?  ?Pulmonary ?neg pulmonary ROS,  ?  ?Pulmonary exam normal ? ? ? ? ? ? ? Cardiovascular ?hypertension, Pt. on medications ?Normal cardiovascular exam ? ? ?  ?Neuro/Psych ?negative neurological ROS ?   ? GI/Hepatic ?negative GI ROS, Neg liver ROS,   ?Endo/Other  ?negative endocrine ROS ? Renal/GU ?negative Renal ROS  ?negative genitourinary ?  ?Musculoskeletal ? ?(+) Arthritis ,  ? Abdominal ?  ?Peds ? Hematology ?negative hematology ROS ?(+)   ?Anesthesia Other Findings ? ? Reproductive/Obstetrics ? ?  ? ? ? ? ? ? ? ? ? ? ? ? ? ?  ?  ? ? ? ? ? ? ?Anesthesia Physical ?Anesthesia Plan ? ?ASA: 2 ? ?Anesthesia Plan: General  ? ?Post-op Pain Management: Regional block* and Tylenol PO (pre-op)*  ? ?Induction: Intravenous ? ?PONV Risk Score and Plan: 2 and Ondansetron, Dexamethasone, Treatment may vary due to age or medical condition and Midazolam ? ?Airway Management Planned: Oral ETT ? ?Additional Equipment: None ? ?Intra-op Plan:  ? ?Post-operative Plan: Extubation in OR ? ?Informed Consent: I have reviewed the patients History and Physical, chart, labs and discussed the procedure including the risks, benefits and alternatives for the proposed anesthesia with the patient or authorized representative who has indicated his/her understanding and acceptance.  ? ? ? ?Dental advisory given ? ?Plan Discussed with:  ? ?Anesthesia Plan Comments:   ? ? ? ? ? ? ?Anesthesia Quick Evaluation ? ?

## 2021-10-17 NOTE — Anesthesia Procedure Notes (Signed)
Procedure Name: Intubation ?Date/Time: 10/17/2021 11:11 AM ?Performed by: Thelma Comp, CRNA ?Pre-anesthesia Checklist: Patient identified, Emergency Drugs available, Suction available and Patient being monitored ?Patient Re-evaluated:Patient Re-evaluated prior to induction ?Oxygen Delivery Method: Circle System Utilized ?Preoxygenation: Pre-oxygenation with 100% oxygen ?Induction Type: IV induction ?Ventilation: Mask ventilation without difficulty ?Laryngoscope Size: Mac and 4 ?Grade View: Grade III ?Tube type: Oral ?Tube size: 7.5 mm ?Number of attempts: 1 ?Airway Equipment and Method: Stylet ?Placement Confirmation: ETT inserted through vocal cords under direct vision, positive ETCO2 and breath sounds checked- equal and bilateral ?Secured at: 23 cm ?Tube secured with: Tape ?Dental Injury: Teeth and Oropharynx as per pre-operative assessment  ? ? ? ? ?

## 2021-10-17 NOTE — Brief Op Note (Signed)
? ?  10/17/2021 ? ?2:11 PM ? ?PATIENT:  Gregory Blevins  81 y.o. male ? ?PRE-OPERATIVE DIAGNOSIS:  left shoulder osteoarthritis ? ?POST-OPERATIVE DIAGNOSIS:  left shoulder osteoarthritis ? ?PROCEDURE:  Procedure(s): ?LEFT REVERSE SHOULDER ARTHROPLASTY ? ?SURGEON:  Surgeon(s): ?Meredith Pel, MD ? ?ASSISTANT: magnant pa ? ?ANESTHESIA:   general ? ?EBL: 100 ml   ? ?Total I/O ?In: 1000 [I.V.:1000] ?Out: 750 [Urine:600; Blood:150] ? ?BLOOD ADMINISTERED: none ? ?DRAINS: none  ? ?LOCAL MEDICATIONS USED:  vanco ? ?SPECIMEN:  No Specimen ? ?COUNTS:  YES ? ?TOURNIQUET:  * No tourniquets in log * ? ?DICTATION: .Other Dictation: Dictation Number [id number] ? ?PLAN OF CARE: Admit for overnight observation ? ?PATIENT DISPOSITION:  PACU - hemodynamically stable ? ? ? ? ? ? ? ? ? ? ? ? ?  ?

## 2021-10-18 ENCOUNTER — Encounter (HOSPITAL_COMMUNITY): Payer: Self-pay | Admitting: Orthopedic Surgery

## 2021-10-18 DIAGNOSIS — M19012 Primary osteoarthritis, left shoulder: Secondary | ICD-10-CM | POA: Diagnosis not present

## 2021-10-18 DIAGNOSIS — Z85828 Personal history of other malignant neoplasm of skin: Secondary | ICD-10-CM | POA: Diagnosis not present

## 2021-10-18 DIAGNOSIS — I10 Essential (primary) hypertension: Secondary | ICD-10-CM | POA: Diagnosis not present

## 2021-10-18 DIAGNOSIS — Z79899 Other long term (current) drug therapy: Secondary | ICD-10-CM | POA: Diagnosis not present

## 2021-10-18 LAB — BASIC METABOLIC PANEL
Anion gap: 11 (ref 5–15)
BUN: 36 mg/dL — ABNORMAL HIGH (ref 8–23)
CO2: 20 mmol/L — ABNORMAL LOW (ref 22–32)
Calcium: 8.3 mg/dL — ABNORMAL LOW (ref 8.9–10.3)
Chloride: 104 mmol/L (ref 98–111)
Creatinine, Ser: 1.52 mg/dL — ABNORMAL HIGH (ref 0.61–1.24)
GFR, Estimated: 46 mL/min — ABNORMAL LOW (ref >60)
Glucose, Bld: 189 mg/dL — ABNORMAL HIGH (ref 70–99)
Potassium: 4.9 mmol/L (ref 3.5–5.1)
Sodium: 135 mmol/L (ref 135–145)

## 2021-10-18 LAB — CBC
HCT: 41.1 % (ref 39.0–52.0)
Hemoglobin: 13.3 g/dL (ref 13.0–17.0)
MCH: 30.2 pg (ref 26.0–34.0)
MCHC: 32.4 g/dL (ref 30.0–36.0)
MCV: 93.4 fL (ref 80.0–100.0)
Platelets: 159 10*3/uL (ref 150–400)
RBC: 4.4 MIL/uL (ref 4.22–5.81)
RDW: 12.2 % (ref 11.5–15.5)
WBC: 17.7 10*3/uL — ABNORMAL HIGH (ref 4.0–10.5)
nRBC: 0 % (ref 0.0–0.2)

## 2021-10-18 MED ORDER — ASPIRIN 81 MG PO TBEC
81.0000 mg | DELAYED_RELEASE_TABLET | Freq: Every day | ORAL | 0 refills | Status: DC
Start: 2021-10-19 — End: 2021-10-30

## 2021-10-18 MED ORDER — OXYCODONE HCL 5 MG PO TABS: 5.0000 mg | ORAL_TABLET | ORAL | 0 refills | Status: DC | PRN

## 2021-10-18 NOTE — TOC Initial Note (Signed)
Transition of Care (TOC) - Initial/Assessment Note  ? ? ?Patient Details  ?Name: Gregory Blevins ?MRN: [id number] ?Date of Birth: 17-Oct-1940 ? ?Transition of Care (TOC) CM/SW Contact:    ?Marilu Favre, RN ?Phone Number: ?10/18/2021, 12:39 PM ? ?Clinical Narrative:                 ?Spoke to patient via phone. Confirmed face sheet information. Patient from home wife. No preference in home health agency .  ? ?Tommi Rumps with [facility] accepted referral.  ? ?3 C staff provided needed DME  ? ?Expected Discharge Plan: [place] ?Barriers to Discharge: No Barriers Identified ? ? ?Patient Goals and CMS Choice ?Patient states their goals for this hospitalization and ongoing recovery are:: to return to home ?CMS Medicare.gov Compare Post Acute Care list provided to:: Patient ?Choice offered to / list presented to : Patient ? ?Expected Discharge Plan and Services ?Expected Discharge Plan: [place] ?  ?Discharge Planning Services: CM Consult ?Post Acute Care Choice: Home Health ?Living arrangements for the past 2 months: [place] ?                ?DME Arranged: N/A ?  ?  ?  ?  ?HH Arranged: PT ?[place] Agency: [place] ?Date HH Agency Contacted: 10/18/21 ?Time [place]: 0272 ?Representative spoke with at [place]: Tommi Rumps ? ?Prior Living Arrangements/Services ?Living arrangements for the past 2 months: [place] ?Lives with:: Spouse ?Patient language and need for interpreter reviewed:: Yes ?Do you feel safe going back to the place where you live?: Yes      ?Need for Family Participation in Patient Care: Yes (Comment) ?Care giver support system in place?: Yes (comment) ?  ?Criminal Activity/Legal Involvement Pertinent to Current Situation/Hospitalization: No - Comment as needed ? ?Activities of Daily Living ?  ?  ? ?Permission Sought/Granted ?  ?Permission granted to share information with : No ?   ?   ?   ?   ? ?Emotional Assessment ?   ?Attitude/Demeanor/Rapport: Engaged ?Affect (typically observed): Accepting ?Orientation: : Oriented to Self, Oriented to Place, Oriented to  Time, Oriented to Situation ?Alcohol / Substance Use: Not Applicable ?Psych Involvement: No (comment) ? ?Admission diagnosis:  S/P reverse total shoulder arthroplasty, left [Z96.612] ?Patient Active Problem List  ? Diagnosis Date Noted  ? 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 Prostate cancer screening 10/21/2015  ? Preventative health care 04/10/2014  ? 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 Facet arthropathy, lumbar 04/22/2013  ? Pelvic pain in male 03/21/2013  ? Health maintenance examination 09/27/2012  ? HTN (hypertension), benign 09/27/2012  ? Basal cell carcinoma of skin 07/12/2011  ? Colon polyp 07/12/2011  ? High risk medication use 07/12/2011  ? Elevation of level of transaminase or lactic acid dehydrogenase (LDH) 08/31/2010  ? Onychomycosis due to dermatophyte 08/31/2010  ? Osteoarthrosis involving lower leg 08/31/2010  ? Other and unspecified hyperlipidemia 08/31/2010  ? 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 Myalgia and myositis, unspecified 10/19/2009  ? 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 Morbid obesity ([place]) 06/17/2009  ? 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 ?PCP:  Tammi Sou, MD ?Pharmacy:   ?[id number] [place], [place] - [address] ?[address] ?[place] [address]?Phone: [phone]: [phone]? ?[facility] [address] [facility] ?[place] [address] ?Phone: [phone] Fax: [phone] ? ? ? ? ?Social Determinants of Health (SDOH) Interventions ?  ? ?Readmission Risk Interventions ?   ? View : No data to display.  ?  ?  ?  ? ? ? ?

## 2021-10-18 NOTE — Plan of Care (Signed)
Pt doing well. Pt given D/C instructions with verbal understanding. Rx's were sent to the pharmacy by MD. Pt's incision is clean and dry with no sign of infection. Pt's IV was removed prior to D/C. Pt D/C'd home via wheelchair per MD order. Pt is stable @ D/C and has no other needs at this time. Holli Humbles, RN  ?

## 2021-10-18 NOTE — Evaluation (Signed)
Occupational Therapy Evaluation ?Patient Details ?Name: Gregory Blevins ?MRN: [id number] ?DOB: [birth year] ?Today's Date: 10/18/2021 ? ? ?History of Present Illness Gregory Blevins is a 81 yo male who underwent L reverse shoulder arthoplasty. PMHx: diverticulosis, HTN, HLD, obesity, OA, drediabetes  ? ?Clinical Impression ?  ?Gregory Blevins was evaluated s/p the above L reverse TSA. He is indep at baseline without AD, he lives with his wife who is able to assist 24/7 at d/c. Reviewed all reverse TSA precautions, NWB and no shoulder AROM restriction, pt and his wife verbalized great understanding. Pt benefited from cues throughout to maintain precautions during ADLs. Overall he needs mod A for dressing and ADLs and min guard for transfers and ambulation with SPC in the room. Pt tolerated all elbow, wrist, hand AAROM well and demonstrated good understanding of LUE pendulums. Pt would benefit from continued OT should his LOS continue. Recommend pt follow surgeons recommendations for follow up therapies, however he would benefit from [facility] therapies at d/c to ensure safe transition into the home environment.   ? ?Recommendations for follow up therapy are one component of a multi-disciplinary discharge planning process, led by the attending physician.  Recommendations may be updated based on patient status, additional functional criteria and insurance authorization.  ? ?Follow Up Recommendations ? Follow physician's recommendations for discharge plan and follow up therapies  ?  ?Assistance Recommended at Discharge Frequent or constant Supervision/Assistance  ?Patient can return home with the following A little help with walking and/or transfers;A lot of help with bathing/dressing/bathroom;Assist for transportation;Help with stairs or ramp for entrance;Direct supervision/assist for medications management ? ?  ?Functional Status Assessment ? Patient has had a recent decline in their functional status and demonstrates the ability to make significant  improvements in function in a reasonable and predictable amount of time.  ?Equipment Recommendations ? BSC/3in1;Other (comment) (cane)  ?  ?   ?Precautions / Restrictions Precautions ?Precautions: Fall;Shoulder ?Type of Shoulder Precautions: reverse TSA ?Shoulder Interventions: Shoulder sling/immobilizer;At all times;Off for dressing/bathing/exercises ?Precaution Booklet Issued: Yes (comment) ?Precaution Comments: reviewed all restrictions and precatuions ?Required Braces or Orthoses: Sling ?Restrictions ?Weight Bearing Restrictions: Yes ?LUE Weight Bearing: Non weight bearing  ? ?  ? ?Mobility Bed Mobility ?Overal bed mobility: Needs Assistance ?Bed Mobility: Supine to Sit ?  ?  ?Supine to sit: Supervision, HOB elevated ?  ?  ?General bed mobility comments: cues for LUE NWB ?  ? ?Transfers ?Overall transfer level: Needs assistance ?Equipment used: Straight cane ?Transfers: Sit to/from Stand ?Sit to Stand: Min guard ?  ?  ?  ?  ?  ?General transfer comment: required increased time and effort and 2x attempts but was able to stand without physical assist ?  ? ?  ?Balance Overall balance assessment: Needs assistance ?Sitting-balance support: Feet supported ?Sitting balance-Leahy Scale: Good ?  ?  ?Standing balance support: Single extremity supported, During functional activity ?Standing balance-Leahy Scale: Fair ?  ?  ?  ?  ?  ?  ?  ?  ?  ?  ?  ?  ?   ? ?ADL either performed or assessed with clinical judgement  ? ?ADL Overall ADL's : Needs assistance/impaired ?Eating/Feeding: Set up;Sitting ?  ?Grooming: Set up;Sitting ?  ?Upper Body Bathing: Moderate assistance;Sitting ?  ?Lower Body Bathing: Moderate assistance;Sit to/from stand ?  ?Upper Body Dressing : Moderate assistance;Sitting ?  ?Lower Body Dressing: Moderate assistance;Sit to/from stand ?  ?Toilet Transfer: Minimal assistance;Ambulation [facility]) ?  ?Toileting- Clothing Manipulation and Hygiene: Min guard;Sitting/lateral lean ?  ?  ?  ?  Functional mobility during  ADLs: Minimal assistance;Cane ?General ADL Comments: educated on compensatory techniques for bathing and dressing, sling wear/care and safe mobility with SPC. He required cues throughout to maintain no AROM and NWB.  ? ? ? ?Vision Baseline Vision/History: 0 No visual deficits ?Ability to See in Adequate Light: 0 Adequate ?Vision Assessment?: No apparent visual deficits  ?   ?   ?   ? ?Pertinent Vitals/Pain Pain Assessment ?Pain Assessment: Faces ?Faces Pain Scale: Hurts little more ?Pain Location: sx site/shoulder ?Pain Descriptors / Indicators: Discomfort, Grimacing ?Pain Intervention(s): Limited activity within patient's tolerance, Monitored during session  ? ? ? ?Hand Dominance Left ?  ?Extremity/Trunk Assessment Upper Extremity Assessment ?Upper Extremity Assessment: LUE deficits/detail ?LUE Deficits / Details: s/p reverst TSA, mild edema noted distally. AAROM of elbow, wrist and hand WFL ?LUE Sensation: decreased light touch ?LUE Coordination: decreased fine motor ?  ?Lower Extremity Assessment ?Lower Extremity Assessment: Generalized weakness ?  ?Cervical / Trunk Assessment ?Cervical / Trunk Assessment: Other exceptions ?Cervical / Trunk Exceptions: icreased body habitus ?  ?Communication   ?  ?Cognition Arousal/Alertness: Awake/alert ?Behavior During Therapy: Flat affect ?Overall Cognitive Status: Within Functional Limits for tasks assessed ?  ?  ?  ?  ?  ?  ?General Comments: overall WFL - however required cues for carryover of no ROM of shoulder during ADLs ?  ?  ?General Comments  VSS on RA ? ?  ?Exercises Exercises: Shoulder ?Shoulder Exercises ?Pendulum Exercise: Left, 10 reps, PROM ?Elbow Flexion: AAROM, 10 reps, Seated ?Elbow Extension: AAROM, Left, 10 reps, Seated ?Wrist Flexion: AAROM, Left, 10 reps, Seated ?Wrist Extension: AAROM, Left, 10 reps, Seated ?Digit Composite Flexion: Left, 10 reps, Seated ?Composite Extension: AAROM, Left, 10 reps, Seated ?  ?Shoulder Instructions Shoulder  Instructions ?Donning/doffing shirt without moving shoulder: Moderate assistance ?Method for sponge bathing under operated UE: Moderate assistance ?Donning/doffing sling/immobilizer: Moderate assistance ?Correct positioning of sling/immobilizer: Patient able to independently direct caregiver;Caregiver independent with task ?Pendulum exercises (written home exercise program): Minimal assistance ?ROM for elbow, wrist and digits of operated UE: Minimal assistance ?Sling wearing schedule (on at all times/off for ADL's): Caregiver independent with task;Patient able to independently direct caregiver ?Proper positioning of operated UE when showering: Moderate assistance ?Positioning of UE while sleeping: Supervision/safety;Caregiver independent with task  ? ? ?Home Living Family/patient expects to be discharged to:: Private residence ?Living Arrangements: Spouse/significant other ?Available Help at Discharge: Family;Available 24 hours/day ?Type of Home: House ?Home Access: Stairs to enter ?Entrance Stairs-Number of Steps: 4-5 ?Entrance Stairs-Rails: Left ?Home Layout: One level ?  ?  ?Bathroom Shower/Tub: Walk-in shower ?  ?Bathroom Toilet: Standard ?  ?  ?Home Equipment: Shower seat ?  ?  ?  ? ?  ?Prior Functioning/Environment Prior Level of Function : Independent/Modified Independent;Driving ?  ?  ?  ?  ?  ?  ?Mobility Comments: no AD at baseline ?ADLs Comments: indep, drives ?  ? ?  ?  ?OT Problem List: Decreased strength;Decreased range of motion;Decreased activity tolerance;Impaired balance (sitting and/or standing);Impaired UE functional use;Decreased knowledge of precautions ?  ?   ?OT Treatment/Interventions:    ?  ?OT Goals(Current goals can be found in the care plan section) Acute Rehab OT Goals ?Patient Stated Goal: better walking ?OT Goal Formulation: With patient ?Time For Goal Achievement: 10/18/21 ?Potential to Achieve Goals: Good ?ADL Goals ?Additional ADL Goal #1: Pt will complete UB dressing including  sling with supervision A while maintaing shoulder precautions  ? ?AM-PAC OT "6 Clicks"  Daily Activity     ?Outcome Measure Help from another person eating meals?: A Little ?Help from another person taking care of person

## 2021-10-18 NOTE — Evaluation (Signed)
Physical Therapy Evaluation ? ?Patient Details ?Name: Gregory Blevins ?MRN: [id number] ?DOB: September 01, 1940 ?Today's Date: 10/18/2021 ? ?History of Present Illness ? Gregory Blevins is a 81 yo male who underwent L reverse shoulder arthoplasty. PMHx: diverticulosis, HTN, HLD, obesity, OA, prediabetes ?  ?Clinical Impression ? Pt admitted with above diagnosis. Pt currently with functional limitations due to the deficits listed below (see PT Problem List). At the time of PT eval pt was able to perform transfers and ambulation with up to min assist for balance support, safety, and maintenance of precautions. Pt with difficulty sequencing with the [facility] but requires UE support for balance. Pt would benefit from home health follow up to decrease risk for falls. Acutely, pt will benefit from skilled PT to increase their independence and safety with mobility to allow discharge to the venue listed below.      ?   ? ?Recommendations for follow up therapy are one component of a multi-disciplinary discharge planning process, led by the attending physician.  Recommendations may be updated based on patient status, additional functional criteria and insurance authorization. ? ?Follow Up Recommendations Follow physician's recommendations for discharge plan and follow up therapies (HHPT if able to get it) ? ?  ?Assistance Recommended at Discharge Frequent or constant Supervision/Assistance  ?Patient can return home with the following ? A little help with walking and/or transfers;Assistance with cooking/housework;Assist for transportation;Help with stairs or ramp for entrance ? ?  ?Equipment Recommendations Cane;BSC/3in1  ?Recommendations for Other Services ?    ?  ?Functional Status Assessment Patient has had a recent decline in their functional status and demonstrates the ability to make significant improvements in function in a reasonable and predictable amount of time.  ? ?  ?Precautions / Restrictions Precautions ?Precautions: Fall;Shoulder ?Type  of Shoulder Precautions: reverse TSA ?Shoulder Interventions: Shoulder sling/immobilizer;At all times;Off for dressing/bathing/exercises ?Precaution Booklet Issued: Yes (comment) ?Precaution Comments: reviewed all restrictions and precatuions ?Required Braces or Orthoses: Sling ?Restrictions ?Weight Bearing Restrictions: Yes ?LUE Weight Bearing: Non weight bearing  ? ?  ? ?Mobility ? Bed Mobility ?Overal bed mobility: Needs Assistance ?Bed Mobility: Rolling, Sidelying to Sit, Sit to Sidelying ?Rolling: Min assist ?Sidelying to sit: Min assist ?  ?  ?Sit to sidelying: Min assist ?General bed mobility comments: cues for LUE safety with positioning. HOB flat and rails lowered to simulate home environment. Pt was able to complete log roll with VC's for optimal technique and min assist. ?  ? ?Transfers ?Overall transfer level: Needs assistance ?Equipment used: Straight cane ?Transfers: Sit to/from Stand ?Sit to Stand: Min guard ?  ?  ?  ?  ?  ?General transfer comment: Increased time to power up to full standing position. Appears unsteady but able to recover with use of cane. ?  ? ?Ambulation/Gait ?Ambulation/Gait assistance: Min guard ?Gait Distance (Feet): 150 Feet ?Assistive device: Straight cane, 1 person hand held assist ?Gait Pattern/deviations: Step-to pattern, Decreased stride length, Step-through pattern, Trunk flexed ?Gait velocity: Decreased ?Gait velocity interpretation: <1.31 ft/sec, indicative of household ambulator ?  ?General Gait Details: Pt with difficulty sequencing with the SPC. Demonstrating a step-to gait pattern and difficulty progressing to a step-through pattern. Ambulation appeared more fluid with HHA instead of SPC. ? ?Stairs ?Stairs: Yes ?Stairs assistance: Min guard ?Stair Management: One rail Left, Step to pattern, Sideways ?Number of Stairs: 5 ?General stair comments: VC's for sequencing and general safety. Wife present for education. ? ?Wheelchair Mobility ?  ? ?Modified Rankin (Stroke  Patients Only) ?  ? ?  ? ?  Balance Overall balance assessment: Needs assistance ?Sitting-balance support: Feet supported ?Sitting balance-Leahy Scale: Good ?  ?  ?Standing balance support: Single extremity supported, During functional activity ?Standing balance-Leahy Scale: Fair ?  ?  ?  ?  ?  ?  ?  ?  ?  ?  ?  ?  ?   ? ? ? ?Pertinent Vitals/Pain Pain Assessment ?Pain Assessment: Faces ?Faces Pain Scale: Hurts little more ?Pain Location: sx site/shoulder ?Pain Descriptors / Indicators: Discomfort, Grimacing ?Pain Intervention(s): Limited activity within patient's tolerance, Monitored during session, Repositioned  ? ? ?Home Living Family/patient expects to be discharged to:: Private residence ?Living Arrangements: Spouse/significant other ?Available Help at Discharge: Family;Available 24 hours/day ?Type of Home: House ?Home Access: Stairs to enter ?Entrance Stairs-Rails: Left ?Entrance Stairs-Number of Steps: 4-5 ?  ?Home Layout: One level ?Home Equipment: Shower seat ?   ?  ?Prior Function Prior Level of Function : Independent/Modified Independent;Driving ?  ?  ?  ?  ?  ?  ?Mobility Comments: no AD at baseline ?ADLs Comments: indep, drives, takes care of the lawn, community ambulator ?  ? ? ?Hand Dominance  ? Dominant Hand: Left ? ?  ?Extremity/Trunk Assessment  ? Upper Extremity Assessment ?Upper Extremity Assessment: LUE deficits/detail ?LUE Deficits / Details: s/p reverse TSA, mild edema noted distally. AAROM of elbow, wrist and hand WFL ?LUE Sensation: decreased light touch ?LUE Coordination: decreased fine motor ?  ? ?Lower Extremity Assessment ?Lower Extremity Assessment: Generalized weakness ?  ? ?Cervical / Trunk Assessment ?Cervical / Trunk Assessment: Other exceptions ?Cervical / Trunk Exceptions: increased body habitus  ?Communication  ? Communication: HOH  ?Cognition Arousal/Alertness: Awake/alert ?Behavior During Therapy: Flat affect ?Overall Cognitive Status: Within Functional Limits for tasks  assessed ?  ?  ?  ?  ?  ?  ?  ?  ?  ?  ?  ?  ?  ?  ?  ?  ?General Comments: overall WFL - however required cues for carryover of no ROM of shoulder during functional mobility ?  ?  ? ?  ?General Comments General comments (skin integrity, edema, etc.): VSS on RA ? ?  ?Exercises    ? ?Assessment/Plan  ?  ?PT Assessment Patient needs continued PT services  ?PT Problem List Decreased strength;Decreased activity tolerance;Decreased range of motion;Decreased mobility;Decreased balance;Decreased knowledge of use of DME;Decreased safety awareness;Decreased knowledge of precautions;Pain ? ?   ?  ?PT Treatment Interventions DME instruction;Gait training;Functional mobility training;Stair training;Therapeutic activities;Therapeutic exercise;Balance training;Patient/family education   ? ?PT Goals (Current goals can be found in the Care Plan section)  ?Acute Rehab PT Goals ?Patient Stated Goal: Home today ?PT Goal Formulation: With patient/family ?Time For Goal Achievement: 10/25/21 ?Potential to Achieve Goals: Good ? ?  ?Frequency Min 3X/week ?  ? ? ?Co-evaluation   ?  ?  ?  ?  ? ? ?  ?AM-PAC PT "6 Clicks" Mobility  ?Outcome Measure Help needed turning from your back to your side while in a flat bed without using bedrails?: A Little ?Help needed moving from lying on your back to sitting on the side of a flat bed without using bedrails?: A Little ?Help needed moving to and from a bed to a chair (including a wheelchair)?: A Little ?Help needed standing up from a chair using your arms (e.g., wheelchair or bedside chair)?: A Little ?Help needed to walk in hospital room?: A Little ?Help needed climbing 3-5 steps with a railing? : A Little ?6 Click Score: 18 ? ?  ?  End of Session Equipment Utilized During Treatment: Gait belt;Other (comment) (sling) ?Activity Tolerance: Patient tolerated treatment well ?Patient left: in bed;with call bell/phone within reach;with family/visitor present ?Nurse Communication: Mobility status ?PT Visit  Diagnosis: Unsteadiness on feet (R26.81);Difficulty in walking, not elsewhere classified (R26.2);Pain ?Pain - Right/Left: Left ?Pain - part of body: Shoulder ?  ? ?Time: 6893-4068 ?PT Time Calculation (min) (ACUTE

## 2021-10-18 NOTE — Addendum Note (Signed)
Addendum  created 10/18/21 1232 by Annye Asa, MD  ? Intraprocedure Staff edited  ?  ?

## 2021-10-20 ENCOUNTER — Telehealth: Payer: Self-pay

## 2021-10-20 ENCOUNTER — Telehealth: Payer: Self-pay | Admitting: Orthopedic Surgery

## 2021-10-20 NOTE — Telephone Encounter (Signed)
I called he is doing better

## 2021-10-20 NOTE — Telephone Encounter (Signed)
Patients wife is expressing concerns about how patient is acting since surgery. She is asking for one of you to call her to further discuss.  She stated patient is sleeping more than normal, he is not eating, he is not drinking much fluid at all, as very limited ability to move around. [phone] ?I have asked Sheri to help get some HHPT initiated for gait training ?

## 2021-10-20 NOTE — Telephone Encounter (Signed)
Doesn't really need PT for shoulder, but the PT in hospital was concerned about his balance and thought he would benefit from balance and gait focused physical therapy so that's why he got set up for home health

## 2021-10-20 NOTE — Telephone Encounter (Signed)
Pt's wife Mechele Claude called requesting a call back. She states that [facility] called but never called back. Pt's wife is unsure if he is to have PT. Alvis Lemmings states to have no orders. Pt's wife is confused on if pt needs PT, OT or skilled nursing. She states to please call her to inform her of what needs to be done. Phone number is [phone]. ?

## 2021-10-22 DIAGNOSIS — K760 Fatty (change of) liver, not elsewhere classified: Secondary | ICD-10-CM | POA: Diagnosis not present

## 2021-10-22 DIAGNOSIS — Z86010 Personal history of colonic polyps: Secondary | ICD-10-CM | POA: Diagnosis not present

## 2021-10-22 DIAGNOSIS — M47816 Spondylosis without myelopathy or radiculopathy, lumbar region: Secondary | ICD-10-CM | POA: Diagnosis not present

## 2021-10-22 DIAGNOSIS — H9190 Unspecified hearing loss, unspecified ear: Secondary | ICD-10-CM | POA: Diagnosis not present

## 2021-10-22 DIAGNOSIS — Z85828 Personal history of other malignant neoplasm of skin: Secondary | ICD-10-CM | POA: Diagnosis not present

## 2021-10-22 DIAGNOSIS — K5790 Diverticulosis of intestine, part unspecified, without perforation or abscess without bleeding: Secondary | ICD-10-CM | POA: Diagnosis not present

## 2021-10-22 DIAGNOSIS — E785 Hyperlipidemia, unspecified: Secondary | ICD-10-CM | POA: Diagnosis not present

## 2021-10-22 DIAGNOSIS — M170 Bilateral primary osteoarthritis of knee: Secondary | ICD-10-CM | POA: Diagnosis not present

## 2021-10-22 DIAGNOSIS — Z471 Aftercare following joint replacement surgery: Secondary | ICD-10-CM | POA: Diagnosis not present

## 2021-10-22 DIAGNOSIS — E669 Obesity, unspecified: Secondary | ICD-10-CM | POA: Diagnosis not present

## 2021-10-22 DIAGNOSIS — M19011 Primary osteoarthritis, right shoulder: Secondary | ICD-10-CM | POA: Diagnosis not present

## 2021-10-22 DIAGNOSIS — I10 Essential (primary) hypertension: Secondary | ICD-10-CM | POA: Diagnosis not present

## 2021-10-22 DIAGNOSIS — R7303 Prediabetes: Secondary | ICD-10-CM | POA: Diagnosis not present

## 2021-10-22 DIAGNOSIS — Z96612 Presence of left artificial shoulder joint: Secondary | ICD-10-CM | POA: Diagnosis not present

## 2021-10-22 DIAGNOSIS — M160 Bilateral primary osteoarthritis of hip: Secondary | ICD-10-CM | POA: Diagnosis not present

## 2021-10-22 DIAGNOSIS — Z8719 Personal history of other diseases of the digestive system: Secondary | ICD-10-CM | POA: Diagnosis not present

## 2021-10-22 DIAGNOSIS — Z6834 Body mass index (BMI) 34.0-34.9, adult: Secondary | ICD-10-CM | POA: Diagnosis not present

## 2021-10-22 DIAGNOSIS — Z9181 History of falling: Secondary | ICD-10-CM | POA: Diagnosis not present

## 2021-10-22 DIAGNOSIS — Z7982 Long term (current) use of aspirin: Secondary | ICD-10-CM | POA: Diagnosis not present

## 2021-10-22 DIAGNOSIS — N3281 Overactive bladder: Secondary | ICD-10-CM | POA: Diagnosis not present

## 2021-10-25 ENCOUNTER — Telehealth: Payer: Self-pay

## 2021-10-25 DIAGNOSIS — M170 Bilateral primary osteoarthritis of knee: Secondary | ICD-10-CM | POA: Diagnosis not present

## 2021-10-25 DIAGNOSIS — I10 Essential (primary) hypertension: Secondary | ICD-10-CM | POA: Diagnosis not present

## 2021-10-25 DIAGNOSIS — M19011 Primary osteoarthritis, right shoulder: Secondary | ICD-10-CM | POA: Diagnosis not present

## 2021-10-25 DIAGNOSIS — M160 Bilateral primary osteoarthritis of hip: Secondary | ICD-10-CM | POA: Diagnosis not present

## 2021-10-25 DIAGNOSIS — M47816 Spondylosis without myelopathy or radiculopathy, lumbar region: Secondary | ICD-10-CM | POA: Diagnosis not present

## 2021-10-25 DIAGNOSIS — Z471 Aftercare following joint replacement surgery: Secondary | ICD-10-CM | POA: Diagnosis not present

## 2021-10-25 NOTE — Telephone Encounter (Signed)
Attempted to contact patient and or wife to answer concerns however was unable to leave a voicemail.  ?

## 2021-10-25 NOTE — Telephone Encounter (Signed)
I called and spoke with patient's wife.  Shoulder pain is doing well.  They are mostly concerned about his decreased appetite.  He has been able to decrease the amount of opioid medication that he has been taking and has only been taking Tylenol in the last day or 2.  Still having some difficulty with decreased appetite and does not really want to eat or drink anything.  Occasional nausea but nothing consistent though he has had some vomiting episodes over the weekend.  Still passing gas and has had a couple small bowel movements in the last couple days.  No abdominal pain and denies chest pain or shortness of breath.  They feel he is trending better slightly.  They will try a different laxative and see if this improves his situation.  Recommended they call the office on Thursday if they have no improvement or his symptoms worsen.

## 2021-10-25 NOTE — Telephone Encounter (Signed)
Pt's wife called and lvm on triage phone stating she is still concerned with husband. Says he is still not eating and drinking well. She would like a cb to discuss  ? ?Cb# [phone] ?

## 2021-10-26 ENCOUNTER — Emergency Department (HOSPITAL_COMMUNITY): Payer: Medicare Other

## 2021-10-26 ENCOUNTER — Telehealth: Payer: Self-pay | Admitting: Radiology

## 2021-10-26 ENCOUNTER — Inpatient Hospital Stay (HOSPITAL_COMMUNITY)
Admission: EM | Admit: 2021-10-26 | Discharge: 2021-10-30 | DRG: 378 | Disposition: A | Discharge status: Home Health | Payer: Medicare Other | Attending: Internal Medicine | Admitting: Internal Medicine

## 2021-10-26 ENCOUNTER — Other Ambulatory Visit: Payer: Self-pay

## 2021-10-26 ENCOUNTER — Telehealth: Payer: Self-pay

## 2021-10-26 DIAGNOSIS — I9589 Other hypotension: Secondary | ICD-10-CM | POA: Diagnosis not present

## 2021-10-26 DIAGNOSIS — R42 Dizziness and giddiness: Secondary | ICD-10-CM | POA: Diagnosis not present

## 2021-10-26 DIAGNOSIS — Z6835 Body mass index (BMI) 35.0-35.9, adult: Secondary | ICD-10-CM

## 2021-10-26 DIAGNOSIS — K2101 Gastro-esophageal reflux disease with esophagitis, with bleeding: Secondary | ICD-10-CM | POA: Diagnosis not present

## 2021-10-26 DIAGNOSIS — R0609 Other forms of dyspnea: Secondary | ICD-10-CM | POA: Diagnosis not present

## 2021-10-26 DIAGNOSIS — I959 Hypotension, unspecified: Secondary | ICD-10-CM | POA: Diagnosis not present

## 2021-10-26 DIAGNOSIS — Z7982 Long term (current) use of aspirin: Secondary | ICD-10-CM

## 2021-10-26 DIAGNOSIS — K2100 Gastro-esophageal reflux disease with esophagitis, without bleeding: Secondary | ICD-10-CM | POA: Diagnosis present

## 2021-10-26 DIAGNOSIS — K269 Duodenal ulcer, unspecified as acute or chronic, without hemorrhage or perforation: Secondary | ICD-10-CM

## 2021-10-26 DIAGNOSIS — K922 Gastrointestinal hemorrhage, unspecified: Principal | ICD-10-CM | POA: Diagnosis present

## 2021-10-26 DIAGNOSIS — I1310 Hypertensive heart and chronic kidney disease without heart failure, with stage 1 through stage 4 chronic kidney disease, or unspecified chronic kidney disease: Secondary | ICD-10-CM | POA: Diagnosis present

## 2021-10-26 DIAGNOSIS — Z800 Family history of malignant neoplasm of digestive organs: Secondary | ICD-10-CM

## 2021-10-26 DIAGNOSIS — R7303 Prediabetes: Secondary | ICD-10-CM | POA: Diagnosis present

## 2021-10-26 DIAGNOSIS — K2211 Ulcer of esophagus with bleeding: Secondary | ICD-10-CM | POA: Diagnosis not present

## 2021-10-26 DIAGNOSIS — E871 Hypo-osmolality and hyponatremia: Secondary | ICD-10-CM | POA: Diagnosis present

## 2021-10-26 DIAGNOSIS — Z6372 Alcoholism and drug addiction in family: Secondary | ICD-10-CM

## 2021-10-26 DIAGNOSIS — M1990 Unspecified osteoarthritis, unspecified site: Secondary | ICD-10-CM | POA: Diagnosis not present

## 2021-10-26 DIAGNOSIS — H9190 Unspecified hearing loss, unspecified ear: Secondary | ICD-10-CM | POA: Diagnosis present

## 2021-10-26 DIAGNOSIS — K648 Other hemorrhoids: Secondary | ICD-10-CM | POA: Diagnosis present

## 2021-10-26 DIAGNOSIS — K5900 Constipation, unspecified: Secondary | ICD-10-CM | POA: Diagnosis present

## 2021-10-26 DIAGNOSIS — E861 Hypovolemia: Secondary | ICD-10-CM | POA: Diagnosis present

## 2021-10-26 DIAGNOSIS — Z8249 Family history of ischemic heart disease and other diseases of the circulatory system: Secondary | ICD-10-CM | POA: Diagnosis not present

## 2021-10-26 DIAGNOSIS — K319 Disease of stomach and duodenum, unspecified: Secondary | ICD-10-CM | POA: Diagnosis not present

## 2021-10-26 DIAGNOSIS — R627 Adult failure to thrive: Secondary | ICD-10-CM | POA: Diagnosis not present

## 2021-10-26 DIAGNOSIS — K3189 Other diseases of stomach and duodenum: Secondary | ICD-10-CM | POA: Diagnosis not present

## 2021-10-26 DIAGNOSIS — Z825 Family history of asthma and other chronic lower respiratory diseases: Secondary | ICD-10-CM | POA: Diagnosis not present

## 2021-10-26 DIAGNOSIS — K2091 Esophagitis, unspecified with bleeding: Secondary | ICD-10-CM | POA: Diagnosis not present

## 2021-10-26 DIAGNOSIS — M19011 Primary osteoarthritis, right shoulder: Secondary | ICD-10-CM | POA: Diagnosis not present

## 2021-10-26 DIAGNOSIS — K921 Melena: Secondary | ICD-10-CM | POA: Diagnosis not present

## 2021-10-26 DIAGNOSIS — E875 Hyperkalemia: Secondary | ICD-10-CM | POA: Diagnosis not present

## 2021-10-26 DIAGNOSIS — Z79899 Other long term (current) drug therapy: Secondary | ICD-10-CM

## 2021-10-26 DIAGNOSIS — E785 Hyperlipidemia, unspecified: Secondary | ICD-10-CM | POA: Diagnosis present

## 2021-10-26 DIAGNOSIS — E669 Obesity, unspecified: Secondary | ICD-10-CM | POA: Diagnosis present

## 2021-10-26 DIAGNOSIS — N3281 Overactive bladder: Secondary | ICD-10-CM | POA: Diagnosis present

## 2021-10-26 DIAGNOSIS — Z833 Family history of diabetes mellitus: Secondary | ICD-10-CM

## 2021-10-26 DIAGNOSIS — Z85828 Personal history of other malignant neoplasm of skin: Secondary | ICD-10-CM

## 2021-10-26 DIAGNOSIS — N1831 Chronic kidney disease, stage 3a: Secondary | ICD-10-CM | POA: Diagnosis present

## 2021-10-26 DIAGNOSIS — Z811 Family history of alcohol abuse and dependence: Secondary | ICD-10-CM

## 2021-10-26 DIAGNOSIS — K5790 Diverticulosis of intestine, part unspecified, without perforation or abscess without bleeding: Secondary | ICD-10-CM | POA: Diagnosis present

## 2021-10-26 DIAGNOSIS — N179 Acute kidney failure, unspecified: Secondary | ICD-10-CM

## 2021-10-26 DIAGNOSIS — D62 Acute posthemorrhagic anemia: Secondary | ICD-10-CM | POA: Diagnosis present

## 2021-10-26 DIAGNOSIS — K264 Chronic or unspecified duodenal ulcer with hemorrhage: Principal | ICD-10-CM | POA: Diagnosis present

## 2021-10-26 DIAGNOSIS — D72829 Elevated white blood cell count, unspecified: Secondary | ICD-10-CM | POA: Diagnosis present

## 2021-10-26 DIAGNOSIS — R0902 Hypoxemia: Secondary | ICD-10-CM | POA: Diagnosis not present

## 2021-10-26 DIAGNOSIS — Z96612 Presence of left artificial shoulder joint: Secondary | ICD-10-CM | POA: Diagnosis present

## 2021-10-26 DIAGNOSIS — K2951 Unspecified chronic gastritis with bleeding: Secondary | ICD-10-CM | POA: Diagnosis not present

## 2021-10-26 DIAGNOSIS — R600 Localized edema: Secondary | ICD-10-CM | POA: Diagnosis present

## 2021-10-26 DIAGNOSIS — R5381 Other malaise: Secondary | ICD-10-CM | POA: Diagnosis present

## 2021-10-26 DIAGNOSIS — I10 Essential (primary) hypertension: Secondary | ICD-10-CM | POA: Diagnosis not present

## 2021-10-26 DIAGNOSIS — Z86010 Personal history of colonic polyps: Secondary | ICD-10-CM

## 2021-10-26 HISTORY — DX: Gastrointestinal hemorrhage, unspecified: K92.2

## 2021-10-26 LAB — COMPREHENSIVE METABOLIC PANEL
ALT: 22 U/L (ref 0–44)
AST: 21 U/L (ref 15–41)
Albumin: 2.6 g/dL — ABNORMAL LOW (ref 3.5–5.0)
Alkaline Phosphatase: 47 U/L (ref 38–126)
Anion gap: 7 (ref 5–15)
BUN: 87 mg/dL — ABNORMAL HIGH (ref 8–23)
CO2: 23 mmol/L (ref 22–32)
Calcium: 7.9 mg/dL — ABNORMAL LOW (ref 8.9–10.3)
Chloride: 104 mmol/L (ref 98–111)
Creatinine, Ser: 1.61 mg/dL — ABNORMAL HIGH (ref 0.61–1.24)
GFR, Estimated: 43 mL/min — ABNORMAL LOW (ref >60)
Glucose, Bld: 136 mg/dL — ABNORMAL HIGH (ref 70–99)
Potassium: 5.2 mmol/L — ABNORMAL HIGH (ref 3.5–5.1)
Sodium: 134 mmol/L — ABNORMAL LOW (ref 135–145)
Total Bilirubin: 0.9 mg/dL (ref 0.3–1.2)
Total Protein: 5.1 g/dL — ABNORMAL LOW (ref 6.5–8.1)

## 2021-10-26 LAB — CBC WITH DIFFERENTIAL/PLATELET
Abs Immature Granulocytes: 0.67 10*3/uL — ABNORMAL HIGH (ref 0.00–0.07)
Basophils Absolute: 0 10*3/uL (ref 0.0–0.1)
Basophils Relative: 0 %
Eosinophils Absolute: 0 10*3/uL (ref 0.0–0.5)
Eosinophils Relative: 0 %
HCT: 25.1 % — ABNORMAL LOW (ref 39.0–52.0)
Hemoglobin: 8.1 g/dL — ABNORMAL LOW (ref 13.0–17.0)
Immature Granulocytes: 3 %
Lymphocytes Relative: 8 %
Lymphs Abs: 2.1 10*3/uL (ref 0.7–4.0)
MCH: 30.5 pg (ref 26.0–34.0)
MCHC: 32.3 g/dL (ref 30.0–36.0)
MCV: 94.4 fL (ref 80.0–100.0)
Monocytes Absolute: 1.3 10*3/uL — ABNORMAL HIGH (ref 0.1–1.0)
Monocytes Relative: 5 %
Neutro Abs: 21.7 10*3/uL — ABNORMAL HIGH (ref 1.7–7.7)
Neutrophils Relative %: 84 %
Platelets: 265 10*3/uL (ref 150–400)
RBC: 2.66 MIL/uL — ABNORMAL LOW (ref 4.22–5.81)
RDW: 12.4 % (ref 11.5–15.5)
WBC: 25.9 10*3/uL — ABNORMAL HIGH (ref 4.0–10.5)
nRBC: 0 % (ref 0.0–0.2)

## 2021-10-26 LAB — ABO/RH: ABO/RH(D): O NEG

## 2021-10-26 LAB — PREPARE RBC (CROSSMATCH)

## 2021-10-26 LAB — POC OCCULT BLOOD, ED: Fecal Occult Bld: POSITIVE — AB

## 2021-10-26 LAB — LACTIC ACID, PLASMA: Lactic Acid, Venous: 2 mmol/L (ref 0.5–1.9)

## 2021-10-26 LAB — TROPONIN I (HIGH SENSITIVITY): Troponin I (High Sensitivity): 7 ng/L (ref <18)

## 2021-10-26 MED ORDER — PANTOPRAZOLE SODIUM 40 MG IV SOLR
40.0000 mg | Freq: Once | INTRAVENOUS | Status: DC
Start: 2021-10-26 — End: 2021-10-26
  Filled 2021-10-26: qty 10

## 2021-10-26 MED ORDER — POLYETHYLENE GLYCOL 3350 17 G PO PACK: 17.0000 g | PACK | Freq: Every day | ORAL | Status: DC | PRN

## 2021-10-26 MED ORDER — SODIUM CHLORIDE 0.9 % IV SOLN: 10.0000 mL/h | Freq: Once | INTRAVENOUS | Status: DC

## 2021-10-26 MED ORDER — PANTOPRAZOLE 80MG IVPB - SIMPLE MED
80.0000 mg | Freq: Once | INTRAVENOUS | Status: AC
  Administered 2021-10-26: 80 mg via INTRAVENOUS
  Filled 2021-10-26: qty 80

## 2021-10-26 MED ORDER — MELATONIN 3 MG PO TABS
3.0000 mg | ORAL_TABLET | Freq: Every evening | ORAL | Status: DC | PRN
  Filled 2021-10-26 (×2): qty 1

## 2021-10-26 MED ORDER — SODIUM CHLORIDE 0.9 % IV SOLN
10.0000 mL/h | Freq: Once | INTRAVENOUS | Status: DC
Start: 2021-10-26 — End: 2021-10-30

## 2021-10-26 MED ORDER — SODIUM CHLORIDE 0.9 % IV BOLUS (SEPSIS)
1000.0000 mL | Freq: Once | INTRAVENOUS | Status: AC
  Administered 2021-10-26: 1000 mL via INTRAVENOUS

## 2021-10-26 MED ORDER — ACETAMINOPHEN 325 MG PO TABS
650.0000 mg | ORAL_TABLET | Freq: Four times a day (QID) | ORAL | Status: DC | PRN
  Administered 2021-10-27 – 2021-10-30 (×5): 650 mg via ORAL
  Filled 2021-10-26 (×5): qty 2

## 2021-10-26 MED ORDER — PANTOPRAZOLE INFUSION (NEW) - SIMPLE MED
8.0000 mg/h | INTRAVENOUS | Status: AC
  Administered 2021-10-26 – 2021-10-29 (×4): 8 mg/h via INTRAVENOUS
  Filled 2021-10-26: qty 80
  Filled 2021-10-26: qty 100
  Filled 2021-10-26: qty 80
  Filled 2021-10-26 (×2): qty 100
  Filled 2021-10-26: qty 80

## 2021-10-26 MED ORDER — OXYCODONE HCL 5 MG PO TABS
5.0000 mg | ORAL_TABLET | Freq: Four times a day (QID) | ORAL | Status: DC | PRN
  Administered 2021-10-28: 5 mg via ORAL
  Filled 2021-10-26: qty 1

## 2021-10-26 MED ORDER — PANTOPRAZOLE SODIUM 40 MG IV SOLR
40.0000 mg | Freq: Two times a day (BID) | INTRAVENOUS | Status: DC
  Administered 2021-10-26 – 2021-10-30 (×2): 40 mg via INTRAVENOUS
  Filled 2021-10-26: qty 10

## 2021-10-26 MED ORDER — SODIUM CHLORIDE 0.9 % IV SOLN
1000.0000 mL | INTRAVENOUS | Status: DC
Start: 2021-10-26 — End: 2021-10-29
  Administered 2021-10-26: 1000 mL via INTRAVENOUS
  Administered 2021-10-27 (×2): 500 mL via INTRAVENOUS
  Administered 2021-10-28: 1000 mL via INTRAVENOUS

## 2021-10-26 NOTE — H&P (Addendum)
?History and Physical ? ?Gregory Blevins OLM:[id number] DOB: 09-Jan-1941 DOA: 10/26/2021 ? ?Referring physician: Dr. Tomi Bamberger, [place]. ?PCP: Tammi Sou, MD  ?Outpatient Specialists: Orthopedic surgery ?Patient coming from: Home via EMS. ? ?Chief Complaint: Dark stools, lightheadedness and hypotension. ? ?HPI: Gregory Blevins is a 81 y.o. male with medical history significant for essential hypertension, hyperlipidemia, obesity, chronic lower extremity edema, recent admission for left shoulder surgery on ASA 81 mg since his operation as DVT prophylaxis, who presented to [facility] ED with complaints of dark stools of 1 and 1/2-day duration.  Associated with lightheadedness, dizziness, hypotension with systolic blood pressure in the 70s at home today.  EMS was activated.  Upon EMS arrival, patient was hypotensive.  He denies fevers or chills.  No shortness of breath or chest pain.  In the ED rectal exam showed dark stools with guaiac positive.  Lab studies remarkable for hemoglobin of 8.1 K from baseline of 13.3 K.  Lactic acid above normal 2.0.  EDP contacted PCCM who recommended 1 unit PRBC transfusion first and see if his blood pressure will improve.  Patient received 1 unit of PRBC with improvement of his SBP.  A second unit PRBC has been ordered to be transfused.  EDP contacted GI [place] who will see the patient in consultation in the morning.  Patient has been started on Protonix drip.  His renal insufficiency precludes the use of CT angio abdomen and pelvis.  TRH, hospitalist team, was asked to admit. ? ?ED Course:  Tmax 98.5.  BP 107/38 with MAP of 59.  Pulse 84, respiratory 18.  O2 saturation 98% on room air.  Lab studies remarkable for serum sodium 134, potassium 5.2, serum bicarb 23, glucose 136, BUN 87, creatinine 1.61 with baseline creatinine of 1.2.  Troponin 7.  Lactic acid 2.0.  WBC 25.9, hemoglobin 8.1 K, from baseline hemoglobin of 13.3 K.  Hematocrit 25.1.  Neutrophil count 21.7. ? ?Review of  Systems: ?Review of systems as noted in the HPI. All other systems reviewed and are negative. ? ? ?Past Medical History:  ?Diagnosis Date  ? Chronic left shoulder pain   ? 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 Diverticulosis 2013  ? noted on colonoscopy  ? Fatty liver 05/2012  ? u/s done for mild/persistent elevation of LFTs  ? Hay fever   ? no issues now-? out grown  ? Hearing impairment   ? 11/2015 Audiology eval= not yet ready for hearing aids ([facility])  ? History of adenomatous polyp of colon   ? On multiple colonoscopies: most recent colonoscopy 05/31/18-->adenoma x 2, repeat 3-5 ys is optional.  ? History of basal cell carcinoma of skin   ? HTN (hypertension)   ? Hyperlipidemia   ? Lumbar spondylosis 04/2013  ? 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  ? Microhematuria 09/2017  ? On dipstick only.  Urine micro 09/2017 and 04/2018-->no RBCs.  ? OAB (overactive bladder)   ? ditropan xl 10 ineffective--pt d/c'd this 09/2018  ? Obesity   ? Osteoarthritis   ? Hips (L>R on x-ray 04/2013), shoulders, and knees (hx of adhesive capsulitis of shoulder)  ? Prediabetes   ? A1c 6.1% 08/2019 at [facility]  ? Rectal leakage   ? ?Past Surgical History:  ?Procedure Laterality Date  ? COLONOSCOPY    ? 2008 w/polyps,  09-22-2011 w/polyps.  01/2015 tubular adenoma x 1.  05/31/18 colonoscopy w/ two tubular adenomas.  ? COLONOSCOPY W/ POLYPECTOMY  2008; 09/22/11;01/2015  ?  Tubular adenoma--recall 3 yrs per Dr. Louanne Belton at [place].  Then switched to Dr. Hilarie Fredrickson with [place].  ? POLYPECTOMY    ? REVERSE SHOULDER ARTHROPLASTY Left 10/17/2021  ? Procedure: LEFT REVERSE SHOULDER ARTHROPLASTY;  Surgeon: Meredith Pel, MD;  Location: [facility];  Service: Orthopedics;  Laterality: Left;  ? TONSILLECTOMY    ? UMBILICAL HERNIA REPAIR  2011  ? WISDOM TOOTH EXTRACTION Bilateral   ? ? ?Social History:  reports that he has never smoked. He has never used smokeless tobacco. He reports current alcohol use of about  1.0 standard drink per week. He reports that he does not use drugs. ? ? ?No Known Allergies ? ?Family History  ?Problem Relation Age of Onset  ? Hypertension Father   ? Alcoholism Father   ? Stomach cancer Father   ?     primary with mets to colon  ? Colon cancer Father   ? Dementia Mother   ? Asthma Brother   ? Diabetes Sister   ? Colon polyps Neg Hx   ? Esophageal cancer Neg Hx   ? Rectal cancer Neg Hx   ?  ? ? ?Prior to Admission medications   ?Medication Sig Start Date End Date Taking? Authorizing Provider  ?acetaminophen (TYLENOL) 500 MG tablet Take 500-1,000 mg by mouth in the morning and at bedtime.    [provider]  ?aspirin EC 81 MG EC tablet Take 1 tablet (81 mg total) by mouth daily. Swallow whole. 10/19/21   Magnant, Charles L, PA-C  ?atorvastatin (LIPITOR) 20 MG tablet Take 1 tablet (20 mg total) by mouth daily. 10/13/21   [name], [name], MD  ?cholecalciferol (VITAMIN D) 25 MCG (1000 UNIT) tablet Take 1,000 Units by mouth daily.    [provider]  ?furosemide (LASIX) 20 MG tablet 1 tab po every other day ?Patient taking differently: Take 20 mg by mouth every other day. 04/15/21   [name], [name], MD  ?lisinopril (ZESTRIL) 30 MG tablet Take 1 tablet (30 mg total) by mouth daily. 10/13/21   [name], [name], MD  ?meclizine (ANTIVERT) 25 MG tablet Take 1-2 tablets (25-50 mg total) by mouth 3 (three) times daily as needed for dizziness. 10/10/21   Molpus, John, MD  ?ondansetron (ZOFRAN-ODT) 8 MG disintegrating tablet Take 1 tablet (8 mg total) by mouth every 8 (eight) hours as needed for nausea or vomiting. 10/10/21   Molpus, John, MD  ?oxyCODONE (OXY IR/ROXICODONE) 5 MG immediate release tablet Take 1 tablet (5 mg total) by mouth every 4 (four) hours as needed for moderate pain (pain score 4-6). 10/18/21   Magnant, Gerrianne Scale, PA-C  ? ? ?Physical Exam: ?BP (!) 107/38   Pulse 84   Temp 98.5 ?F (36.9 ?C) (Oral)   Resp 18   Ht 5' 7"  (1.702 m)   Wt 102 kg   SpO2 97%   BMI 35.22 kg/m?   ? ?General: 81 y.o. year-old male well developed well nourished in no acute distress.  Alert and oriented x3. ?Cardiovascular: Regular rate and rhythm with no rubs or gallops.  No thyromegaly or JVD noted.  No lower extremity edema. 2/4 pulses in all 4 extremities. ?Respiratory: Clear to auscultation with no wheezes or rales. Good inspiratory effort. ?Abdomen: Soft obese non-tender nondistended with normal bowel sounds x4 quadrants. ?Muskuloskeletal: No cyanosis, clubbing or edema noted bilaterally ?Neuro: CN II-XII intact, strength, sensation, reflexes ?Skin: No ulcerative lesions noted or rashes ?Psychiatry: Judgement and insight appear normal. Mood is  appropriate for condition and setting ?   ?   ?   ?Labs on Admission:  ?Basic Metabolic Panel: ?Recent Labs  ?Lab 10/26/21 ?1650  ?NA 134*  ?K 5.2*  ?CL 104  ?CO2 23  ?GLUCOSE 136*  ?BUN 87*  ?CREATININE 1.61*  ?CALCIUM 7.9*  ? ?Liver Function Tests: ?Recent Labs  ?Lab 10/26/21 ?1650  ?AST 21  ?ALT 22  ?ALKPHOS 47  ?BILITOT 0.9  ?PROT 5.1*  ?ALBUMIN 2.6*  ? ?No results for input(s): LIPASE, AMYLASE in the last 168 hours. ?No results for input(s): AMMONIA in the last 168 hours. ?CBC: ?Recent Labs  ?Lab 10/26/21 ?1650  ?WBC 25.9*  ?NEUTROABS 21.7*  ?HGB 8.1*  ?HCT 25.1*  ?MCV 94.4  ?PLT 265  ? ?Cardiac Enzymes: ?No results for input(s): CKTOTAL, CKMB, CKMBINDEX, TROPONINI in the last 168 hours. ? ?BNP (last 3 results) ?No results for input(s): BNP in the last 8760 hours. ? ?ProBNP (last 3 results) ?No results for input(s): PROBNP in the last 8760 hours. ? ?CBG: ?No results for input(s): GLUCAP in the last 168 hours. ? ?Radiological Exams on Admission: ?DG Chest Portable 1 View ? ?Result Date: 10/26/2021 ?CLINICAL DATA:  Constipation and dark stool. EXAM: PORTABLE CHEST 1 VIEW COMPARISON:  None. FINDINGS: The heart size and mediastinal contours are within normal limits. Low lung volumes are noted with mild left infrahilar scarring and/or atelectasis. There is no  evidence of a pleural effusion or pneumothorax. Marked severity degenerative changes seen involving the right shoulder. A radiopaque left shoulder replacement is seen. IMPRESSION: Low lung volumes with mild left infrahilar sca

## 2021-10-26 NOTE — Telephone Encounter (Signed)
Sent as FYI ? ?October 26, 2021 ?Gregory Blevins, RT ?Note 2:09 PM ?Patient's wife called triage line in regards to his blood pressure. She states that the physical therapist had noted patient's blood pressure was low when he was there previously. Today, patient has complaint of feeling dizzy. Blood pressure was checked by wife at 10:30am and was 97/55 in the right arm. At 1:30pm it was checked again and was 98/41.  She called Gregory Blevins, who told her that this may be due to dehydration, etc and that she needed to reach out to our office. Patient is trying to eat some today and is having bowel movements. After speaking with Gregory Blevins, he advised patient should be evaluated by his primary care physician in case something else seems to be going on. He spoke with patient's wife last night and patient is moving his bowels, passing gas, etc. ?  ?Patient's wife will reach out to PCP to try and schedule appointment. She will call back if further questions/concerns.  ?  ? ?[facility] - Client ?TELEPHONE ADVICE RECORD ?[facility]? ?Patient ?Name: ?Gregory Blevins ?Gender: Male ?DOB: [birth year] ?Age: 81 Y 80 M 8 D ?Return ?Phone ?Number: ?[phone] ?(Primary) ?Address: ?City/ ?State/ ?Zip: ?[place] ? [address] ?Client [facility] - Client ?Client Site [facility] - Day ?Provider Gregory Blevins - MD ?Contact Type Call ?Who Is Calling Patient / Member / Family / Caregiver ?Call Type Triage / Clinical ?Caller Name Gregory Blevins ?Relationship To Patient Spouse ?Return Phone Number [phone] (Primary) ?Chief Complaint Blood Pressure Low ?Reason for Call Symptomatic / Request for Health Information ?Initial Comment Caller states her husband is recovering from a ?surgical procedure on his shoulder, his blood ?pressure was 98/41 about 30 minutes ago. He is ?also experiencing dizziness. ?Translation No ?Nurse Assessment ?Nurse: Gregory Ranger RN, Gregory Blevins Date/Time (Eastern Time):  10/26/2021 2:18:21 PM ?Confirm and document reason for call. If ?symptomatic, describe symptoms. ?---Caller states her husband is recovering from a ?surgical procedure on his shoulder on 10/17/21, his ?blood pressure was 98/41 about 30 minutes ago. He is ?also experiencing dizziness. ?Does the patient have any new or worsening ?symptoms? ---Yes ?Will a triage be completed? ---Yes ?Related visit to physician within the last 2 weeks? ---Yes ?Does the PT have any chronic conditions? (i.e. ?diabetes, asthma, this includes High risk factors for ?pregnancy, etc.) ?---Yes ?List chronic conditions. ---HTN and takes BP meds in which he has had this ?am. ?Is this a behavioral health or substance abuse call? ---No ?Guidelines ?Guideline Title Affirmed Question Affirmed Notes Nurse Date/Time (Eastern ?Time) ?Blood Pressure - Low Major surgery in the ?past month ?Gregory Ranger, RN, Gregory Blevins 10/26/2021 2:19:42 ?PM ?Disp. Time (Eastern ?Time) Disposition Final User ?PLEASE NOTE: All timestamps contained within this report are represented as [place] Standard Time. ?CONFIDENTIALTY NOTICE: This fax transmission is intended only for the addressee. It contains information that is legally privileged, confidential or ?otherwise protected from use or disclosure. If you are not the intended recipient, you are strictly prohibited from reviewing, disclosing, copying using ?or disseminating any of this information or taking any action in reliance on or regarding this information. If you have received this fax in error, please ?notify us immediately by telephone so that we can arrange for its return to [place]. Phone: [phone], Toll-Free: [phone], Fax: [phone] ?Page: 2 of 2 ?Call Id: [id number] ?10/26/2021 2:24:08 PM Go to ED Now Yes Carmon, RN, Gregory Blevins ?Caller Disagree/Comply Comply ?Caller Understands Yes ?PreDisposition  Call Doctor ?Care Advice Given Per Guideline ?GO TO ED NOW: NOTE TO TRIAGER - DRIVING: * Another adult should drive. * If  immediate transportation is not available ?via car, rideshare (e.g., Lyft, Uber), or taxi, then the patient should be instructed to call [id number]. BRING MEDICINES: * Bring ?a list of your current medicines when you go to the Emergency Department (ER). CARE ADVICE given per Low Blood Pressure ?(Adult) guideline. * Bring the pill bottles too. This will help the doctor (or NP/PA) to make certain you are taking the right medicines ?and the right dose. ?Comments ?User: Gregory Apple, RN Date/Time Gregory Blevins Time): 10/26/2021 2:23:39 PM ?Advised do not give further heart/bp meds until further orders. ?Referrals ?[facility] - ED ?

## 2021-10-26 NOTE — ED Provider Notes (Signed)
?[facility] ?Provider Note ? ? ?CSN: [id number] ?Arrival date & time: 10/26/21  1601 ? ?  ? ?History ? ?Chief Complaint  ?Patient presents with  ? Hypotension  ? ? ?Gregory Blevins is a 81 y.o. male. ? ?HPI ? ?Patient has history of hypertension, osteoarthritis, hyperlipidemia, diverticulosis, obesity and recent admission to the hospital on April 10 for shoulder surgery.  Patient had a left reverse shoulder arthroplasty by Dr. Marlou Sa.  Patient states he has continued to have some trouble with shoulder pain since leaving the hospital however today he noticed that he was having issues with his blood pressure being very low.  He was also feeling dizzy and lightheaded.  Family checked his blood pressure at home and it was 97/55.  I was able to review outpatient telephone notes from the patient's primary care doctor's office today.  Patient also started to notice that his stools were dark today.  He was instructed to come to the emergency room.  Patient's denying any fevers.  He is not have any chest pain.  He denies feeling short of breath. ? ?Home Medications ?Prior to Admission medications   ?Medication Sig Start Date End Date Taking? Authorizing Provider  ?acetaminophen (TYLENOL) 500 MG tablet Take 500-1,000 mg by mouth in the morning and at bedtime.    [provider]  ?aspirin EC 81 MG EC tablet Take 1 tablet (81 mg total) by mouth daily. Swallow whole. 10/19/21   Magnant, Charles L, PA-C  ?atorvastatin (LIPITOR) 20 MG tablet Take 1 tablet (20 mg total) by mouth daily. 10/13/21   [name], [name], MD  ?cholecalciferol (VITAMIN D) 25 MCG (1000 UNIT) tablet Take 1,000 Units by mouth daily.    [provider]  ?furosemide (LASIX) 20 MG tablet 1 tab po every other day ?Patient taking differently: Take 20 mg by mouth every other day. 04/15/21   [name], [name], MD  ?lisinopril (ZESTRIL) 30 MG tablet Take 1 tablet (30 mg total) by mouth daily. 10/13/21   [name],  [name], MD  ?meclizine (ANTIVERT) 25 MG tablet Take 1-2 tablets (25-50 mg total) by mouth 3 (three) times daily as needed for dizziness. 10/10/21   Molpus, John, MD  ?ondansetron (ZOFRAN-ODT) 8 MG disintegrating tablet Take 1 tablet (8 mg total) by mouth every 8 (eight) hours as needed for nausea or vomiting. 10/10/21   Molpus, John, MD  ?oxyCODONE (OXY IR/ROXICODONE) 5 MG immediate release tablet Take 1 tablet (5 mg total) by mouth every 4 (four) hours as needed for moderate pain (pain score 4-6). 10/18/21   Magnant, Gerrianne Scale, PA-C  ?   ? ?Allergies    ?Patient has no known allergies.   ? ?Review of Systems   ?Review of Systems  ?Constitutional:  Negative for fever.  ? ?Physical Exam ?Updated Vital Signs ?BP (!) 105/45   Pulse 85   Temp 98.6 ?F (37 ?C)   Resp (!) 22   Ht 1.702 m (5' 7" )   Wt 102 kg   SpO2 99%   BMI 35.22 kg/m?  ?Physical Exam ?Vitals and nursing note reviewed.  ?Constitutional:   ?   Appearance: He is well-developed. He is not diaphoretic.  ?HENT:  ?   Head: Normocephalic and atraumatic.  ?   Right Ear: External ear normal.  ?   Left Ear: External ear normal.  ?Eyes:  ?   General: No scleral icterus.    ?   Right eye: No discharge.     ?  Left eye: No discharge.  ?   Conjunctiva/sclera: Conjunctivae normal.  ?Neck:  ?   Trachea: No tracheal deviation.  ?Cardiovascular:  ?   Rate and Rhythm: Normal rate and regular rhythm.  ?Pulmonary:  ?   Effort: Pulmonary effort is normal. No respiratory distress.  ?   Breath sounds: Normal breath sounds. No stridor. No wheezing or rales.  ?Abdominal:  ?   General: Bowel sounds are normal. There is no distension.  ?   Palpations: Abdomen is soft.  ?   Tenderness: There is no abdominal tenderness. There is no guarding or rebound.  ?Genitourinary: ?   Rectum: No mass.  ?   Comments: Stool black in color ?Musculoskeletal:     ?   General: No tenderness or deformity.  ?   Cervical back: Neck supple.  ?   Comments: Left shoulder in a sling, extensive bruising  left chest wall  ?Skin: ?   General: Skin is warm and dry.  ?   Findings: No rash.  ?Neurological:  ?   General: No focal deficit present.  ?   Mental Status: He is alert.  ?   Cranial Nerves: No cranial nerve deficit (no facial droop, extraocular movements intact, no slurred speech).  ?   Sensory: No sensory deficit.  ?   Motor: No abnormal muscle tone or seizure activity.  ?   Coordination: Coordination normal.  ?Psychiatric:     ?   Mood and Affect: Mood normal.  ? ? ?ED Results / Procedures / Treatments   ?Labs ?(all labs ordered are listed, but only abnormal results are displayed) ?Labs Reviewed  ?COMPREHENSIVE METABOLIC PANEL - Abnormal; Notable for the following components:  ?    Result Value  ? Sodium 134 (*)   ? Potassium 5.2 (*)   ? Glucose, Bld 136 (*)   ? BUN 87 (*)   ? Creatinine, Ser 1.61 (*)   ? Calcium 7.9 (*)   ? Total Protein 5.1 (*)   ? Albumin 2.6 (*)   ? GFR, Estimated 43 (*)   ? All other components within normal limits  ?CBC WITH DIFFERENTIAL/PLATELET - Abnormal; Notable for the following components:  ? WBC 25.9 (*)   ? RBC 2.66 (*)   ? Hemoglobin 8.1 (*)   ? HCT 25.1 (*)   ? Neutro Abs 21.7 (*)   ? Monocytes Absolute 1.3 (*)   ? Abs Immature Granulocytes 0.67 (*)   ? All other components within normal limits  ?LACTIC ACID, PLASMA - Abnormal; Notable for the following components:  ? Lactic Acid, Venous 2.0 (*)   ? All other components within normal limits  ?POC OCCULT BLOOD, ED - Abnormal; Notable for the following components:  ? Fecal Occult Bld POSITIVE (*)   ? All other components within normal limits  ?LACTIC ACID, PLASMA  ?HEMOGLOBIN AND HEMATOCRIT, BLOOD  ?HEMOGLOBIN AND HEMATOCRIT, BLOOD  ?CBC WITH DIFFERENTIAL/PLATELET  ?COMPREHENSIVE METABOLIC PANEL  ?MAGNESIUM  ?PHOSPHORUS  ?TYPE AND SCREEN  ?PREPARE RBC (CROSSMATCH)  ?ABO/RH  ?PREPARE RBC (CROSSMATCH)  ?TROPONIN I (HIGH SENSITIVITY)  ?TROPONIN I (HIGH SENSITIVITY)  ? ? ?EKG ?EKG Interpretation ? ?Date/Time:  Wednesday October 26 2021  16:06:34 EDT ?Ventricular Rate:  89 ?PR Interval:  146 ?QRS Duration: 78 ?QT Interval:  337 ?QTC Calculation: 410 ?R Axis:   43 ?Text Interpretation: Sinus rhythm Low voltage, precordial leads Abnormal R-wave progression, early transition No significant change since last tracing Confirmed by Dorie Rank [phone]) on 10/26/2021 4:43:23  PM ? ?Radiology ?DG Chest Portable 1 View ? ?Result Date: 10/26/2021 ?CLINICAL DATA:  Constipation and dark stool. EXAM: PORTABLE CHEST 1 VIEW COMPARISON:  None. FINDINGS: The heart size and mediastinal contours are within normal limits. Low lung volumes are noted with mild left infrahilar scarring and/or atelectasis. There is no evidence of a pleural effusion or pneumothorax. Marked severity degenerative changes seen involving the right shoulder. A radiopaque left shoulder replacement is seen. IMPRESSION: Low lung volumes with mild left infrahilar scarring and/or atelectasis. Electronically Signed   By: Virgina Norfolk M.D.   On: 10/26/2021 17:09   ? ?Procedures ?Mar[facility] KitchenCritical Care ?Performed by: Dorie Rank, MD ?Authorized by: Dorie Rank, MD  ? ?Critical care provider statement:  ?  Critical care time (minutes):  30 ?  Critical care was time spent personally by me on the following activities:  Development of treatment plan with patient or surrogate, discussions with consultants, evaluation of patient's response to treatment, examination of patient, ordering and review of laboratory studies, ordering and review of radiographic studies, ordering and performing treatments and interventions, pulse oximetry, re-evaluation of patient's condition and review of old charts  ? ? ?Medications Ordered in ED ?Medications  ?sodium chloride 0.9 % bolus 1,000 mL (0 mLs Intravenous Stopped 10/26/21 1723)  ?  Followed by  ?0.9 %  sodium chloride infusion (1,000 mLs Intravenous New Bag/Given 10/26/21 1653)  ?pantoprozole (PROTONIX) 80 mg /NS 100 mL infusion (8 mg/hr Intravenous New Bag/Given 10/26/21 1713)   ?pantoprazole (PROTONIX) injection 40 mg (40 mg Intravenous Given 10/26/21 1648)  ?0.9 %  sodium chloride infusion (10 mL/hr Intravenous Not Given 10/26/21 1717)  ?0.9 %  sodium chloride infusion (10 mL/hr Intravenous Not

## 2021-10-26 NOTE — Telephone Encounter (Signed)
Patient's wife called triage line in regards to his blood pressure. She states that the physical therapist had noted patient's blood pressure was low when he was there previously. Today, patient has complaint of feeling dizzy. Blood pressure was checked by wife at 10:30am and was 97/55 in the right arm. At 1:30pm it was checked again and was 98/41.  She called Alvis Lemmings, who told her that this may be due to dehydration, etc and that she needed to reach out to our office. Patient is trying to eat some today and is having bowel movements. After speaking with Lurena Joiner, he advised patient should be evaluated by his primary care physician in case something else seems to be going on. He spoke with patient's wife last night and patient is moving his bowels, passing gas, etc. ? ?Patient's wife will reach out to PCP to try and schedule appointment. She will call back if further questions/concerns. ?

## 2021-10-26 NOTE — ED Notes (Signed)
Pt has had one large black stool. Pressures soft between 702/40s and 90s/60s. 1st unit PRBC infusing currently. RN at bedside. Blood bank reported shortage of O- blood, so the infusing product is O+. Pt is breathing easily with no symptoms of transfusion reaction. BP remains soft but HR 90 and regular, O2 100% on room air, lung sounds clear. Pt in no acute distress and denies needs. Wife at bedside at this time and call light in reach. ?

## 2021-10-26 NOTE — ED Triage Notes (Signed)
Pt BIB [facility] from home, called out for dark stool, constipation after recent oxy intake from shoulder surgery, decreased oral intake. Today had dizziness since 1030 and 76/40 BP for EMS, worse on standing. 12 lead NSR. 20 R hand, EMS gave 500cc bolus. BP improved to 115/87. 98% on RA. HR 90. GCS 15. ?

## 2021-10-26 NOTE — Telephone Encounter (Signed)
tyvm

## 2021-10-27 ENCOUNTER — Inpatient Hospital Stay (HOSPITAL_COMMUNITY): Payer: Medicare Other

## 2021-10-27 ENCOUNTER — Encounter (HOSPITAL_COMMUNITY): Payer: Self-pay | Admitting: Internal Medicine

## 2021-10-27 ENCOUNTER — Telehealth: Payer: Self-pay | Admitting: Orthopedic Surgery

## 2021-10-27 ENCOUNTER — Encounter (HOSPITAL_COMMUNITY)
Admission: EM | Disposition: A | Discharge status: Home Health | Payer: Self-pay | Source: Home / Self Care | Attending: Internal Medicine

## 2021-10-27 ENCOUNTER — Inpatient Hospital Stay (HOSPITAL_COMMUNITY): Payer: Medicare Other | Admitting: Anesthesiology

## 2021-10-27 ENCOUNTER — Other Ambulatory Visit: Payer: Self-pay | Admitting: Family Medicine

## 2021-10-27 DIAGNOSIS — K921 Melena: Secondary | ICD-10-CM | POA: Diagnosis not present

## 2021-10-27 DIAGNOSIS — I10 Essential (primary) hypertension: Secondary | ICD-10-CM | POA: Diagnosis not present

## 2021-10-27 DIAGNOSIS — N179 Acute kidney failure, unspecified: Secondary | ICD-10-CM

## 2021-10-27 DIAGNOSIS — K269 Duodenal ulcer, unspecified as acute or chronic, without hemorrhage or perforation: Secondary | ICD-10-CM

## 2021-10-27 DIAGNOSIS — R0609 Other forms of dyspnea: Secondary | ICD-10-CM | POA: Diagnosis not present

## 2021-10-27 DIAGNOSIS — D62 Acute posthemorrhagic anemia: Secondary | ICD-10-CM

## 2021-10-27 DIAGNOSIS — M1990 Unspecified osteoarthritis, unspecified site: Secondary | ICD-10-CM

## 2021-10-27 DIAGNOSIS — K264 Chronic or unspecified duodenal ulcer with hemorrhage: Secondary | ICD-10-CM

## 2021-10-27 DIAGNOSIS — K2100 Gastro-esophageal reflux disease with esophagitis, without bleeding: Secondary | ICD-10-CM

## 2021-10-27 DIAGNOSIS — K2101 Gastro-esophageal reflux disease with esophagitis, with bleeding: Secondary | ICD-10-CM

## 2021-10-27 HISTORY — PX: BIOPSY: SHX5522

## 2021-10-27 HISTORY — PX: ESOPHAGOGASTRODUODENOSCOPY (EGD) WITH PROPOFOL: SHX5813

## 2021-10-27 HISTORY — PX: ESOPHAGOGASTRODUODENOSCOPY: SHX1529

## 2021-10-27 HISTORY — PX: TRANSTHORACIC ECHOCARDIOGRAM: SHX275

## 2021-10-27 LAB — CBC WITH DIFFERENTIAL/PLATELET
Abs Immature Granulocytes: 0.3 10*3/uL — ABNORMAL HIGH (ref 0.00–0.07)
Basophils Absolute: 0 10*3/uL (ref 0.0–0.1)
Basophils Relative: 0 %
Eosinophils Absolute: 0 10*3/uL (ref 0.0–0.5)
Eosinophils Relative: 0 %
HCT: 28.5 % — ABNORMAL LOW (ref 39.0–52.0)
Hemoglobin: 9 g/dL — ABNORMAL LOW (ref 13.0–17.0)
Immature Granulocytes: 2 %
Lymphocytes Relative: 13 %
Lymphs Abs: 2.4 10*3/uL (ref 0.7–4.0)
MCH: 30.2 pg (ref 26.0–34.0)
MCHC: 31.6 g/dL (ref 30.0–36.0)
MCV: 95.6 fL (ref 80.0–100.0)
Monocytes Absolute: 0.9 10*3/uL (ref 0.1–1.0)
Monocytes Relative: 5 %
Neutro Abs: 14.8 10*3/uL — ABNORMAL HIGH (ref 1.7–7.7)
Neutrophils Relative %: 80 %
Platelets: 114 10*3/uL — ABNORMAL LOW (ref 150–400)
RBC: 2.98 MIL/uL — ABNORMAL LOW (ref 4.22–5.81)
RDW: 13.2 % (ref 11.5–15.5)
WBC: 18.5 10*3/uL — ABNORMAL HIGH (ref 4.0–10.5)
nRBC: 0 % (ref 0.0–0.2)

## 2021-10-27 LAB — BASIC METABOLIC PANEL
Anion gap: 6 (ref 5–15)
BUN: 84 mg/dL — ABNORMAL HIGH (ref 8–23)
CO2: 18 mmol/L — ABNORMAL LOW (ref 22–32)
Calcium: 7.5 mg/dL — ABNORMAL LOW (ref 8.9–10.3)
Chloride: 111 mmol/L (ref 98–111)
Creatinine, Ser: 1.44 mg/dL — ABNORMAL HIGH (ref 0.61–1.24)
GFR, Estimated: 49 mL/min — ABNORMAL LOW (ref >60)
Glucose, Bld: 111 mg/dL — ABNORMAL HIGH (ref 70–99)
Potassium: 4.8 mmol/L (ref 3.5–5.1)
Sodium: 135 mmol/L (ref 135–145)

## 2021-10-27 LAB — CBC
HCT: 28.5 % — ABNORMAL LOW (ref 39.0–52.0)
HCT: 26.8 % — ABNORMAL LOW (ref 39.0–52.0)
Hemoglobin: 9.3 g/dL — ABNORMAL LOW (ref 13.0–17.0)
Hemoglobin: 8.8 g/dL — ABNORMAL LOW (ref 13.0–17.0)
MCH: 30.6 pg (ref 26.0–34.0)
MCH: 30.8 pg (ref 26.0–34.0)
MCHC: 32.6 g/dL (ref 30.0–36.0)
MCHC: 32.8 g/dL (ref 30.0–36.0)
MCV: 93.8 fL (ref 80.0–100.0)
MCV: 93.7 fL (ref 80.0–100.0)
Platelets: 178 10*3/uL (ref 150–400)
Platelets: 190 10*3/uL (ref 150–400)
RBC: 3.04 MIL/uL — ABNORMAL LOW (ref 4.22–5.81)
RBC: 2.86 MIL/uL — ABNORMAL LOW (ref 4.22–5.81)
RDW: 13.3 % (ref 11.5–15.5)
RDW: 13.4 % (ref 11.5–15.5)
WBC: 18.9 10*3/uL — ABNORMAL HIGH (ref 4.0–10.5)
WBC: 18.2 10*3/uL — ABNORMAL HIGH (ref 4.0–10.5)
nRBC: 0 % (ref 0.0–0.2)
nRBC: 0 % (ref 0.0–0.2)

## 2021-10-27 LAB — COMPREHENSIVE METABOLIC PANEL
ALT: 19 U/L (ref 0–44)
AST: 19 U/L (ref 15–41)
Albumin: 2.3 g/dL — ABNORMAL LOW (ref 3.5–5.0)
Alkaline Phosphatase: 38 U/L (ref 38–126)
Anion gap: 6 (ref 5–15)
BUN: 85 mg/dL — ABNORMAL HIGH (ref 8–23)
CO2: 17 mmol/L — ABNORMAL LOW (ref 22–32)
Calcium: 7.3 mg/dL — ABNORMAL LOW (ref 8.9–10.3)
Chloride: 114 mmol/L — ABNORMAL HIGH (ref 98–111)
Creatinine, Ser: 1.4 mg/dL — ABNORMAL HIGH (ref 0.61–1.24)
GFR, Estimated: 51 mL/min — ABNORMAL LOW (ref >60)
Glucose, Bld: 108 mg/dL — ABNORMAL HIGH (ref 70–99)
Potassium: 4.8 mmol/L (ref 3.5–5.1)
Sodium: 137 mmol/L (ref 135–145)
Total Bilirubin: 0.8 mg/dL (ref 0.3–1.2)
Total Protein: 4.5 g/dL — ABNORMAL LOW (ref 6.5–8.1)

## 2021-10-27 LAB — TROPONIN I (HIGH SENSITIVITY): Troponin I (High Sensitivity): 9 ng/L (ref <18)

## 2021-10-27 LAB — ECHOCARDIOGRAM COMPLETE
AR max vel: 3.42 cm2
AV Area VTI: 3.76 cm2
AV Area mean vel: 3.17 cm2
AV Mean grad: 3 mmHg
AV Peak grad: 6.7 mmHg
Ao pk vel: 1.29 m/s
Area-P 1/2: 3.76 cm2
Height: 67 in
S' Lateral: 3.2 cm
Weight: 3597.91 oz

## 2021-10-27 LAB — HEMOGLOBIN AND HEMATOCRIT, BLOOD
HCT: 28.7 % — ABNORMAL LOW (ref 39.0–52.0)
Hemoglobin: 9 g/dL — ABNORMAL LOW (ref 13.0–17.0)

## 2021-10-27 LAB — PHOSPHORUS: Phosphorus: 2.9 mg/dL (ref 2.5–4.6)

## 2021-10-27 LAB — LACTIC ACID, PLASMA: Lactic Acid, Venous: 1 mmol/L (ref 0.5–1.9)

## 2021-10-27 LAB — MAGNESIUM: Magnesium: 2.1 mg/dL (ref 1.7–2.4)

## 2021-10-27 SURGERY — ESOPHAGOGASTRODUODENOSCOPY (EGD) WITH PROPOFOL
Anesthesia: Monitor Anesthesia Care

## 2021-10-27 MED ORDER — PERFLUTREN LIPID MICROSPHERE
1.0000 mL | INTRAVENOUS | Status: AC | PRN
  Administered 2021-10-27: 2 mL via INTRAVENOUS
  Filled 2021-10-27: qty 10

## 2021-10-27 MED ORDER — PROPOFOL 500 MG/50ML IV EMUL
INTRAVENOUS | Status: DC | PRN
Start: 2021-10-27 — End: 2021-10-27
  Administered 2021-10-27: 100 ug/kg/min via INTRAVENOUS

## 2021-10-27 MED ORDER — PROPOFOL 10 MG/ML IV BOLUS
INTRAVENOUS | Status: DC | PRN
  Administered 2021-10-27: 20 mg via INTRAVENOUS
  Administered 2021-10-27: 30 mg via INTRAVENOUS

## 2021-10-27 SURGICAL SUPPLY — 15 items

## 2021-10-27 NOTE — Telephone Encounter (Signed)
Thanks for update. Looks like he has upper GI bleed with melena that has developed in the last day. I will attach Dr Marlou Sa so he is aware

## 2021-10-27 NOTE — Op Note (Signed)
[facility] ?Patient Name: Gregory Blevins ?Procedure Date : 10/27/2021 ?MRN: [id number] ?Attending MD: Thornton Park MD, MD ?Date of Birth: 06/11/1941 ?CSN: [id number] ?Age: 81 ?Admit Type: Inpatient ?Procedure:                Upper GI endoscopy ?Indications:              Melena, Suspected upper gastrointestinal bleeding ?Providers:                Thornton Park MD, MD, Doristine Johns, RN,  ?                          Gloris Ham, Technician, [employer]  ?                          Technician, Technician ?Referring MD:              ?Medicines:                Monitored Anesthesia Care ?Complications:            No immediate complications. ?Estimated Blood Loss:     Estimated blood loss was minimal. ?Procedure:                Pre-Anesthesia Assessment: ?                          - Prior to the procedure, a History and Physical  ?                          was performed, and patient medications and  ?                          allergies were reviewed. The patient's tolerance of  ?                          previous anesthesia was also reviewed. The risks  ?                          and benefits of the procedure and the sedation  ?                          options and risks were discussed with the patient.  ?                          All questions were answered, and informed consent  ?                          was obtained. Prior Anticoagulants: The patient has  ?                          taken no previous anticoagulant or antiplatelet  ?                          agents except for aspirin. ASA Grade Assessment:  ?  III - A patient with severe systemic disease. After  ?                          reviewing the risks and benefits, the patient was  ?                          deemed in satisfactory condition to undergo the  ?                          procedure. ?                          After obtaining informed consent, the endoscope was  ?                          passed under  direct vision. Throughout the  ?                          procedure, the patient's blood pressure, pulse, and  ?                          oxygen saturations were monitored continuously. The  ?                          GIF-H190 ([id number]) Olympus endoscope was introduced  ?                          through the mouth, and advanced to the third part  ?                          of duodenum. The upper GI endoscopy was  ?                          accomplished without difficulty. The patient  ?                          tolerated the procedure well. ?Scope In: ?Scope Out: ?Findings: ?     LA Grade D (one or more mucosal breaks involving at least 75% of  ?     esophageal circumference) esophagitis with no bleeding was found in the  ?     distal esophagus. Biopsies were taken with a cold forceps for histology.  ?     Estimated blood loss was minimal. ?     The entire examined stomach was normal. Biopsies were taken from the  ?     antrum, body, and fundus with a cold forceps for histology. Estimated  ?     blood loss was minimal. ?     One large, non-bleeding deeply cratered duodenal ulcer with was found in  ?     the first portion of the duodenum. There was overlying pigment but no  ?     clot or visible vessel. There was no bleeding around the ulcer until I  ?     tried to clean the base to assess for stigmata associated with high risk  ?     of rebleeding. With scope trauma, there  was a little oozing that  ?     occurred on the proximal portion of the ulcer. The lesion was at least  ?     15 mm in largest dimension. ?Impression:               - LA Grade D reflux esophagitis with no bleeding.  ?                          Biopsied. ?                          - Normal stomach. Biopsied. ?                          - Non-bleeding, large, cratered duodenal ulcer with  ?                          a scant amount of pigment. No active bleeding,  ?                          visible vessel, or adherent clot. ?Recommendation:           -  Return patient to hospital ward for ongoing care. ?                          - Clear liquid diet. ?                          - Continue present medications including IV  ?                          pantoprazole for 72 hours, then convert to BID  ?                          dosing schedule. ?                          - Await pathology results. ?                          - Avoid NSAIDs as able. ?                          - Repeat upper endoscopy in 8 weeks to check  ?                          healing. ?Procedure Code(s):        --- Professional --- ?                          [phone], Esophagogastroduodenoscopy, flexible,  ?                          transoral; with biopsy, single or multiple ?Diagnosis Code(s):        --- Professional --- ?  K21.00, Gastro-esophageal reflux disease with  ?                          esophagitis, without bleeding ?                          K26.9, Duodenal ulcer, unspecified as acute or  ?                          chronic, without hemorrhage or perforation ?                          K92.1, Melena (includes Hematochezia) ?CPT copyright 2019 American Medical Association. All rights reserved. ?The codes documented in this report are preliminary and upon coder review may  ?be revised to meet current compliance requirements. ?Thornton Park MD, MD ?10/27/2021 2:04:44 PM ?This report has been signed electronically. ?Number of Addenda: 0 ?

## 2021-10-27 NOTE — Anesthesia Postprocedure Evaluation (Signed)
Anesthesia Post Note ? ?Patient: DAMEIR GENTZLER ? ?Procedure(s) Performed: ESOPHAGOGASTRODUODENOSCOPY (EGD) WITH PROPOFOL ?BIOPSY ? ?  ? ?Patient location during evaluation: PACU ?Anesthesia Type: MAC ?Level of consciousness: awake and alert ?Pain management: pain level controlled ?Vital Signs Assessment: post-procedure vital signs reviewed and stable ?Respiratory status: spontaneous breathing, nonlabored ventilation and respiratory function stable ?Cardiovascular status: stable and blood pressure returned to baseline ?Anesthetic complications: no ? ? ?No notable events documented. ? ?Last Vitals:  ?Vitals:  ? 10/27/21 1410 10/27/21 1420  ?BP: (!) 134/43 (!) 139/39  ?Pulse: 69 68  ?Resp: 10 12  ?Temp:    ?SpO2: 97% 96%  ?  ?Last Pain:  ?Vitals:  ? 10/27/21 1420  ?TempSrc:   ?PainSc: 0-No pain  ? ? ?  ?  ?  ?  ?  ?  ? ?Audry Pili ? ? ? ? ?

## 2021-10-27 NOTE — Progress Notes (Signed)
?      ?                 PROGRESS NOTE ? ?      ?PATIENT DETAILS ?Name: Gregory Blevins ?Age: 81 y.o. ?Sex: male ?Date of Birth: 04/28/1941 ?Admit Date: 10/26/2021 ?Admitting Physician Kayleen Memos, DO ?DJS:HFWYOVZ, [name], MD ? ?Brief Summary: ?Patient is a 81 y.o.  male with history of s/p shoulder replacement on 4/10, HTN, CKD stage IIIa, chronic lower extremity edema-who presented with upper GI bleeding with acute blood loss anemia. ? ? ?Significant events: ?4/19>> presented with melanotic stools and acute blood loss anemia-s/p 2 units of PRBC transfusion. ? ?Significant studies: ?4/19>> CXR: No PNA ? ?Significant microbiology data: ?None ? ?Procedures: ?None ? ?Consults: ?GI ? ?Subjective: ?Had black stools earlier this morning. ? ?Objective: ?Vitals: ?Blood pressure (!) 120/49, pulse 71, temperature 97.8 ?F (36.6 ?C), temperature source Oral, resp. rate 17, height 5' 7"  (1.702 m), weight 102 kg, SpO2 99 %.  ? ?Exam: ?Gen Exam:Alert awake-not in any distress ?HEENT:atraumatic, normocephalic ?Chest: B/L clear to auscultation anteriorly ?CVS:S1S2 regular ?Abdomen:soft non tender, non distended ?Extremities:+ edema ?Neurology: Non focal ?Skin: no rash ? ?Pertinent Labs/Radiology: ? ?  Latest Ref Rng & Units 10/27/2021  ?  1:10 AM 10/26/2021  ?  4:50 PM 10/18/2021  ?  6:18 AM  ?CBC  ?WBC 4.0 - 10.5 K/uL 18.5   25.9   17.7    ?Hemoglobin 13.0 - 17.0 g/dL 9.0    ? 9.0   8.1   13.3    ?Hematocrit 39.0 - 52.0 % 28.7    ? 28.5   25.1   41.1    ?Platelets 150 - 400 K/uL 114   265   159    ?  ?Lab Results  ?Component Value Date  ? NA 137 10/27/2021  ? NA 135 10/27/2021  ? K 4.8 10/27/2021  ? K 4.8 10/27/2021  ? CL 114 (H) 10/27/2021  ? CL 111 10/27/2021  ? CO2 17 (L) 10/27/2021  ? CO2 18 (L) 10/27/2021  ?  ? ? ?Assessment/Plan: ?Upper GI bleeding with acute blood loss anemia: Continues to have melanotic stools as of this morning-on PPI infusion-GI planning EGD later today.  Hemoglobin stable after 2 units of PRBC  transfusion-but will continue to follow CBC closely and transfuse accordingly. ? ?AKI on CKD stage IIIa: AKI hemodynamically mediated-mild-and slowly improving.  Follow. ? ?Mild hypotension: Due to hypovolemia-blood pressure better after IVF resuscitation.  Continue to hold all antihypertensives.  ? ?Leukocytosis: No sources of infection apparent-he does not have any signs symptoms consistent with an infection.  He however has not has his right shoulder wound open since surgery-have consulted orthopedics to ensure the wound is not infected.  Will order blood cultures. ? ?HTN: BP stabilizing-continue to hold all antihypertensives. ? ?HLD: Hold statins for now. ? ?Chronic lower extremity edema: Unclear etiology-UA negative for proteinuria-checking echo.  Furosemide on hold due to soft blood pressure. ? ?Recent left shoulder replacement: Have consulted orthopedics-see above. ? ?BMI: ?Estimated body mass index is 35.22 kg/m? as calculated from the following: ?  Height as of this encounter: 5' 7"  (1.702 m). ?  Weight as of this encounter: 102 kg.  ? ?Code status: ?  Code Status: Full Code  ? ?DVT Prophylaxis: ?SCDs Start: 10/26/21 2054 ?  ?Family Communication: Dione Booze [phone] ? ? ?Disposition Plan: ?Status is: Inpatient ?Remains inpatient appropriate because: Upper GI bleeding with acute blood loss anemia-ongoing  GI bleeding-requiring CBC monitoring-EGD scheduled for later today. ?  ?Planned Discharge Destination:Home ? ? ?Diet: ?Diet Order   ? ?       ?  Diet NPO time specified  Diet effective midnight       ?  ? ?  ?  ? ?  ?  ? ? ?Antimicrobial agents: ?Anti-infectives (From admission, onward)  ? ? None  ? ?  ? ? ? ?MEDICATIONS: ?Scheduled Meds: ? [START ON 10/30/2021] pantoprazole  40 mg Intravenous Q12H  ? ?Continuous Infusions: ? sodium chloride 1,000 mL (10/26/21 1653)  ? sodium chloride    ? sodium chloride    ? pantoprazole 8 mg/hr (10/26/21 1713)  ? ?PRN Meds:.acetaminophen, melatonin, oxyCODONE,  polyethylene glycol ? ? ?I have personally reviewed following labs and imaging studies ? ?LABORATORY DATA: ?CBC: ?Recent Labs  ?Lab 10/26/21 ?1650 10/27/21 ?0110  ?WBC 25.9* 18.5*  ?NEUTROABS 21.7* 14.8*  ?HGB 8.1* 9.0*  9.0*  ?HCT 25.1* 28.5*  28.7*  ?MCV 94.4 95.6  ?PLT 265 114*  ? ? ?Basic Metabolic Panel: ?Recent Labs  ?Lab 10/26/21 ?1650 10/27/21 ?0110  ?NA 134* 137  135  ?K 5.2* 4.8  4.8  ?CL 104 114*  111  ?CO2 23 17*  18*  ?GLUCOSE 136* 108*  111*  ?BUN 87* 85*  84*  ?CREATININE 1.61* 1.40*  1.44*  ?CALCIUM 7.9* 7.3*  7.5*  ?MG  --  2.1  ?PHOS  --  2.9  ? ? ?GFR: ?Estimated Creatinine Clearance: 47.9 mL/min (A) (by C-G formula based on SCr of 1.4 mg/dL (H)). ? ?Liver Function Tests: ?Recent Labs  ?Lab 10/26/21 ?1650 10/27/21 ?0110  ?AST 21 19  ?ALT 22 19  ?ALKPHOS 47 38  ?BILITOT 0.9 0.8  ?PROT 5.1* 4.5*  ?ALBUMIN 2.6* 2.3*  ? ?No results for input(s): LIPASE, AMYLASE in the last 168 hours. ?No results for input(s): AMMONIA in the last 168 hours. ? ?Coagulation Profile: ?No results for input(s): INR, PROTIME in the last 168 hours. ? ?Cardiac Enzymes: ?No results for input(s): CKTOTAL, CKMB, CKMBINDEX, TROPONINI in the last 168 hours. ? ?BNP (last 3 results) ?No results for input(s): PROBNP in the last 8760 hours. ? ?Lipid Profile: ?No results for input(s): CHOL, HDL, LDLCALC, TRIG, CHOLHDL, LDLDIRECT in the last 72 hours. ? ?Thyroid Function Tests: ?No results for input(s): TSH, T4TOTAL, FREET4, T3FREE, THYROIDAB in the last 72 hours. ? ?Anemia Panel: ?No results for input(s): VITAMINB12, FOLATE, FERRITIN, TIBC, IRON, RETICCTPCT in the last 72 hours. ? ?Urine analysis: ?   ?Component Value Date/Time  ? COLORURINE YELLOW 10/11/2021 1143  ? APPEARANCEUR CLEAR 10/11/2021 1143  ? LABSPEC 1.010 10/11/2021 1143  ? PHURINE 5.0 10/11/2021 1143  ? GLUCOSEU NEGATIVE 10/11/2021 1143  ? GLUCOSEU NEGATIVE 04/09/2018 0858  ? HGBUR NEGATIVE 10/11/2021 1143  ? BILIRUBINUR NEGATIVE 10/11/2021 1143  ? BILIRUBINUR  Negative 10/03/2017 1001  ? KETONESUR NEGATIVE 10/11/2021 1143  ? PROTEINUR NEGATIVE 10/11/2021 1143  ? UROBILINOGEN 0.2 04/09/2018 0858  ? NITRITE NEGATIVE 10/11/2021 1143  ? LEUKOCYTESUR NEGATIVE 10/11/2021 1143  ? ? ?Sepsis Labs: ?Lactic Acid, Venous ?   ?Component Value Date/Time  ? LATICACIDVEN 1.0 10/27/2021 0110  ? ? ?MICROBIOLOGY: ?No results found for this or any previous visit (from the past 240 hour(s)). ? ?RADIOLOGY STUDIES/RESULTS: ?DG Chest Portable 1 View ? ?Result Date: 10/26/2021 ?CLINICAL DATA:  Constipation and dark stool. EXAM: PORTABLE CHEST 1 VIEW COMPARISON:  None. FINDINGS: The heart size and mediastinal contours are within normal limits.  Low lung volumes are noted with mild left infrahilar scarring and/or atelectasis. There is no evidence of a pleural effusion or pneumothorax. Marked severity degenerative changes seen involving the right shoulder. A radiopaque left shoulder replacement is seen. IMPRESSION: Low lung volumes with mild left infrahilar scarring and/or atelectasis. Electronically Signed   By: Virgina Norfolk M.D.   On: 10/26/2021 17:09   ? ? LOS: 1 day  ? ?Oren Binet, MD  ?[facility] ? ? ? ?To contact the attending provider between 7A-7P or the covering provider during after hours 7P-7A, please log into the web site [email or web address] and access using universal [facility] password for that web site. If you do not have the password, please call the hospital operator. ? ?10/27/2021, 10:51 AM ? ? ? ?

## 2021-10-27 NOTE — Anesthesia Preprocedure Evaluation (Addendum)
Anesthesia Evaluation  ?Patient identified by MRN, date of birth, ID band ?Patient awake ? ? ? ?Reviewed: ?Allergy & Precautions, NPO status , Patient's Chart, lab work & pertinent test results ? ?History of Anesthesia Complications ?Negative for: history of anesthetic complications ? ?Airway ?Mallampati: IV ? ?TM Distance: >3 FB ?Neck ROM: Full ? ? ? Dental ? ?(+) Dental Advisory Given, Teeth Intact ?  ?Pulmonary ?neg pulmonary ROS,  ?  ?Pulmonary exam normal ? ? ? ? ? ? ? Cardiovascular ?hypertension, Pt. on medications ?Normal cardiovascular exam ? ? ?  ?Neuro/Psych ?negative neurological ROS ? negative psych ROS  ? GI/Hepatic ?negative GI ROS, Neg liver ROS,   ?Endo/Other  ? ?Obesity ?Pre-DM ? ? Renal/GU ?negative Renal ROS Bladder dysfunction ? ? ? ?  ?Musculoskeletal ? ?(+) Arthritis ,  ? Abdominal ?(+) + obese,   ?Peds ? Hematology ? ?(+) Blood dyscrasia, anemia ,  ?Plt 114k ?   ?Anesthesia Other Findings ? ? Reproductive/Obstetrics ? ?  ? ? ? ? ? ? ? ? ? ? ? ? ? ?  ?  ? ? ? ? ? ? ? ?Anesthesia Physical ?Anesthesia Plan ? ?ASA: 3 ? ?Anesthesia Plan: MAC  ? ?Post-op Pain Management:   ? ?Induction:  ? ?PONV Risk Score and Plan: 1 and Propofol infusion and Treatment may vary due to age or medical condition ? ?Airway Management Planned: Nasal Cannula and Natural Airway ? ?Additional Equipment: None ? ?Intra-op Plan:  ? ?Post-operative Plan:  ? ?Informed Consent: I have reviewed the patients History and Physical, chart, labs and discussed the procedure including the risks, benefits and alternatives for the proposed anesthesia with the patient or authorized representative who has indicated his/her understanding and acceptance.  ? ? ? ? ? ?Plan Discussed with: CRNA and Anesthesiologist ? ?Anesthesia Plan Comments:   ? ? ? ? ? ?Anesthesia Quick Evaluation ? ?

## 2021-10-27 NOTE — Anesthesia Procedure Notes (Signed)
Procedure Name: Adrian ?Date/Time: 10/27/2021 1:29 PM ?Performed by: Carolan Clines, CRNA ?Pre-anesthesia Checklist: Patient identified, Emergency Drugs available, Suction available and Patient being monitored ?Patient Re-evaluated:Patient Re-evaluated prior to induction ?Oxygen Delivery Method: Nasal cannula ?Dental Injury: Teeth and Oropharynx as per pre-operative assessment  ? ? ? ? ?

## 2021-10-27 NOTE — Progress Notes (Signed)
Echo attempted. Patient going to another procedure. Will attempt again as time permits. ?

## 2021-10-27 NOTE — Progress Notes (Signed)
?  Transition of Care (TOC) Screening Note ? ? ?Patient Details  ?Name: Gregory Blevins ?Date of Birth: 1941/07/10 ? ? ?Transition of Care (TOC) CM/SW Contact:    ?Cyndi Bender, RN ?Phone Number: ?10/27/2021, 3:54 PM ? ? ? ?Transition of Care Department [facility]) has reviewed patient and no TOC needs have been identified at this time. We will continue to monitor patient advancement through interdisciplinary progression rounds. If new patient transition needs arise, please place a TOC consult. ? ? ?

## 2021-10-27 NOTE — Transfer of Care (Signed)
Immediate Anesthesia Transfer of Care Note ? ?Patient: Gregory Blevins ? ?Procedure(s) Performed: ESOPHAGOGASTRODUODENOSCOPY (EGD) WITH PROPOFOL ?BIOPSY ? ?Patient Location: Endoscopy Unit ? ?Anesthesia Type:MAC ? ?Level of Consciousness: awake, alert  and oriented ? ?Airway & Oxygen Therapy: Patient Spontanous Breathing and Patient connected to nasal cannula oxygen ? ?Post-op Assessment: Report given to RN and Post -op Vital signs reviewed and stable ? ?Post vital signs: Reviewed and stable ? ?Last Vitals:  ?Vitals Value Taken Time  ?BP 109/34 10/27/21 1353  ?Temp 36.4 ?C 10/27/21 1353  ?Pulse 72 10/27/21 1353  ?Resp 16 10/27/21 1354  ?SpO2 100 % 10/27/21 1353  ?Vitals shown include unvalidated device data. ? ?Last Pain:  ?Vitals:  ? 10/27/21 1353  ?TempSrc: Temporal  ?PainSc: 0-No pain  ?   ? ?  ? ?Complications: No notable events documented. ?

## 2021-10-27 NOTE — Telephone Encounter (Signed)
Pt's wife calling to update to let Marlou Sa and Lurena Joiner know that pt was admitted to the hospital. Pt's wife said he does have a follow up sch'd for Tuesday and said she would give [place] a call to let us know if they can keep that appt or resch if he is still admitted.  ?

## 2021-10-27 NOTE — Consult Note (Signed)
? ?Consultation ? ?Referring Provider:  [facility]/ Ghimire ?Primary Care Physician:  Tammi Sou, MD ?Primary Gastroenterologist:  Dr.Pyrtle ? ?Reason for Consultation: GI bleed/melena ? ?HPI: Gregory Blevins is a 81 y.o. male, known to Dr. Hilarie Fredrickson from a prior colonoscopy done for history of adenomatous colon polyps.  Last exam was done November 2019 with removal of 6 polyps, largest 7 mm also noted multiple diverticuli and internal hemorrhoids.  Follow-up indicated in 3 to 5 years based on overall health due to age. ? ?Patient was admitted through the emergency room last evening after he presented with weakness, dizziness and low blood pressure at home.  Was 63/55 at home.  His wife also related that he had onset of dark stools yesterday.  He had been constipated after recent shoulder replacement on 10/17/2021.  Have been taking stool softeners and some laxatives.  He did have hard stool earlier in the week.  Apparently yesterday morning he had a "blowout" of stool which was all very dark, no gross blood.  He had a second episode of this and then they presented to the emergency room. ?He has not been feeling well at all since he underwent the shoulder surgery, has had absolutely no appetite and has not been eating or drinking much at all.  They had spoken with the orthopedic surgeon who told him to stop oxycodone which they did a week ago but his symptoms have not improved.  He has not had any documented fever or chills, no cough or shortness of breath, no dysuria.  He has no complaints of abdominal pain, no nausea or vomiting, no dysphagia or odynophagia.  He says he is not eating or drinking much just because he has absolutely no desire or appetite.  The best he has done in several days was drinking a glass of orange juice yesterday.  He was placed on baby aspirin 1 daily after surgery but has not been on any NSAIDs.  He did continue taking his usual medications including Zestril and Lasix. ? ?Still with blood  pressure around 100 in the ER ?Admitting labs-BC 25.9/hemoglobin 8.1/hematocrit 25.1--- Labs on 411 had shown WBC of 17.7, hemoglobin 13.3 hematocrit 41.1 ? ?Sodium 134/potassium 5.2/BUN 87/creatinine 1.61 up from his baseline of 1.3 ?FTs within normal limits ?Stool documented heme positive ?Lactate 2 ?Troponin negative ? ?Chest x-ray 1 view negative ? ?He was transfused 2 units of packed RBCs last evening, today hemoglobin 9/hematocrit 28.7 WBC 18.5/platelets 114 ? ?BUN 30/creatinine 1.4. ? ?Patient has no prior history of ulcers or GI bleeding, no prior EGD.  He says he had been taking Tylenol prior to surgery, has not been on any recent NSAIDs. ? ?Other medical problems include hypertension, hyperlipidemia, osteoarthritis, diverticulosis. ? ?He feels better since transfusions, was up at bedside commode when I came in the room, denied any dizziness or lightheadedness ?He did have a very small amount of melenic stool ? ?Past Medical History:  ?Diagnosis Date  ? Chronic left shoulder pain   ? 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 Diverticulosis 2013  ? noted on colonoscopy  ? Fatty liver 05/2012  ? u/s done for mild/persistent elevation of LFTs  ? Hay fever   ? no issues now-? out grown  ? Hearing impairment   ? 11/2015 Audiology eval= not yet ready for hearing aids ([facility])  ? History of adenomatous polyp of colon   ? On multiple colonoscopies: most recent colonoscopy 05/31/18-->adenoma x 2, repeat 3-5 ys  is optional.  ? History of basal cell carcinoma of skin   ? HTN (hypertension)   ? Hyperlipidemia   ? Lumbar spondylosis 04/2013  ? 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  ? Microhematuria 09/2017  ? On dipstick only.  Urine micro 09/2017 and 04/2018-->no RBCs.  ? OAB (overactive bladder)   ? ditropan xl 10 ineffective--pt d/c'd this 09/2018  ? Obesity   ? Osteoarthritis   ? Hips (L>R on x-ray 04/2013), shoulders, and knees (hx of adhesive capsulitis of shoulder)  ? Prediabetes   ? A1c  6.1% 08/2019 at [facility]  ? Rectal leakage   ? ? ?Past Surgical History:  ?Procedure Laterality Date  ? COLONOSCOPY    ? 2008 w/polyps,  09-22-2011 w/polyps.  01/2015 tubular adenoma x 1.  05/31/18 colonoscopy w/ two tubular adenomas.  ? COLONOSCOPY W/ POLYPECTOMY  2008; 09/22/11;01/2015  ? Tubular adenoma--recall 3 yrs per Dr. Louanne Belton at [place].  Then switched to Dr. Hilarie Fredrickson with [facility].  ? POLYPECTOMY    ? REVERSE SHOULDER ARTHROPLASTY Left 10/17/2021  ? Procedure: LEFT REVERSE SHOULDER ARTHROPLASTY;  Surgeon: Meredith Pel, MD;  Location: [place];  Service: Orthopedics;  Laterality: Left;  ? TONSILLECTOMY    ? UMBILICAL HERNIA REPAIR  2011  ? WISDOM TOOTH EXTRACTION Bilateral   ? ? ?Prior to Admission medications   ?Medication Sig Start Date End Date Taking? Authorizing Provider  ?acetaminophen (TYLENOL) 500 MG tablet Take 500-1,000 mg by mouth in the morning and at bedtime.   Yes [provider]  ?aspirin EC 81 MG EC tablet Take 1 tablet (81 mg total) by mouth daily. Swallow whole. 10/19/21  Yes Magnant, Charles L, PA-C  ?atorvastatin (LIPITOR) 20 MG tablet Take 1 tablet (20 mg total) by mouth daily. 10/13/21  Yes [name], [name], MD  ?cholecalciferol (VITAMIN D) 25 MCG (1000 UNIT) tablet Take 1,000 Units by mouth daily.   Yes [provider]  ?furosemide (LASIX) 20 MG tablet 1 tab po every other day ?Patient taking differently: Take 20 mg by mouth every other day. 04/15/21  Yes [name], [name], MD  ?lisinopril (ZESTRIL) 30 MG tablet Take 1 tablet (30 mg total) by mouth daily. 10/13/21  Yes [name], [name], MD  ?meclizine (ANTIVERT) 25 MG tablet Take 1-2 tablets (25-50 mg total) by mouth 3 (three) times daily as needed for dizziness. 10/10/21  Yes Molpus, John, MD  ?ondansetron (ZOFRAN-ODT) 8 MG disintegrating tablet Take 1 tablet (8 mg total) by mouth every 8 (eight) hours as needed for nausea or vomiting. 10/10/21  Yes Molpus, John, MD  ?oxyCODONE (OXY IR/ROXICODONE) 5 MG  immediate release tablet Take 1 tablet (5 mg total) by mouth every 4 (four) hours as needed for moderate pain (pain score 4-6). 10/18/21  Yes Magnant, Gerrianne Scale, PA-C  ?senna (SENOKOT) 8.6 MG TABS tablet Take 1 tablet by mouth daily as needed for mild constipation or moderate constipation.   Yes [provider]  ? ? ?Current Facility-Administered Medications  ?Medication Dose Route Frequency Provider Last Rate Last Admin  ? 0.9 %  sodium chloride infusion  1,000 mL Intravenous Continuous Dorie Rank, MD 125 mL/hr at 10/26/21 1653 1,000 mL at 10/26/21 1653  ? 0.9 %  sodium chloride infusion  10 mL/hr Intravenous Once Dorie Rank, MD      ? 0.9 %  sodium chloride infusion  10 mL/hr Intravenous Once Dorie Rank, MD      ? acetaminophen (TYLENOL) tablet 650 mg  650 mg Oral Q6H PRN Irene Pap N, DO      ? melatonin tablet 3 mg  3 mg Oral QHS PRN Irene Pap N, DO      ? oxyCODONE (Oxy IR/ROXICODONE) immediate release tablet 5 mg  5 mg Oral Q6H PRN Irene Pap N, DO      ? [START ON 10/30/2021] pantoprazole (PROTONIX) injection 40 mg  40 mg Intravenous Raul Del, MD   40 mg at 10/26/21 1648  ? pantoprozole (PROTONIX) 80 mg /NS 100 mL infusion  8 mg/hr Intravenous Continuous Dorie Rank, MD 10 mL/hr at 10/26/21 1713 8 mg/hr at 10/26/21 1713  ? polyethylene glycol (MIRALAX / GLYCOLAX) packet 17 g  17 g Oral Daily PRN Kayleen Memos, DO      ? ?Current Outpatient Medications  ?Medication Sig Dispense Refill  ? acetaminophen (TYLENOL) 500 MG tablet Take 500-1,000 mg by mouth in the morning and at bedtime.    ? aspirin EC 81 MG EC tablet Take 1 tablet (81 mg total) by mouth daily. Swallow whole. 30 tablet 0  ? atorvastatin (LIPITOR) 20 MG tablet Take 1 tablet (20 mg total) by mouth daily. 90 tablet 1  ? cholecalciferol (VITAMIN D) 25 MCG (1000 UNIT) tablet Take 1,000 Units by mouth daily.    ? furosemide (LASIX) 20 MG tablet 1 tab po every other day (Patient taking differently: Take 20 mg by mouth every other  day.) 45 tablet 3  ? lisinopril (ZESTRIL) 30 MG tablet Take 1 tablet (30 mg total) by mouth daily. 90 tablet 1  ? meclizine (ANTIVERT) 25 MG tablet Take 1-2 tablets (25-50 mg total) by mouth 3 (three) times daily

## 2021-10-27 NOTE — Plan of Care (Signed)
Patient will go next 24 hours without blood stool ?

## 2021-10-28 ENCOUNTER — Encounter: Payer: Self-pay | Admitting: Family Medicine

## 2021-10-28 ENCOUNTER — Telehealth: Payer: Self-pay

## 2021-10-28 DIAGNOSIS — I10 Essential (primary) hypertension: Secondary | ICD-10-CM | POA: Diagnosis not present

## 2021-10-28 DIAGNOSIS — K264 Chronic or unspecified duodenal ulcer with hemorrhage: Secondary | ICD-10-CM | POA: Diagnosis not present

## 2021-10-28 DIAGNOSIS — K922 Gastrointestinal hemorrhage, unspecified: Secondary | ICD-10-CM

## 2021-10-28 DIAGNOSIS — N179 Acute kidney failure, unspecified: Secondary | ICD-10-CM

## 2021-10-28 LAB — CBC
HCT: 27.5 % — ABNORMAL LOW (ref 39.0–52.0)
Hemoglobin: 8.7 g/dL — ABNORMAL LOW (ref 13.0–17.0)
MCH: 30.1 pg (ref 26.0–34.0)
MCHC: 31.6 g/dL (ref 30.0–36.0)
MCV: 95.2 fL (ref 80.0–100.0)
Platelets: 201 10*3/uL (ref 150–400)
RBC: 2.89 MIL/uL — ABNORMAL LOW (ref 4.22–5.81)
RDW: 13.4 % (ref 11.5–15.5)
WBC: 18.8 10*3/uL — ABNORMAL HIGH (ref 4.0–10.5)
nRBC: 0 % (ref 0.0–0.2)

## 2021-10-28 LAB — HEMOGLOBIN AND HEMATOCRIT, BLOOD
HCT: 25.2 % — ABNORMAL LOW (ref 39.0–52.0)
HCT: 22.6 % — ABNORMAL LOW (ref 39.0–52.0)
Hemoglobin: 8.1 g/dL — ABNORMAL LOW (ref 13.0–17.0)
Hemoglobin: 7.5 g/dL — ABNORMAL LOW (ref 13.0–17.0)

## 2021-10-28 LAB — BASIC METABOLIC PANEL
Anion gap: 5 (ref 5–15)
BUN: 58 mg/dL — ABNORMAL HIGH (ref 8–23)
CO2: 21 mmol/L — ABNORMAL LOW (ref 22–32)
Calcium: 7.9 mg/dL — ABNORMAL LOW (ref 8.9–10.3)
Chloride: 112 mmol/L — ABNORMAL HIGH (ref 98–111)
Creatinine, Ser: 1.42 mg/dL — ABNORMAL HIGH (ref 0.61–1.24)
GFR, Estimated: 50 mL/min — ABNORMAL LOW (ref >60)
Glucose, Bld: 95 mg/dL (ref 70–99)
Potassium: 4.5 mmol/L (ref 3.5–5.1)
Sodium: 138 mmol/L (ref 135–145)

## 2021-10-28 MED ORDER — BOOST / RESOURCE BREEZE PO LIQD CUSTOM
1.0000 | Freq: Three times a day (TID) | ORAL | Status: DC
  Administered 2021-10-28 – 2021-10-29 (×6): 1 via ORAL

## 2021-10-28 NOTE — Telephone Encounter (Signed)
-----   Message from Thornton Park, MD sent at 10/28/2021 12:02 PM EDT ----- ?Gregory Blevins has been admitted with a GI bleed related to erosive esophagitis and a large duodenal ulcer. He has been using an aspirin for DVT prophylaxis following recent shoulder surgery. Gastric biopsies are pending.  ? ?I recommend: ?(1) Office visit with Dr. Hilarie Fredrickson or an APP in 3-4 weeks ?(2) EGD in 8-10 weeks with Dr. Hilarie Fredrickson to document healing ? ?Thank you ? ?KLB ? ?

## 2021-10-28 NOTE — Progress Notes (Addendum)
Patient ID: Gregory Blevins, male   DOB: [birth year], 81 y.o.   MRN: [id number] ? ? ? Progress Note ? ? Subjective  ? ? Day # 2 ?CC;GI bleed/ melena ? ?EGD yesterday-grade D esophagitis but no active bleeding, stomach normal, there was 1 large nonbleeding deeply cratered duodenal ulcer no overlying clot or visible vessel ? ?Hemoglobin 9.3 yesterday> 8.8 last p.m.> 8.7 today/hematocrit 27.5/WBC 18.8 ?BUN 58/creatinine 1.42 ? ?Patient sitting up in the chair, says he feels a little better arm painful but not intolerable ?No nausea or vomiting, has kept down some clear liquids thinks he may be starting to develop some appetite ?No abdominal pain ?No bowel movements overnight ? ? ?Vital signs in last 24 hours: ?Temp:  [97.5 ?F (36.4 ?C)-97.9 ?F (36.6 ?C)] 97.9 ?F (36.6 ?C) (04/21 5361) ?Pulse Rate:  [68-74] 68 (04/21 0826) ?Resp:  [10-21] 18 (04/21 0826) ?BP: (103-167)/(34-84) 103/45 (04/21 0826) ?SpO2:  [93 %-100 %] 97 % (04/21 0826) ?Last BM Date : 10/27/21 ?General:    Elderly white male in NAD ?Heart:  Regular rate and rhythm; no murmurs ?Lungs: Respirations even and unlabored, lungs CTA bilaterally ?Abdomen:  Soft, nontender and nondistended. Normal bowel sounds. ?Extremities:  Without edema. ?Neurologic:  Alert and oriented,  grossly normal neurologically. ?Psych:  Cooperative. Normal mood and affect. ? ?Intake/Output from previous day: ?04/20 0701 - 04/21 0700 ?In: 125 [I.V.:125] ?Out: 650 [Urine:650] ?Intake/Output this shift: ?No intake/output data recorded. ? ?Lab Results: ?Recent Labs  ?  10/27/21 ?1159 10/27/21 ?1918 10/28/21 ?0024  ?WBC 18.9* 18.2* 18.8*  ?HGB 9.3* 8.8* 8.7*  ?HCT 28.5* 26.8* 27.5*  ?PLT 178 190 201  ? ?BMET ?Recent Labs  ?  10/26/21 ?1650 10/27/21 ?0110 10/28/21 ?0024  ?NA 134* 137  135 138  ?K 5.2* 4.8  4.8 4.5  ?CL 104 114*  111 112*  ?CO2 23 17*  18* 21*  ?GLUCOSE 136* 108*  111* 95  ?BUN 87* 85*  84* 58*  ?CREATININE 1.61* 1.40*  1.44* 1.42*  ?CALCIUM 7.9* 7.3*  7.5* 7.9*   ? ?LFT ?Recent Labs  ?  10/27/21 ?0110  ?PROT 4.5*  ?ALBUMIN 2.3*  ?AST 19  ?ALT 19  ?ALKPHOS 38  ?BILITOT 0.8  ? ?PT/INR ?No results for input(s): LABPROT, INR in the last 72 hours. ? ?Studies/Results: ?DG Chest Portable 1 View ? ?Result Date: 10/26/2021 ?CLINICAL DATA:  Constipation and dark stool. EXAM: PORTABLE CHEST 1 VIEW COMPARISON:  None. FINDINGS: The heart size and mediastinal contours are within normal limits. Low lung volumes are noted with mild left infrahilar scarring and/or atelectasis. There is no evidence of a pleural effusion or pneumothorax. Marked severity degenerative changes seen involving the right shoulder. A radiopaque left shoulder replacement is seen. IMPRESSION: Low lung volumes with mild left infrahilar scarring and/or atelectasis. Electronically Signed   By: Virgina Norfolk M.D.   On: 10/26/2021 17:09  ? ?ECHOCARDIOGRAM COMPLETE ? ?Result Date: 10/27/2021 ?   ECHOCARDIOGRAM REPORT   Patient Name:   Gregory Blevins Date of Exam: 10/27/2021 Medical Rec #:  [id number]         Height:       67.0 in Accession #:    [id number]        Weight:       224.9 lb Date of Birth:  Jun 22, 1941        BSA:          2.126 m? Patient Age:    71 years  BP:           139/39 mmHg Patient Gender: M                 HR:           70 bpm. Exam Location:  Inpatient Procedure: 2D Echo, Intracardiac Opacification Agent, Color Doppler and Cardiac            Doppler Indications:    Dyspnea  History:        Patient has no prior history of Echocardiogram examinations.                 Risk Factors:Hypertension.  Sonographer:    Clayton Lefort RDCS (AE) Referring Phys: [place]  Sonographer Comments: Technically difficult study due to poor echo windows. IMPRESSIONS  1. Left ventricular ejection fraction, by estimation, is 60 to 65%. The left ventricle has normal function. The left ventricle has no regional wall motion abnormalities. There is mild concentric left ventricular hypertrophy. Left ventricular  diastolic parameters are consistent with Grade I diastolic dysfunction (impaired relaxation).  2. Right ventricular systolic function is normal. The right ventricular size is normal.  3. The mitral valve is normal in structure. No evidence of mitral valve regurgitation. No evidence of mitral stenosis.  4. The aortic valve was not well visualized. Aortic valve regurgitation is not visualized. No aortic stenosis is present.  5. The inferior vena cava is normal in size with greater than 50% respiratory variability, suggesting right atrial pressure of 3 mmHg. Comparison(s): No prior Echocardiogram. FINDINGS  Left Ventricle: Left ventricular ejection fraction, by estimation, is 60 to 65%. The left ventricle has normal function. The left ventricle has no regional wall motion abnormalities. Definity contrast agent was given IV to delineate the left ventricular  endocardial borders. The left ventricular internal cavity size was normal in size. There is mild concentric left ventricular hypertrophy. Left ventricular diastolic parameters are consistent with Grade I diastolic dysfunction (impaired relaxation). Right Ventricle: The right ventricular size is normal. No increase in right ventricular wall thickness. Right ventricular systolic function is normal. Left Atrium: Left atrial size was normal in size. Right Atrium: Right atrial size was normal in size. Pericardium: There is no evidence of pericardial effusion. Mitral Valve: The mitral valve is normal in structure. No evidence of mitral valve regurgitation. No evidence of mitral valve stenosis. Tricuspid Valve: The tricuspid valve is normal in structure. Tricuspid valve regurgitation is not demonstrated. No evidence of tricuspid stenosis. Aortic Valve: The aortic valve was not well visualized. Aortic valve regurgitation is not visualized. No aortic stenosis is present. Aortic valve mean gradient measures 3.0 mmHg. Aortic valve peak gradient measures 6.7 mmHg. Aortic valve  area, by VTI measures 3.76 cm?. Pulmonic Valve: The pulmonic valve was not well visualized. Pulmonic valve regurgitation is not visualized. Aorta: The aortic root and ascending aorta are structurally normal, with no evidence of dilitation. Venous: The inferior vena cava is normal in size with greater than 50% respiratory variability, suggesting right atrial pressure of 3 mmHg. IAS/Shunts: No atrial level shunt detected by color flow Doppler.  LEFT VENTRICLE PLAX 2D LVIDd:         4.40 cm   Diastology LVIDs:         3.20 cm   LV e' medial:    5.77 cm/s LV PW:         1.30 cm   LV E/e' medial:  12.7 LV IVS:  1.30 cm   LV e' lateral:   8.16 cm/s LVOT diam:     2.20 cm   LV E/e' lateral: 9.0 LV SV:         78 LV SV Index:   37 LVOT Area:     3.80 cm?  RIGHT VENTRICLE            IVC RV S prime:     8.66 cm/s  IVC diam: 1.80 cm TAPSE (M-mode): 1.6 cm LEFT ATRIUM             Index LA diam:        2.90 cm 1.36 cm/m? LA Vol (A2C):   28.9 ml 13.60 ml/m? LA Vol (A4C):   44.8 ml 21.08 ml/m? LA Biplane Vol: 36.9 ml 17.36 ml/m?  AORTIC VALVE AV Area (Vmax):    3.42 cm? AV Area (Vmean):   3.17 cm? AV Area (VTI):     3.76 cm? AV Vmax:           129.00 cm/s AV Vmean:          87.900 cm/s AV VTI:            0.207 m AV Peak Grad:      6.7 mmHg AV Mean Grad:      3.0 mmHg LVOT Vmax:         116.00 cm/s LVOT Vmean:        73.200 cm/s LVOT VTI:          0.205 m LVOT/AV VTI ratio: 0.99  AORTA Ao Root diam: 3.50 cm Ao Asc diam:  3.30 cm MITRAL VALVE MV Area (PHT): 3.76 cm?    SHUNTS MV Decel Time: 202 msec    Systemic VTI:  0.20 m MV E velocity: 73.30 cm/s  Systemic Diam: 2.20 cm MV A velocity: 70.70 cm/s MV E/A ratio:  1.04 Rudean Haskell MD Electronically signed by Rudean Haskell MD Signature Date/Time: 10/27/2021/5:31:18 PM    Final    ? ? ? ? Assessment / Plan:   ? ?#34 81 year old white male admitted with melena, 10 days postop left shoulder replacement ?SXS  associated with weakness and dizziness, mild hypotension on  admission ?Patient has had general failure to thrive since surgery. ? ?5 g drop in hemoglobin since surgery-diffuse x2 ?hgb stable today ? ?EGD yesterday with finding of severe grade D esophagitis, and 1 deep cra

## 2021-10-28 NOTE — Progress Notes (Signed)
?      ?                 PROGRESS NOTE ? ?      ?PATIENT DETAILS ?Name: Gregory Blevins ?Age: 81 y.o. ?Sex: male ?Date of Birth: Apr 30, 1941 ?Admit Date: 10/26/2021 ?Admitting Physician Kayleen Memos, DO ?TDD:UKGURKY, [name], MD ? ?Brief Summary: ?Patient is a 81 y.o.  male with history of s/p shoulder replacement on 4/10, HTN, CKD stage IIIa, chronic lower extremity edema-who presented with upper GI bleeding with acute blood loss anemia.   ? ?Significant events: ?4/19>> presented with melanotic stools and acute blood loss anemia-s/p 2 units of PRBC transfusion. ? ?Significant studies: ?4/19>> CXR: No PNA ? ?Significant microbiology data: ?4/20>> blood culture: No growth ? ?Procedures: ?4/20 >>EGD: Nonbleeding, large cratered duodenal ulcer with pigmentation. ? ?Consults: ?GI ? ?Subjective: ?No black stools overnight.  Lying comfortably in bed. ? ?Objective: ?Vitals: ?Blood pressure (!) 103/45, pulse 68, temperature 97.9 ?F (36.6 ?C), temperature source Oral, resp. rate 18, height 5' 7"  (1.702 m), weight 102 kg, SpO2 97 %.  ? ?Exam: ?Gen Exam:Alert awake-not in any distress ?HEENT:atraumatic, normocephalic ?Chest: B/L clear to auscultation anteriorly ?CVS:S1S2 regular ?Abdomen:soft non tender, non distended ?Extremities: Trace edema ?Neurology: Non focal ?Skin: no rash  ? ?Pertinent Labs/Radiology: ? ?  Latest Ref Rng & Units 10/28/2021  ? 12:24 AM 10/27/2021  ?  7:18 PM 10/27/2021  ? 11:59 AM  ?CBC  ?WBC 4.0 - 10.5 K/uL 18.8   18.2   18.9    ?Hemoglobin 13.0 - 17.0 g/dL 8.7   8.8   9.3    ?Hematocrit 39.0 - 52.0 % 27.5   26.8   28.5    ?Platelets 150 - 400 K/uL 201   190   178    ?  ?Lab Results  ?Component Value Date  ? NA 138 10/28/2021  ? K 4.5 10/28/2021  ? CL 112 (H) 10/28/2021  ? CO2 21 (L) 10/28/2021  ? ?  ? ?Assessment/Plan: ?Upper GI bleeding with acute blood loss anemia: Secondary to duodenal ulcer.  Hemoglobin stable overnight-no further bleeding-s/p 2 units of PRBC on admission.  Given high risk  features seen on EGD-recommendations from GI to continue PPI infusion x72 hours.  Continue to follow CBC closely.   ? ?AKI on CKD stage IIIa: AKI hemodynamically mediated-in the setting of blood loss-slowly improving.  ? ?Mild hypotension: Due to hypovolemia/blood loss-BP stable this morning.  Continue to hold all antihypertensives-resume when able. ? ?Leukocytosis: No sources of infection apparent-he does not have any signs symptoms consistent with an infection.  Orthopedics [facility], [place]) examined right shoulder wound-no signs of infection.  Blood cultures remains negative.  Suspect could be reactive from inflammation associated with duodenal ulcer.  Thankfully WBC trending down-continue to watch closely off antimicrobial therapy given clinical stability. ? ?HTN: BP better-continue to hold all antihypertensives.  ? ?HLD: Hold statins for now. ? ?Chronic lower extremity edema: Unclear etiology-UA negative for proteinuria-checking echo.  Furosemide on hold due to soft blood pressure. ? ?Recent left shoulder replacement: See above-continue supportive care.  PT/OT ordered. ? ?BMI: ?Estimated body mass index is 35.22 kg/m? as calculated from the following: ?  Height as of this encounter: 5' 7"  (1.702 m). ?  Weight as of this encounter: 102 kg.  ? ?Code status: ?  Code Status: Full Code  ? ?DVT Prophylaxis: ?SCDs Start: 10/26/21 2054 ?  ?Family Communication: Dione Booze [phone] bedside on 4/21. ? ? ?  Disposition Plan: ?Status is: Inpatient ?Remains inpatient appropriate because: Upper GI bleeding with ABLA due to duodenal ulcer-GI recommending 72 hours of IV PPI infusion. ?  ?Planned Discharge Destination:Home likely over the weekend. ? ? ?Diet: ?Diet Order   ? ?       ?  Diet full liquid Room service appropriate? Yes; Fluid consistency: Thin  Diet effective now       ?  ? ?  ?  ? ?  ?  ? ? ?Antimicrobial agents: ?Anti-infectives (From admission, onward)  ? ? None  ? ?  ? ? ? ?MEDICATIONS: ?Scheduled  Meds: ? feeding supplement  1 Container Oral TID BM  ? [START ON 10/30/2021] pantoprazole  40 mg Intravenous Q12H  ? ?Continuous Infusions: ? sodium chloride 125 mL/hr at 10/27/21 1327  ? sodium chloride    ? sodium chloride    ? pantoprazole 8 mg/hr (10/27/21 1327)  ? ?PRN Meds:.acetaminophen, melatonin, oxyCODONE, polyethylene glycol ? ? ?I have personally reviewed following labs and imaging studies ? ?LABORATORY DATA: ?CBC: ?Recent Labs  ?Lab 10/26/21 ?1650 10/27/21 ?0110 10/27/21 ?1159 10/27/21 ?1918 10/28/21 ?0024  ?WBC 25.9* 18.5* 18.9* 18.2* 18.8*  ?NEUTROABS 21.7* 14.8*  --   --   --   ?HGB 8.1* 9.0*  9.0* 9.3* 8.8* 8.7*  ?HCT 25.1* 28.5*  28.7* 28.5* 26.8* 27.5*  ?MCV 94.4 95.6 93.8 93.7 95.2  ?PLT 265 114* 178 190 201  ? ? ? ?Basic Metabolic Panel: ?Recent Labs  ?Lab 10/26/21 ?1650 10/27/21 ?0110 10/28/21 ?0024  ?NA 134* 137  135 138  ?K 5.2* 4.8  4.8 4.5  ?CL 104 114*  111 112*  ?CO2 23 17*  18* 21*  ?GLUCOSE 136* 108*  111* 95  ?BUN 87* 85*  84* 58*  ?CREATININE 1.61* 1.40*  1.44* 1.42*  ?CALCIUM 7.9* 7.3*  7.5* 7.9*  ?MG  --  2.1  --   ?PHOS  --  2.9  --   ? ? ? ?GFR: ?Estimated Creatinine Clearance: 47.2 mL/min (A) (by C-G formula based on SCr of 1.42 mg/dL (H)). ? ?Liver Function Tests: ?Recent Labs  ?Lab 10/26/21 ?1650 10/27/21 ?0110  ?AST 21 19  ?ALT 22 19  ?ALKPHOS 47 38  ?BILITOT 0.9 0.8  ?PROT 5.1* 4.5*  ?ALBUMIN 2.6* 2.3*  ? ? ?No results for input(s): LIPASE, AMYLASE in the last 168 hours. ?No results for input(s): AMMONIA in the last 168 hours. ? ?Coagulation Profile: ?No results for input(s): INR, PROTIME in the last 168 hours. ? ?Cardiac Enzymes: ?No results for input(s): CKTOTAL, CKMB, CKMBINDEX, TROPONINI in the last 168 hours. ? ?BNP (last 3 results) ?No results for input(s): PROBNP in the last 8760 hours. ? ?Lipid Profile: ?No results for input(s): CHOL, HDL, LDLCALC, TRIG, CHOLHDL, LDLDIRECT in the last 72 hours. ? ?Thyroid Function Tests: ?No results for input(s): TSH,  T4TOTAL, FREET4, T3FREE, THYROIDAB in the last 72 hours. ? ?Anemia Panel: ?No results for input(s): VITAMINB12, FOLATE, FERRITIN, TIBC, IRON, RETICCTPCT in the last 72 hours. ? ?Urine analysis: ?   ?Component Value Date/Time  ? COLORURINE YELLOW 10/11/2021 1143  ? APPEARANCEUR CLEAR 10/11/2021 1143  ? LABSPEC 1.010 10/11/2021 1143  ? PHURINE 5.0 10/11/2021 1143  ? GLUCOSEU NEGATIVE 10/11/2021 1143  ? GLUCOSEU NEGATIVE 04/09/2018 0858  ? HGBUR NEGATIVE 10/11/2021 1143  ? BILIRUBINUR NEGATIVE 10/11/2021 1143  ? BILIRUBINUR Negative 10/03/2017 1001  ? KETONESUR NEGATIVE 10/11/2021 1143  ? PROTEINUR NEGATIVE 10/11/2021 1143  ? UROBILINOGEN 0.2 04/09/2018 0858  ?  NITRITE NEGATIVE 10/11/2021 1143  ? LEUKOCYTESUR NEGATIVE 10/11/2021 1143  ? ? ?Sepsis Labs: ?Lactic Acid, Venous ?   ?Component Value Date/Time  ? LATICACIDVEN 1.0 10/27/2021 0110  ? ? ?MICROBIOLOGY: ?Recent Results (from the past 240 hour(s))  ?Culture, blood (Routine X 2) w Reflex to ID Panel     Status: None (Preliminary result)  ? Collection Time: 10/27/21 11:59 AM  ? Specimen: BLOOD  ?Result Value Ref Range Status  ? Specimen Description BLOOD BLOOD RIGHT HAND  Final  ? Special Requests   Final  ?  BOTTLES DRAWN AEROBIC AND ANAEROBIC Blood Culture results may not be optimal due to an inadequate volume of blood received in culture bottles  ? Culture   Final  ?  NO GROWTH < 24 HOURS ?Performed at [facility], [place] [address] [place], [place] [address] ?  ? Report Status PENDING  Incomplete  ?Culture, blood (Routine X 2) w Reflex to ID Panel     Status: None (Preliminary result)  ? Collection Time: 10/27/21  7:18 PM  ? Specimen: BLOOD RIGHT HAND  ?Result Value Ref Range Status  ? Specimen Description BLOOD RIGHT HAND  Final  ? Special Requests   Final  ?  BOTTLES DRAWN AEROBIC AND ANAEROBIC Blood Culture adequate volume  ? Culture   Final  ?  NO GROWTH < 12 HOURS ?Performed at [facility], [place] [address] [place], [place] [address] ?  ? Report  Status PENDING  Incomplete  ? ? ?RADIOLOGY STUDIES/RESULTS: ?DG Chest Portable 1 View ? ?Result Date: 10/26/2021 ?CLINICAL DATA:  Constipation and dark stool. EXAM: PORTABLE CHEST 1 VIEW COMPARISON:  None. FINDINGS: The heart

## 2021-10-28 NOTE — Plan of Care (Signed)

## 2021-10-28 NOTE — Evaluation (Signed)
Physical Therapy Evaluation ?Patient Details ?Name: Gregory Blevins ?MRN: [id number] ?DOB: [birth year] ?Today's Date: 10/28/2021 ? ?History of Present Illness ? Gregory Blevins is a 81 yo male [name]21 with poor PO intake, dizziness, lightheadedness, hypotesion, and dark stool. EGD performed 4/20 shows deep nonbleeding duodenal ulcer. He is currently s/p 10 days L shoulder replacement in sling. PMH includes L reverse TSA, diverticulosis, HTN, HLD, obesity, OA, prediabetes.  ?Clinical Impression ? Patient presented to [facility] on 10/26/21 with poor PO intake, dizziness, lightheadedness, hypotension, and dark stool consistent with patient's diagnosis of GI bleed. Pt's impairments include decreased cardiopulmonary conditioning, activity tolerance, strength, and balance. These impairments are limiting his ability to safely and independently ambulate and navigate stairs. Patient requires min guard A with transfers and ambulation while using cane. Pt did experience dizziness with mobility that did not resolve until he sat down. BP below. RN notified. SPT recommending Home Health PT upon D/C due to mobility deficits. PT will continue to follow acutely. ?   ?Orthostatic BPs ?- taken on R ankle ?Sitting 171/45mHg  ?Standing 150/1037mg  ?Sitting after ambulation 168/8713m  ? ?  ? ?Recommendations for follow up therapy are one component of a multi-disciplinary discharge planning process, led by the attending physician.  Recommendations may be updated based on patient status, additional functional criteria and insurance authorization. ? ?Follow Up Recommendations Home health PT ? ?  ?Assistance Recommended at Discharge Intermittent Supervision/Assistance  ?Patient can return home with the following ? A little help with walking and/or transfers;A little help with bathing/dressing/bathroom;Assistance with cooking/housework;Assist for transportation;Help with stairs or ramp for entrance ? ?  ?Equipment Recommendations None  recommended by PT  ?Recommendations for Other Services ?    ?  ?Functional Status Assessment Patient has had a recent decline in their functional status and demonstrates the ability to make significant improvements in function in a reasonable and predictable amount of time.  ? ?  ?Precautions / Restrictions Precautions ?Precautions: Fall;Shoulder ?Type of Shoulder Precautions: reverse TSA ?Shoulder Interventions: Shoulder sling/immobilizer;At all times;Off for dressing/bathing/exercises ?Precaution Booklet Issued: No ?Required Braces or Orthoses: Sling ?Restrictions ?Weight Bearing Restrictions: Yes ?LUE Weight Bearing: Non weight bearing  ? ?  ? ?Mobility ? Bed Mobility ?  ?  ?  ?  ?  ?  ?  ?General bed mobility comments: pt in recliner upon arrival. BP taken on R ankle: 171/37m69m ?  ? ?Transfers ?Overall transfer level: Needs assistance ?Equipment used: Straight cane ?Transfers: Sit to/from Stand ?Sit to Stand: Min guard ?  ?  ?  ?  ?  ?General transfer comment: increased time to power up to stand, however, requiring min guard A for steadying and safety with transfer. Pt had cane in R hand but it was not touching the ground during transfer. Pt dizzy in standing. BP taken on ankle in standing: 150/106mm83m?  ? ?Ambulation/Gait ?Ambulation/Gait assistance: Min guard ?Gait Distance (Feet): 10 Feet ?Assistive device: Straight cane ?Gait Pattern/deviations: Step-to pattern, Decreased stride length, Shuffle ?Gait velocity: decreased ?Gait velocity interpretation: <1.8 ft/sec, indicate of risk for recurrent falls ?  ?General Gait Details: Pt requiring min guard A with ambulation in room for safety and steadying due to no resolve in dizziness. Pt with difficulty sequencing with the SPC, even with cues. Demonstrating a step-to gait pattern with shuffling. Due to dizziness not improving and pt fatigue, further mobility deferred. BP taken on ankle after actitivity in sitting: 168/87mmH73m ?Stairs ?  ?  ?  ?  ?   ? ?  Wheelchair Mobility ?  ? ?Modified Rankin (Stroke Patients Only) ?  ? ?  ? ?Balance Overall balance assessment: Needs assistance ?Sitting-balance support: Feet supported ?Sitting balance-Leahy Scale: Fair ?Sitting balance - Comments: pt had difficulty sitting without back support of recliner requiring supervision for safety. ?  ?Standing balance support: Single extremity supported, During functional activity ?Standing balance-Leahy Scale: Fair ?Standing balance comment: pt requiring min guard A for steadying and safety while pt used single point cane in standing. Dynamically pt had difficulty sequencing and kept cane raised off ground for prolonged periods during ambulation. ?  ?  ?  ?  ?  ?  ?  ?  ?  ?  ?  ?   ? ? ? ?Pertinent Vitals/Pain Pain Assessment ?Pain Assessment: Faces ?Faces Pain Scale: No hurt  ? ? ?Home Living Family/patient expects to be discharged to:: Private residence ?Living Arrangements: Spouse/significant other ?Available Help at Discharge: Family;Available 24 hours/day ?Type of Home: House ?Home Access: Stairs to enter ?Entrance Stairs-Rails: Left ?Entrance Stairs-Number of Steps: 5-6 ?  ?Home Layout: One level ?Home Equipment: Shower seat;Cane - single point ?   ?  ?Prior Function Prior Level of Function : Needs assist ?  ?  ?  ?  ?  ?  ?Mobility Comments: has been using cane in the house; has trouble descending steps at home due to railing on L. needs assist with stair navigation since L shoulder sx ?  ?  ? ? ?Hand Dominance  ? Dominant Hand: Left ? ?  ?Extremity/Trunk Assessment  ? Upper Extremity Assessment ?Upper Extremity Assessment: Defer to OT evaluation ?  ? ?Lower Extremity Assessment ?Lower Extremity Assessment: Generalized weakness ?  ? ?Cervical / Trunk Assessment ?Cervical / Trunk Assessment: Other exceptions ?Cervical / Trunk Exceptions: increased body habitus  ?Communication  ?    ?Cognition Arousal/Alertness: Awake/alert ?Behavior During Therapy: Flat affect ?Overall  Cognitive Status: Within Functional Limits for tasks assessed ?  ?  ?  ?  ?  ?  ?  ?  ?  ?  ?  ?  ?  ?  ?  ?  ?  ?  ?  ? ?  ?General Comments General comments (skin integrity, edema, etc.): orthostatic with sit<>stand. No resolve in dizziness. ? ?  ?Exercises    ? ?Assessment/Plan  ?  ?PT Assessment Patient needs continued PT services  ?PT Problem List   ? ?   ?  ?PT Treatment Interventions DME instruction;Gait training;Stair training;Functional mobility training;Therapeutic activities;Therapeutic exercise;Balance training;Patient/family education   ? ?PT Goals (Current goals can be found in the Care Plan section)  ?Acute Rehab PT Goals ?Patient Stated Goal: to return home ?PT Goal Formulation: With patient/family ?Time For Goal Achievement: 10/25/21 ?Potential to Achieve Goals: Good ? ?  ?Frequency Min 3X/week ?  ? ? ?Co-evaluation   ?  ?  ?  ?  ? ? ?  ?AM-PAC PT "6 Clicks" Mobility  ?Outcome Measure Help needed turning from your back to your side while in a flat bed without using bedrails?: A Little ?Help needed moving from lying on your back to sitting on the side of a flat bed without using bedrails?: A Little ?Help needed moving to and from a bed to a chair (including a wheelchair)?: A Little ?Help needed standing up from a chair using your arms (e.g., wheelchair or bedside chair)?: A Little ?Help needed to walk in hospital room?: A Little ?Help needed climbing 3-5 steps with a railing? : A  Lot ?6 Click Score: 17 ? ?  ?End of Session Equipment Utilized During Treatment: Gait belt;Other (comment) (sling) ?Activity Tolerance: Treatment limited secondary to medical complications (Comment) ?Patient left: in bed;with call bell/phone within reach;with family/visitor present ?Nurse Communication: Mobility status ?PT Visit Diagnosis: Unsteadiness on feet (R26.81);Difficulty in walking, not elsewhere classified (R26.2);Dizziness and giddiness (R42) ?  ? ?Time: 6754-4920 ?PT Time Calculation (min) (ACUTE ONLY): 28  min ? ? ?Charges:   PT Evaluation ?$PT Eval Moderate Complexity: 1 Mod ?PT Treatments ?$Therapeutic Activity: 8-22 mins ?  ?   ? ? ?Jonne Ply, SPT ? ?Jonne Ply ?10/28/2021, 1:21 PM ? ?

## 2021-10-28 NOTE — Telephone Encounter (Signed)
Pt scheduled to see Dr. Hilarie Fredrickson 12/01/21 at 8:50am. Pt scheduled for EGD in the [facility] 01/02/22 at 10am. Amb ref entered in epic. Pt will need instructions at the time of OV. ?

## 2021-10-29 DIAGNOSIS — K264 Chronic or unspecified duodenal ulcer with hemorrhage: Secondary | ICD-10-CM | POA: Diagnosis not present

## 2021-10-29 LAB — BASIC METABOLIC PANEL
Anion gap: 3 — ABNORMAL LOW (ref 5–15)
BUN: 31 mg/dL — ABNORMAL HIGH (ref 8–23)
CO2: 21 mmol/L — ABNORMAL LOW (ref 22–32)
Calcium: 7.5 mg/dL — ABNORMAL LOW (ref 8.9–10.3)
Chloride: 113 mmol/L — ABNORMAL HIGH (ref 98–111)
Creatinine, Ser: 1.25 mg/dL — ABNORMAL HIGH (ref 0.61–1.24)
GFR, Estimated: 58 mL/min — ABNORMAL LOW (ref >60)
Glucose, Bld: 96 mg/dL (ref 70–99)
Potassium: 4.1 mmol/L (ref 3.5–5.1)
Sodium: 137 mmol/L (ref 135–145)

## 2021-10-29 LAB — CBC
HCT: 23.1 % — ABNORMAL LOW (ref 39.0–52.0)
HCT: 23.4 % — ABNORMAL LOW (ref 39.0–52.0)
Hemoglobin: 7.4 g/dL — ABNORMAL LOW (ref 13.0–17.0)
Hemoglobin: 7.5 g/dL — ABNORMAL LOW (ref 13.0–17.0)
MCH: 30.5 pg (ref 26.0–34.0)
MCH: 30.5 pg (ref 26.0–34.0)
MCHC: 32 g/dL (ref 30.0–36.0)
MCHC: 32.1 g/dL (ref 30.0–36.0)
MCV: 95.1 fL (ref 80.0–100.0)
MCV: 95.1 fL (ref 80.0–100.0)
Platelets: 158 10*3/uL (ref 150–400)
Platelets: 186 10*3/uL (ref 150–400)
RBC: 2.43 MIL/uL — ABNORMAL LOW (ref 4.22–5.81)
RBC: 2.46 MIL/uL — ABNORMAL LOW (ref 4.22–5.81)
RDW: 13.4 % (ref 11.5–15.5)
RDW: 13.5 % (ref 11.5–15.5)
WBC: 12.7 10*3/uL — ABNORMAL HIGH (ref 4.0–10.5)
WBC: 14.7 10*3/uL — ABNORMAL HIGH (ref 4.0–10.5)
nRBC: 0 % (ref 0.0–0.2)
nRBC: 0 % (ref 0.0–0.2)

## 2021-10-29 LAB — PREPARE RBC (CROSSMATCH)

## 2021-10-29 MED ORDER — FUROSEMIDE 10 MG/ML IJ SOLN
20.0000 mg | Freq: Once | INTRAMUSCULAR | Status: AC
Start: 2021-10-29 — End: 2021-10-29
  Administered 2021-10-29: 20 mg via INTRAVENOUS
  Filled 2021-10-29: qty 2

## 2021-10-29 MED ORDER — FUROSEMIDE 10 MG/ML IJ SOLN
20.0000 mg | Freq: Once | INTRAMUSCULAR | Status: AC
  Administered 2021-10-29: 20 mg via INTRAVENOUS
  Filled 2021-10-29: qty 2

## 2021-10-29 MED ORDER — SODIUM CHLORIDE 0.9% IV SOLUTION: Freq: Once | INTRAVENOUS | Status: AC

## 2021-10-29 NOTE — Progress Notes (Signed)
[facility] night cross cover note: ? ?I was notified by RN with patient's updated hemoglobin result of 7.5 at 2300 on 10/28/2021. patient here for acute upper GI bleed, having undergone EGD on 10/27/2021.  On day of admission, he received transfusion of 2 units PRBC, but has not required any additional blood transfusion.  Continues on Protonix drip.  Trend in hemoglobin notable for the following: Hemoglobin at midnight on 4/21 noted to be 8.7, followed by most recent prior hemoglobin of 8.1 at noon on 10/28/2021. ? ?Vital signs appear stable, with most recent blood pressure 140/49, heart rate 70.  Patient currently asymptomatic, per discussions with RN, no additional bloody or dark stools during the night shift. ? ?Ensuing CBC drawn at 3 AM showed a stable hemoglobin of 7.4.  Of note, repeat CBC ordered for later this morning. I refrained from prbc transfusion.  ? ? ? ?Babs Bertin, DO ?Hospitalist ? ?

## 2021-10-29 NOTE — Progress Notes (Signed)
?Post-Op Visit Note ?  ?Patient: Gregory Blevins           ?Date of Birth: 08-Aug-1940           ?MRN: [id number] ?Visit Date: @ENCDATE @ ?PCP: Tammi Sou, MD ? ? ?Assessment & Plan: ? ?Chief Complaint:  ?Chief Complaint  ?Patient presents with  ? Hypotension  ? ?Visit Diagnoses: Status post left reverse shoulder arthroplasty ? ?Plan: Gregory Blevins is a 81 y.o. male who presents s/p left reverse total shoulder arthroplasty on 10/17/2021.  He is a little under 2 weeks out from procedure.  Reports pain is overall controlled.  Postop recovery has been complicated by upper GI bleed requiring hospitalization.  He is currently hospitalized.  He had a lot of fatigue and decreased blood pressure leading up to hospitalization which is improving.  His appetite is improving.  He did have a dark bowel movement this morning.  Currently denies any chest pain, shortness of breath.  Interscalene block has completely worn off.  He was using the CPM machine at home before he was hospitalized and was up to 60 degrees on the machine.  Denies any fevers or chills. ? ?On exam, patient has about 10 degrees external rotation, 45 degrees abduction, 80 degrees forward flexion.  Aquacel dressing removed and incision looks to be healing well.  Intact EPL, FPL, finger abduction, finger adduction, wrist extension, pronation/supination, bicep, tricep.  Sensation intact over the lateral deltoid but it is difficult to ascertain whether or not deltoid muscle is firing axillary nerve is intact due to the swelling from surgery.  He has a lot of swelling in the distal operative extremity.  2+ radial pulse. ? ?Plan is discontinue sling.  No lifting more than 2 pounds.  Okay to use the arm for full active range of motion aside from going behind his back.  Leave Steri-Strips on until they fall off by themselves and okay for sponge bathing and showering but no submersion (bath, pool, hot tub).  He will disregard his upcoming appointment on  Tuesday and follow-up with the office in about 2 weeks for clinical recheck with Dr. Marlou Sa and radiographs at that time..  ? ?Follow-Up Instructions: No follow-ups on file.  ? ?Orders:  ?Orders Placed This Encounter  ?Procedures  ? Critical Care  ? Procedural/ Surgical Case Request: ESOPHAGOGASTRODUODENOSCOPY (EGD) WITH PROPOFOL  ? UPPER ENDOSCOPY  ? Culture, blood (Routine X 2) w Reflex to ID Panel  ? DG Chest Portable 1 View  ? Comprehensive metabolic panel  ? CBC with Differential  ? Lactic acid, plasma  ? CBC with Differential/Platelet  ? Comprehensive metabolic panel  ? Magnesium  ? Phosphorus  ? Basic metabolic panel  ? CBC  ? CBC  ? Basic metabolic panel  ? CBC  ? CBC  ? Basic metabolic panel  ? CBC with Differential/Platelet  ? Comprehensive metabolic panel  ? Magnesium  ? CBC  ? Diet full liquid Room service appropriate? Yes; Fluid consistency: Thin  ? Informed Consent Details: Physician/Practitioner Attestation; Transcribe to consent form and obtain patient signature  ? Cardiac monitoring  ? Vital signs  ? Notify physician (specify)  ? Progressive Mobility Protocol: No Restrictions  ? Initiate Oral Care Protocol  ? Initiate Carrier Fluid Protocol  ? RN may order General Admission PRN Orders utilizing "General Admission PRN medications" (through manage orders) for the following patient needs: allergy symptoms (Claritin), cold sores (Carmex), cough (Robitussin DM), eye irritation (Liquifilm Tears), hemorrhoids (Tucks), indigestion (Maalox),  minor skin irritation (Hydrocortisone Cream), muscle pain Ephraim Hamburger), nose irritation (saline nasal spray) and sore throat (Chloraseptic spray).  ? SCDs  ? Patient has an active order for admit to inpatient/place in observation  ? Cardiac Monitoring Continuous x 48 hours Indications for use: Other; Other indications for use: high risk  ? Pre-admission testing diagnosis  ? Vital signs per Moderate Sedation protocol  ? Up in chair  ? Must be up for meals  ? Elevate head of  bed  ? Ambulate in room  ? Full code  ? Consult to gastroenterology  ? Consult to intensivist  ? Consult to hospitalist  ? OT eval and treat  ? PT eval and treat  ? Oxygen therapy Mode or (Route): Nasal cannula; Liters Per Minute: 2; Keep 02 saturation: greater than 92 %  ? Incentive spirometry RT  ? POC occult blood, ED  ? ED EKG  ? EKG 12-Lead  ? ECHOCARDIOGRAM COMPLETE  ? Type and screen [place]  ? Prepare RBC (crossmatch)  ? ABO/Rh  ? Prepare RBC (crossmatch)  ? BLOOD TRANSFUSION REPORT - SCANNED  ? Admit to Inpatient (patient's expected length of stay will be greater than 2 midnights or inpatient only procedure)  ? ?Meds ordered this encounter  ?Medications  ? sodium chloride 0.9 % bolus 1,000 mL  ? DISCONTD: 0.9 %  sodium chloride infusion  ? DISCONTD: pantoprazole (PROTONIX) injection 40 mg  ? pantoprazole (PROTONIX) 80 mg /NS 100 mL IVPB  ? pantoprozole (PROTONIX) 80 mg /NS 100 mL infusion  ? pantoprazole (PROTONIX) injection 40 mg  ? DISCONTD: 0.9 %  sodium chloride infusion  ? 0.9 %  sodium chloride infusion  ? acetaminophen (TYLENOL) tablet 650 mg  ? oxyCODONE (Oxy IR/ROXICODONE) immediate release tablet 5 mg  ? polyethylene glycol (MIRALAX / GLYCOLAX) packet 17 g  ? melatonin tablet 3 mg  ? perflutren lipid microspheres (DEFINITY) IV suspension  ? feeding supplement (BOOST / RESOURCE BREEZE) liquid 1 Container  ? furosemide (LASIX) injection 20 mg  ? ? ?Imaging: ?No results found. ? ?PMFS History: ?Patient Active Problem List  ? Diagnosis Date Noted  ? AKI (acute kidney injury) ([place])   ? Duodenal ulcer   ? GI bleed 10/26/2021  ? 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 Prostate cancer screening 10/21/2015  ? Preventative health care 04/10/2014  ? 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 Facet arthropathy, lumbar 04/22/2013  ? Pelvic pain in male 03/21/2013  ? Health maintenance examination 09/27/2012  ? HTN  (hypertension), benign 09/27/2012  ? Basal cell carcinoma of skin 07/12/2011  ? Colon polyp 07/12/2011  ? High risk medication use 07/12/2011  ? Elevation of level of transaminase or lactic acid dehydrogenase (LDH) 08/31/2010  ? Onychomycosis due to dermatophyte 08/31/2010  ? Osteoarthrosis involving lower leg 08/31/2010  ? Other and unspecified hyperlipidemia 08/31/2010  ? 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 Myalgia and myositis, unspecified 10/19/2009  ? 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 Morbid obesity ([place]) 06/17/2009  ? 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 ?Past Medical History:  ?Diagnosis Date  ? Chronic left shoulder pain   ? 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 Diverticulosis 2013  ? noted on colonoscopy  ? Fatty liver 05/2012  ? u/s done for mild/persistent elevation of LFTs  ? Hay fever   ? no issues now-? out grown  ? Hearing impairment   ? 11/2015 Audiology eval= not  yet ready for hearing aids ([facility])  ? History of adenomatous polyp of colon   ? On multiple colonoscopies: most recent colonoscopy 05/31/18-->adenoma x 2, repeat 3-5 ys is optional.  ? History of basal cell carcinoma of skin   ? HTN (hypertension)   ? Hyperlipidemia   ? Lumbar spondylosis 04/2013  ? 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  ? Microhematuria 09/2017  ? On dipstick only.  Urine micro 09/2017 and 04/2018-->no RBCs.  ? OAB (overactive bladder)   ? ditropan xl 10 ineffective--pt d/c'd this 09/2018  ? Obesity   ? Osteoarthritis   ? Hips (L>R on x-ray 04/2013), shoulders, and knees (hx of adhesive capsulitis of shoulder)  ? Prediabetes   ? A1c 6.1% 08/2019 at [facility]  ? Rectal leakage   ?  ?Family History  ?Problem Relation Age of Onset  ? Hypertension Father   ? Alcoholism Father   ? Stomach cancer Father   ?     primary with mets to colon  ? Colon cancer Father   ? Dementia Mother   ? Asthma Brother   ? Diabetes Sister   ? Colon polyps Neg Hx   ? Esophageal cancer Neg Hx   ? Rectal cancer Neg  Hx   ?  ?Past Surgical History:  ?Procedure Laterality Date  ? COLONOSCOPY    ? 2008 w/polyps,  09-22-2011 w/polyps.  01/2015 tubular adenoma x 1.  05/31/18 colonoscopy w/ two tubular adenomas.  ? COLONOSCOPY W/ POLYPECTOM

## 2021-10-29 NOTE — Progress Notes (Signed)
0300 hgb 7.4 Provider [place] DO notified. No new orders.  ?

## 2021-10-29 NOTE — Progress Notes (Signed)
?      ?                 PROGRESS NOTE ? ?      ?PATIENT DETAILS ?Name: Gregory Blevins ?Age: 81 y.o. ?Sex: male ?Date of Birth: 12-Oct-1940 ?Admit Date: 10/26/2021 ?Admitting Physician Kayleen Memos, DO ?OVZ:CHYIFOY, [name], MD ? ?Brief Summary: ?Patient is a 81 y.o.  male with history of s/p shoulder replacement on 4/10, HTN, CKD stage IIIa, chronic lower extremity edema-who presented with upper GI bleeding with acute blood loss anemia.   ? ?Significant events: ?4/19>> presented with melanotic stools and acute blood loss anemia-s/p 2 units of PRBC transfusion. ? ?Significant studies: ?4/19>> CXR: No PNA ? ?Significant microbiology data: ?4/20>> blood culture: No growth ? ?Procedures: ?4/20 >>EGD: Nonbleeding, large cratered duodenal ulcer with pigmentation. ?4/20 >> TTE - 1. Left ventricular ejection fraction, by estimation, is 60 to 65%. The left ventricle has normal function. The left ventricle has no regional wall motion abnormalities. There is mild concentric left ventricular hypertrophy. Left ventricular diastolic parameters are consistent with Grade I diastolic dysfunction (impaired relaxation).  2. Right ventricular systolic function is normal. The right ventricular size is normal.  3. The mitral valve is normal in structure. No evidence of mitral valve regurgitation. No evidence of mitral stenosis.  4. The aortic valve was not well visualized. Aortic valve regurgitation is not visualized. No aortic stenosis is present.  5. The inferior vena cava is normal in size with greater than 50% respiratory variability, suggesting right atrial pressure of 3 mmHg. ? ?Consults: ?GI ? ?Subjective:  Patient in bed, appears comfortable, denies any headache, no fever, no chest pain or pressure, no shortness of breath , no abdominal pain. No focal weakness. ? ?Objective: ?Vitals: ?Blood pressure (!) 132/47, pulse 64, temperature 98.2 ?F (36.8 ?C), temperature source Oral, resp. rate 18, height 5' 7"  (1.702 m), weight 102  kg, SpO2 98 %.  ? ?Exam: ? ?Awake Alert, No new F.N deficits, Normal affect, left shoulder in sling ?[place].AT,PERRAL ?Supple Neck, No JVD,   ?Symmetrical Chest wall movement, Good air movement bilaterally, CTAB ?RRR,No Gallops, Rubs or new Murmurs,  ?+ve B.Sounds, Abd Soft, No tenderness,   ?No Cyanosis, Clubbing or edema  ? ?Assessment/Plan: ? ?Upper GI bleeding with acute blood loss anemia: Secondary to duodenal ulcer.  Hemoglobin stable overnight-no further bleeding-s/p 2 units of PRBC on 10/26/21.  EGD on 10/27/21 - Nonbleeding, large cratered duodenal ulcer with pigmentation. recommendations from GI to continue PPI infusion x72 hours.  Continue to follow CBC closely.  May require 1 more unit of packed RBC transfusion on 10/30/2021 but will monitor.  Do not think there is new ongoing bleeding at this time. ? ?AKI on CKD stage IIIa: AKI hemodynamically mediated-in the setting of blood loss-slowly improving.  ? ?Mild hypotension: Due to hypovolemia/blood loss-BP stable this morning.  Continue to hold all antihypertensives-resume when able. ? ?Leukocytosis: No sources of infection apparent-he does not have any signs symptoms consistent with an infection.  Orthopedics [facility], [place]) examined right shoulder wound-no signs of infection.  Blood cultures remains negative.  Suspect could be reactive from inflammation associated with duodenal ulcer.  Thankfully WBC trending down-continue to watch closely off antimicrobial therapy given clinical stability. ? ?HTN: BP better-continue to hold all antihypertensives.  ? ?HLD: Hold statins for now. ? ?Chronic lower extremity edema: Unclear etiology-UA negative for proteinuria-checking echo.  Furosemide on hold due to soft blood pressure. ? ?Recent left shoulder  replacement: See above-continue supportive care.  PT/OT ordered. ? ?BMI: ?Estimated body mass index is 35.22 kg/m? as calculated from the following: ?  Height as of this encounter: 5' 7"  (1.702 m). ?  Weight as of  this encounter: 102 kg.  ? ?Code status: ?  Code Status: Full Code  ? ?DVT Prophylaxis: ?SCDs Start: 10/26/21 2054 ?  ?Family Communication: Dione Booze [phone] 10/29/2021 ? ? ?Disposition Plan: ?Status is: Inpatient ?Remains inpatient appropriate because: Upper GI bleeding with ABLA due to duodenal ulcer-GI recommending 72 hours of IV PPI infusion. ?  ?Planned Discharge Destination:Home likely over the weekend. ? ? ?Diet: ?Diet Order   ? ?       ?  Diet full liquid Room service appropriate? Yes; Fluid consistency: Thin  Diet effective now       ?  ? ?  ?  ? ?  ?  ? ? ?Antimicrobial agents: ?Anti-infectives (From admission, onward)  ? ? None  ? ?  ? ? ? ?MEDICATIONS: ?Scheduled Meds: ? feeding supplement  1 Container Oral TID BM  ? [START ON 10/30/2021] pantoprazole  40 mg Intravenous Q12H  ? ?Continuous Infusions: ? sodium chloride    ? pantoprazole 8 mg/hr (10/29/21 0128)  ? ?PRN Meds:.acetaminophen, melatonin, oxyCODONE, polyethylene glycol ? ? ?I have personally reviewed following labs and imaging studies ? ?LABORATORY DATA: ? ?Recent Labs  ?Lab 10/26/21 ?1650 10/27/21 ?0110 10/27/21 ?1159 10/27/21 ?1918 10/28/21 ?0024 10/28/21 ?1220 10/28/21 ?2316 10/29/21 ?0257  ?WBC 25.9* 18.5* 18.9* 18.2* 18.8*  --   --  12.7*  ?HGB 8.1* 9.0*  9.0* 9.3* 8.8* 8.7* 8.1* 7.5* 7.4*  ?HCT 25.1* 28.5*  28.7* 28.5* 26.8* 27.5* 25.2* 22.6* 23.1*  ?PLT 265 114* 178 190 201  --   --  158  ?MCV 94.4 95.6 93.8 93.7 95.2  --   --  95.1  ?MCH 30.5 30.2 30.6 30.8 30.1  --   --  30.5  ?MCHC 32.3 31.6 32.6 32.8 31.6  --   --  32.0  ?RDW 12.4 13.2 13.3 13.4 13.4  --   --  13.4  ?LYMPHSABS 2.1 2.4  --   --   --   --   --   --   ?MONOABS 1.3* 0.9  --   --   --   --   --   --   ?EOSABS 0.0 0.0  --   --   --   --   --   --   ?BASOSABS 0.0 0.0  --   --   --   --   --   --   ? ? ?Recent Labs  ?Lab 10/26/21 ?1650 10/27/21 ?0110 10/28/21 ?7124 10/29/21 ?5809  ?NA 134* 137  135 138 137  ?K 5.2* 4.8  4.8 4.5 4.1  ?CL 104 114*  111  112* 113*  ?CO2 23 17*  18* 21* 21*  ?GLUCOSE 136* 108*  111* 95 96  ?BUN 87* 85*  84* 58* 31*  ?CREATININE 1.61* 1.40*  1.44* 1.42* 1.25*  ?CALCIUM 7.9* 7.3*  7.5* 7.9* 7.5*  ?AST 21 19  --   --   ?ALT 22 19  --   --   ?ALKPHOS 47 38  --   --   ?BILITOT 0.9 0.8  --   --   ?ALBUMIN 2.6* 2.3*  --   --   ?MG  --  2.1  --   --   ?LATICACIDVEN 2.0* 1.0  --   --   ? ? ?  ?  ? ? ? ?  MICROBIOLOGY: ?Recent Results (from the past 240 hour(s))  ?Culture, blood (Routine X 2) w Reflex to ID Panel     Status: None (Preliminary result)  ? Collection Time: 10/27/21 11:59 AM  ? Specimen: BLOOD  ?Result Value Ref Range Status  ? Specimen Description BLOOD BLOOD RIGHT HAND  Final  ? Special Requests   Final  ?  BOTTLES DRAWN AEROBIC AND ANAEROBIC Blood Culture results may not be optimal due to an inadequate volume of blood received in culture bottles  ? Culture   Final  ?  NO GROWTH 2 DAYS ?Performed at [facility], [place] [address], [place], [place] [address] ?  ? Report Status PENDING  Incomplete  ?Culture, blood (Routine X 2) w Reflex to ID Panel     Status: None (Preliminary result)  ? Collection Time: 10/27/21  7:18 PM  ? Specimen: BLOOD RIGHT HAND  ?Result Value Ref Range Status  ? Specimen Description BLOOD RIGHT HAND  Final  ? Special Requests   Final  ?  BOTTLES DRAWN AEROBIC AND ANAEROBIC Blood Culture adequate volume  ? Culture   Final  ?  NO GROWTH 2 DAYS ?Performed at [facility], [place] [address] [place], [place] [address] ?  ? Report Status PENDING  Incomplete  ? ? ?RADIOLOGY STUDIES/RESULTS: ?ECHOCARDIOGRAM COMPLETE ? ?Result Date: 10/27/2021 ?   ECHOCARDIOGRAM REPORT   Patient Name:   SAVON BORDONARO Date of Exam: 10/27/2021 Medical Rec #:  [id number]         Height:       67.0 in Accession #:    [id number]        Weight:       224.9 lb Date of Birth:  17-Jan-1941        BSA:          2.126 m? Patient Age:    60 years          BP:           139/39 mmHg Patient Gender: M                 HR:           70  bpm. Exam Location:  Inpatient Procedure: 2D Echo, Intracardiac Opacification Agent, Color Doppler and Cardiac            Doppler Indications:    Dyspnea  History:        Patient has no prior history of Echocardiogr

## 2021-10-29 NOTE — Evaluation (Signed)
Occupational Therapy Evaluation ?Patient Details ?Name: Gregory Blevins ?MRN: [id number] ?DOB: 1941-02-12 ?Today's Date: 10/29/2021 ? ? ?History of Present Illness Gregory Blevins is a 81 yo male presenting to [facility] 10/26/21 with poor PO intake, dizziness, lightheadedness, hypotesion, and dark stool. EGD performed 4/20 shows deep nonbleeding duodenal ulcer. He is currently s/p 10 days L shoulder replacement in sling. PMH includes L reverse TSA, diverticulosis, HTN, HLD, obesity, OA, prediabetes.  ? ?Clinical Impression ?  ?Prior to TSA Pt was independent in ADL and mobility.  He enjoys mowing his lawn on his tractor, which is his long term goal to get back to. Today Pt reports a visit from Dr Marlou Sa who said that he is now able complete AAROM with his shoulder. No notes in EMR to confirm, but reviewed exercises for hand, wrist, elbow, as well as lap slides and pendulums. Pt with decreased ROM at all joints, Pt is only able to perform 0-90/100 degrees elbow flexion today and cannot bring his L hand to his face (which is his dominant hand). Pt min guard assist for sit<>stand from recliner today. He reports that this is one of the most difficult things for him to do since his sx. Vc for hand placement on arm rest, getting "nose over toes" or using rocking momentum. No ambulation today as Pt declined and is currently getting blood. OT will continue to follow acutely to maximize safety and independence in ADL and functional transfers with special focus on LUE ROM post TSA. ?   ? ?Recommendations for follow up therapy are one component of a multi-disciplinary discharge planning process, led by the attending physician.  Recommendations may be updated based on patient status, additional functional criteria and insurance authorization.  ? ?Follow Up Recommendations ? Follow physician's recommendations for discharge plan and follow up therapies  ?  ?Assistance Recommended at Discharge Frequent or constant Supervision/Assistance  ?Patient can  return home with the following A little help with walking and/or transfers;A lot of help with bathing/dressing/bathroom;Assist for transportation;Help with stairs or ramp for entrance;Direct supervision/assist for medications management ? ?  ?Functional Status Assessment ? Patient has had a recent decline in their functional status and demonstrates the ability to make significant improvements in function in a reasonable and predictable amount of time.  ?Equipment Recommendations ? None recommended by OT (Pt has appropriate DME)  ?  ?Recommendations for Other Services PT consult ? ? ?  ?Precautions / Restrictions Precautions ?Precautions: Fall;Shoulder ?Type of Shoulder Precautions: reverse TSA ?Shoulder Interventions: Shoulder sling/immobilizer;At all times;Off for dressing/bathing/exercises ?Precaution Booklet Issued: No ?Precaution Comments: per patient, Dr Marlou Sa said that he is allowed to move it now ?Required Braces or Orthoses: Sling (for comfort) ?Restrictions ?Weight Bearing Restrictions: Yes ?LUE Weight Bearing: Non weight bearing  ? ?  ? ?Mobility Bed Mobility ?  ?  ?  ?  ?  ?  ?  ?General bed mobility comments: pt in recliner upon arrival. ?  ? ?Transfers ?Overall transfer level: Needs assistance ?Equipment used: Straight cane ?Transfers: Sit to/from Stand ?Sit to Stand: Min guard, Min assist ?  ?  ?  ?  ?  ?General transfer comment: increased time to power up to stand, however, requiring min guard A for steadying and safety with transfer. vc to use arm rest - Pt reports this has been one of the biggest changes since his sx ?  ? ?  ?Balance Overall balance assessment: Needs assistance ?Sitting-balance support: Feet supported ?Sitting balance-Leahy Scale: Good ?Sitting balance - Comments: in  chair ?  ?Standing balance support: Single extremity supported, During functional activity ?Standing balance-Leahy Scale: Fair ?Standing balance comment: pt requiring min guard A for steadying and safety while pt used  single point cane in standing. ?  ?  ?  ?  ?  ?  ?  ?  ?  ?  ?  ?   ? ?ADL either performed or assessed with clinical judgement  ? ?ADL Overall ADL's : Needs assistance/impaired ?Eating/Feeding: Set up;Sitting ?Eating/Feeding Details (indicate cue type and reason): stil having trouble with BUE tasks like cutting food ?Grooming: Set up;Sitting ?Grooming Details (indicate cue type and reason): cannot bring LUE to face at this time ?Upper Body Bathing: Moderate assistance;Sitting ?  ?Lower Body Bathing: Moderate assistance;Sit to/from stand ?  ?Upper Body Dressing : Moderate assistance;Sitting ?Upper Body Dressing Details (indicate cue type and reason): wife has been assisting since sx ?Lower Body Dressing: Moderate assistance;Sit to/from stand ?  ?Toilet Transfer: Minimal assistance;Ambulation [facility]) ?  ?Toileting- Clothing Manipulation and Hygiene: Min guard;Sitting/lateral lean ?  ?  ?  ?Functional mobility during ADLs: Minimal assistance;Cane ?   ? ? ? ?Vision Baseline Vision/History: 0 No visual deficits ?Ability to See in Adequate Light: 0 Adequate ?Vision Assessment?: No apparent visual deficits  ?   ?Perception   ?  ?Praxis   ?  ? ?Pertinent Vitals/Pain Pain Assessment ?Pain Assessment: No/denies pain ?Faces Pain Scale: No hurt ?Pain Intervention(s): Monitored during session  ? ? ? ?Hand Dominance Left ?  ?Extremity/Trunk Assessment Upper Extremity Assessment ?Upper Extremity Assessment: LUE deficits/detail ?LUE Deficits / Details: s/p reverse TSA, mild edema noted. AROM of elbow, wrist and hand limited due to edema ?LUE Sensation: decreased light touch (4 fingers) ?LUE Coordination: decreased fine motor ?  ?Lower Extremity Assessment ?Lower Extremity Assessment: Defer to PT evaluation ?  ?Cervical / Trunk Assessment ?Cervical / Trunk Assessment: Other exceptions ?Cervical / Trunk Exceptions: increased body habitus ?  ?Communication Communication ?Communication: HOH ?  ?Cognition Arousal/Alertness:  Awake/alert ?Behavior During Therapy: Flat affect ?Overall Cognitive Status: Within Functional Limits for tasks assessed ?  ?  ?  ?  ?  ?  ?  ?  ?  ?  ?  ?  ?  ?  ?  ?  ?General Comments: no family present to determine if true baseline ?  ?  ?General Comments    ? ?  ?Exercises Exercises: Shoulder, Other exercises ?Shoulder Exercises ?Pendulum Exercise: Left, 10 reps, PROM ?Elbow Flexion: AAROM, 10 reps, Seated (able to bend to bottom of sternum only) ?Elbow Extension: AAROM, Left, 10 reps, Seated ?Wrist Flexion: AAROM, Left, 10 reps, Seated (limited by edema) ?Wrist Extension: AAROM, Left, 10 reps, Seated (limited by edema) ?Digit Composite Flexion: Left, 10 reps, Seated (limited by edema - reports numbness in 4 fingers) ?Composite Extension: AAROM, Left, 10 reps, Seated (limited by edema) ?Other Exercises ?Other Exercises: lap slides x10 ?  ?Shoulder Instructions    ? ? ?Home Living Family/patient expects to be discharged to:: Private residence ?Living Arrangements: Spouse/significant other ?Available Help at Discharge: Family;Available 24 hours/day ?Type of Home: House ?Home Access: Stairs to enter ?Entrance Stairs-Number of Steps: 5-6 ?Entrance Stairs-Rails: Left ?Home Layout: One level ?  ?  ?Bathroom Shower/Tub: Walk-in shower ?  ?Bathroom Toilet: Standard ?  ?  ?Home Equipment: Shower seat;Cane - single point ?  ?  ?  ? ?  ?Prior Functioning/Environment Prior Level of Function : Needs assist ?  ?  ?  ?  ?  ?  ?  Mobility Comments: has been using cane in the house; has trouble descending steps at home due to railing on L. needs assist with stair navigation since L shoulder sx ?ADLs Comments: prior to TSA: indep, drives, takes care of the lawn(drives big tractor), community ambulator ?  ? ?  ?  ?OT Problem List: Decreased strength;Decreased range of motion;Decreased activity tolerance;Impaired balance (sitting and/or standing);Impaired UE functional use;Decreased knowledge of precautions;Obesity;Increased edema ?   ?   ?OT Treatment/Interventions: Self-care/ADL training;Therapeutic exercise;Therapeutic activities;Patient/family education;Balance training  ?  ?OT Goals(Current goals can be found in the care plan section) Acut

## 2021-10-29 NOTE — Progress Notes (Signed)
Patient ID: Gregory Blevins, male   DOB: Jun 23, 1941, 81 y.o.   MRN: [id number] ? ? ? Progress Note ? ? Subjective  ?Day #3 ?CC; GI bleed/melena, failure to thrive postop left shoulder surgery ? ?Grade D esophagitis, 1 large nonbleeding deep cratered duodenal ulcer, no overlying clot or visible vessel ? ?Biopsies pending ? ?CBC this a.m./WBC 12.7 trending down, hemoglobin 7.4/hematocrit 23.1-stable since yesterday ?BUN 31/creatinine 1.25 ? ?Sitting up in chair, says he is feeling a bit better, tolerating full liquids and taking most of the tray which is definite improvement over his intake at home ?Complaints of abdominal pain ?No nausea ?Did have a large black bowel movement this morning, had not had a bowel movement for few days ? ? ? Objective  ? ?Vital signs in last 24 hours: ?Temp:  [97.7 ?F (36.5 ?C)-98.3 ?F (36.8 ?C)] 98.2 ?F (36.8 ?C) (04/22 7517) ?Pulse Rate:  [64-78] 64 (04/22 0726) ?Resp:  [16-20] 18 (04/22 0726) ?BP: (127-159)/(46-79) 132/47 (04/22 0726) ?SpO2:  [96 %-99 %] 98 % (04/22 0726) ?Last BM Date : 10/27/21 ?General: Elderly white male in NAD, up in chair ?Heart:  Regular rate and rhythm; no murmurs ?Lungs: Respirations even and unlabored, lungs CTA bilaterally ?Abdomen:  Soft, nontender and nondistended. Normal bowel sounds. ?Extremities: Trace edema bilateral lower extremities, left upper extremity edematous ?Neurologic:  Alert and oriented,  grossly normal neurologically. ?Psych:  Cooperative. Normal mood and affect. ? ?Intake/Output from previous day: ?04/21 0701 - 04/22 0700 ?In: -  ?Out: 650 [Urine:650] ?Intake/Output this shift: ?Total I/O ?In: -  ?Out: 100 [Urine:100] ? ?Lab Results: ?Recent Labs  ?  10/27/21 ?1918 10/28/21 ?0024 10/28/21 ?1220 10/28/21 ?2316 10/29/21 ?0257  ?WBC 18.2* 18.8*  --   --  12.7*  ?HGB 8.8* 8.7* 8.1* 7.5* 7.4*  ?HCT 26.8* 27.5* 25.2* 22.6* 23.1*  ?PLT 190 201  --   --  158  ? ?BMET ?Recent Labs  ?  10/27/21 ?0110 10/28/21 ?0017 10/29/21 ?4944  ?NA 137  135  138 137  ?K 4.8  4.8 4.5 4.1  ?CL 114*  111 112* 113*  ?CO2 17*  18* 21* 21*  ?GLUCOSE 108*  111* 95 96  ?BUN 85*  84* 58* 31*  ?CREATININE 1.40*  1.44* 1.42* 1.25*  ?CALCIUM 7.3*  7.5* 7.9* 7.5*  ? ?LFT ?Recent Labs  ?  10/27/21 ?0110  ?PROT 4.5*  ?ALBUMIN 2.3*  ?AST 19  ?ALT 19  ?ALKPHOS 38  ?BILITOT 0.8  ? ?PT/INR ?No results for input(s): LABPROT, INR in the last 72 hours. ? ?Studies/Results: ?ECHOCARDIOGRAM COMPLETE ? ?Result Date: 10/27/2021 ?   ECHOCARDIOGRAM REPORT   Patient Name:   Gregory Blevins Date of Exam: 10/27/2021 Medical Rec #:  [id number]         Height:       67.0 in Accession #:    [id number]        Weight:       224.9 lb Date of Birth:  1941-02-11        BSA:          2.126 m? Patient Age:    20 years          BP:           139/39 mmHg Patient Gender: M                 HR:           70 bpm. Exam Location:  Inpatient Procedure: 2D  Echo, Intracardiac Opacification Agent, Color Doppler and Cardiac            Doppler Indications:    Dyspnea  History:        Patient has no prior history of Echocardiogram examinations.                 Risk Factors:Hypertension.  Sonographer:    Clayton Lefort RDCS (AE) Referring Phys: [place]  Sonographer Comments: Technically difficult study due to poor echo windows. IMPRESSIONS  1. Left ventricular ejection fraction, by estimation, is 60 to 65%. The left ventricle has normal function. The left ventricle has no regional wall motion abnormalities. There is mild concentric left ventricular hypertrophy. Left ventricular diastolic parameters are consistent with Grade I diastolic dysfunction (impaired relaxation).  2. Right ventricular systolic function is normal. The right ventricular size is normal.  3. The mitral valve is normal in structure. No evidence of mitral valve regurgitation. No evidence of mitral stenosis.  4. The aortic valve was not well visualized. Aortic valve regurgitation is not visualized. No aortic stenosis is present.  5. The  inferior vena cava is normal in size with greater than 50% respiratory variability, suggesting right atrial pressure of 3 mmHg. Comparison(s): No prior Echocardiogram. FINDINGS  Left Ventricle: Left ventricular ejection fraction, by estimation, is 60 to 65%. The left ventricle has normal function. The left ventricle has no regional wall motion abnormalities. Definity contrast agent was given IV to delineate the left ventricular  endocardial borders. The left ventricular internal cavity size was normal in size. There is mild concentric left ventricular hypertrophy. Left ventricular diastolic parameters are consistent with Grade I diastolic dysfunction (impaired relaxation). Right Ventricle: The right ventricular size is normal. No increase in right ventricular wall thickness. Right ventricular systolic function is normal. Left Atrium: Left atrial size was normal in size. Right Atrium: Right atrial size was normal in size. Pericardium: There is no evidence of pericardial effusion. Mitral Valve: The mitral valve is normal in structure. No evidence of mitral valve regurgitation. No evidence of mitral valve stenosis. Tricuspid Valve: The tricuspid valve is normal in structure. Tricuspid valve regurgitation is not demonstrated. No evidence of tricuspid stenosis. Aortic Valve: The aortic valve was not well visualized. Aortic valve regurgitation is not visualized. No aortic stenosis is present. Aortic valve mean gradient measures 3.0 mmHg. Aortic valve peak gradient measures 6.7 mmHg. Aortic valve area, by VTI measures 3.76 cm?. Pulmonic Valve: The pulmonic valve was not well visualized. Pulmonic valve regurgitation is not visualized. Aorta: The aortic root and ascending aorta are structurally normal, with no evidence of dilitation. Venous: The inferior vena cava is normal in size with greater than 50% respiratory variability, suggesting right atrial pressure of 3 mmHg. IAS/Shunts: No atrial level shunt detected by color  flow Doppler.  LEFT VENTRICLE PLAX 2D LVIDd:         4.40 cm   Diastology LVIDs:         3.20 cm   LV e' medial:    5.77 cm/s LV PW:         1.30 cm   LV E/e' medial:  12.7 LV IVS:        1.30 cm   LV e' lateral:   8.16 cm/s LVOT diam:     2.20 cm   LV E/e' lateral: 9.0 LV SV:         78 LV SV Index:   37 LVOT Area:     3.80 cm?  RIGHT VENTRICLE            IVC RV S prime:     8.66 cm/s  IVC diam: 1.80 cm TAPSE (M-mode): 1.6 cm LEFT ATRIUM             Index LA diam:        2.90 cm 1.36 cm/m? LA Vol (A2C):   28.9 ml 13.60 ml/m? LA Vol (A4C):   44.8 ml 21.08 ml/m? LA Biplane Vol: 36.9 ml 17.36 ml/m?  AORTIC VALVE AV Area (Vmax):    3.42 cm? AV Area (Vmean):   3.17 cm? AV Area (VTI):     3.76 cm? AV Vmax:           129.00 cm/s AV Vmean:          87.900 cm/s AV VTI:            0.207 m AV Peak Grad:      6.7 mmHg AV Mean Grad:      3.0 mmHg LVOT Vmax:         116.00 cm/s LVOT Vmean:        73.200 cm/s LVOT VTI:          0.205 m LVOT/AV VTI ratio: 0.99  AORTA Ao Root diam: 3.50 cm Ao Asc diam:  3.30 cm MITRAL VALVE MV Area (PHT): 3.76 cm?    SHUNTS MV Decel Time: 202 msec    Systemic VTI:  0.20 m MV E velocity: 73.30 cm/s  Systemic Diam: 2.20 cm MV A velocity: 70.70 cm/s MV E/A ratio:  1.04 Rudean Haskell MD Electronically signed by Rudean Haskell MD Signature Date/Time: 10/27/2021/5:31:18 PM    Final    ? ? ? ? Assessment / Plan:   ? ?#60 81 year old white male, status post left shoulder replacement 11 days ago, failure to thrive since surgery, then onset of melena and mild hypotension dictating admission ? ?On admit noted to have 5 g drop in hemoglobin  ? ?EGD-10/27/2021 with finding of severe grade D esophagitis, and 1 deep cratered nonbleeding duodenal ulcer, no visible vessel and no overlying clot ? ?He has been stable, no active bleeding, appetite improving ? ?#2 anemia secondary to above-transfused x2, hemoglobin stable overnight though still quite anemic ? ?#3 leukocytosis improved ?#4 acute kidney injury  stable ?#5 debilitation  secondary to above ? ?Plan; complete 72 hours of IV PPI, then convert to oral Protonix 40 mg twice daily she will need for at least 8 weeks, then plan 40 mg daily thereafter ?Continue to trend

## 2021-10-30 DIAGNOSIS — K269 Duodenal ulcer, unspecified as acute or chronic, without hemorrhage or perforation: Secondary | ICD-10-CM

## 2021-10-30 DIAGNOSIS — K922 Gastrointestinal hemorrhage, unspecified: Secondary | ICD-10-CM | POA: Diagnosis not present

## 2021-10-30 DIAGNOSIS — N179 Acute kidney failure, unspecified: Secondary | ICD-10-CM | POA: Diagnosis not present

## 2021-10-30 LAB — CBC WITH DIFFERENTIAL/PLATELET
Abs Immature Granulocytes: 0.21 10*3/uL — ABNORMAL HIGH (ref 0.00–0.07)
Basophils Absolute: 0.1 10*3/uL (ref 0.0–0.1)
Basophils Relative: 0 %
Eosinophils Absolute: 0.2 10*3/uL (ref 0.0–0.5)
Eosinophils Relative: 1 %
HCT: 28.1 % — ABNORMAL LOW (ref 39.0–52.0)
Hemoglobin: 9 g/dL — ABNORMAL LOW (ref 13.0–17.0)
Immature Granulocytes: 1 %
Lymphocytes Relative: 15 %
Lymphs Abs: 2.4 10*3/uL (ref 0.7–4.0)
MCH: 29.5 pg (ref 26.0–34.0)
MCHC: 32 g/dL (ref 30.0–36.0)
MCV: 92.1 fL (ref 80.0–100.0)
Monocytes Absolute: 0.8 10*3/uL (ref 0.1–1.0)
Monocytes Relative: 5 %
Neutro Abs: 12 10*3/uL — ABNORMAL HIGH (ref 1.7–7.7)
Neutrophils Relative %: 78 %
Platelets: 192 10*3/uL (ref 150–400)
RBC: 3.05 MIL/uL — ABNORMAL LOW (ref 4.22–5.81)
RDW: 14.4 % (ref 11.5–15.5)
WBC: 15.6 10*3/uL — ABNORMAL HIGH (ref 4.0–10.5)
nRBC: 0 % (ref 0.0–0.2)

## 2021-10-30 LAB — TYPE AND SCREEN
ABO/RH(D): O NEG
Antibody Screen: NEGATIVE
Unit division: 0
Unit division: 0
Unit division: 0

## 2021-10-30 LAB — MAGNESIUM: Magnesium: 2 mg/dL (ref 1.7–2.4)

## 2021-10-30 LAB — BPAM RBC
Blood Product Expiration Date: 202305252359
Blood Product Expiration Date: 202305252359
Blood Product Expiration Date: 202305262359
ISSUE DATE / TIME: 202304191826
ISSUE DATE / TIME: 202304192055
ISSUE DATE / TIME: 202304221449
Unit Type and Rh: 5100
Unit Type and Rh: 5100
Unit Type and Rh: 5100

## 2021-10-30 LAB — COMPREHENSIVE METABOLIC PANEL
ALT: 19 U/L (ref 0–44)
AST: 21 U/L (ref 15–41)
Albumin: 2.8 g/dL — ABNORMAL LOW (ref 3.5–5.0)
Alkaline Phosphatase: 49 U/L (ref 38–126)
Anion gap: 4 — ABNORMAL LOW (ref 5–15)
BUN: 23 mg/dL (ref 8–23)
CO2: 24 mmol/L (ref 22–32)
Calcium: 7.9 mg/dL — ABNORMAL LOW (ref 8.9–10.3)
Chloride: 108 mmol/L (ref 98–111)
Creatinine, Ser: 1.21 mg/dL (ref 0.61–1.24)
GFR, Estimated: >60 mL/min (ref >60)
Glucose, Bld: 95 mg/dL (ref 70–99)
Potassium: 3.7 mmol/L (ref 3.5–5.1)
Sodium: 136 mmol/L (ref 135–145)
Total Bilirubin: 0.7 mg/dL (ref 0.3–1.2)
Total Protein: 5.1 g/dL — ABNORMAL LOW (ref 6.5–8.1)

## 2021-10-30 MED ORDER — ASPIRIN 81 MG PO TBEC: 81.0000 mg | DELAYED_RELEASE_TABLET | Freq: Every day | ORAL | 0 refills | Status: DC

## 2021-10-30 MED ORDER — PANTOPRAZOLE SODIUM 40 MG PO TBEC: 40.0000 mg | DELAYED_RELEASE_TABLET | Freq: Two times a day (BID) | ORAL | 3 refills | Status: DC

## 2021-10-30 NOTE — Discharge Summary (Signed)
?                                                                                ? ?Gregory Blevins FKC:[id number] DOB: 1940-08-17 DOA: 10/26/2021 ? ?PCP: Tammi Sou, MD ? ?Admit date: 10/26/2021  Discharge date: 10/30/2021 ? ?Admitted From: Home   Disposition:  Home ? ? ?Recommendations for Outpatient Follow-up:  ? ?Follow up with PCP in 1-2 weeks ? ?PCP Please obtain BMP/CBC, 2 view CXR in 1week,  (see Discharge instructions)  ? ?PCP Please follow up on the following pending results: Monitor CBC closely needs close GI and orthopedics follow-up. ? ? ?Home Health: PT, OT if qulaifies   ?Equipment/Devices: as below  ?Consultations: GI, Ortho ?Discharge Condition: Stable    ?CODE STATUS: Full    ?Diet Recommendation: Heart Healthy  ? ?Diet Order   ? ?       ?  Diet - low sodium heart healthy       ?  ?  Diet full liquid Room service appropriate? Yes; Fluid consistency: Thin  Diet effective now       ?  ? ?  ?  ? ?  ?  ? ?Chief Complaint  ?Patient presents with  ? Hypotension  ?  ? ?Brief history of present illness from the day of admission and additional interim summary   ? ? 81 y.o.  male with history of s/p shoulder replacement on 4/10, HTN, CKD stage IIIa, chronic lower extremity edema-who presented with upper GI bleeding with acute blood loss anemia.   ?  ?Significant events: ?4/19>> presented with melanotic stools and acute blood loss anemia-s/p 2 units of PRBC transfusion. ?  ?Significant studies: ?4/19>> CXR: No PNA ?  ?Significant microbiology data: ?4/20>> blood culture: No growth ?  ?Procedures: ? ?4/20 >>EGD: Nonbleeding, large cratered duodenal ulcer with pigmentation. ? ?4/20 >> TTE - 1. Left ventricular ejection fraction, by estimation, is 60 to 65%. The left ventricle has normal function. The left ventricle has no regional wall motion abnormalities. There is mild concentric left ventricular hypertrophy. Left  ventricular diastolic parameters are consistent with Grade I diastolic dysfunction (impaired relaxation).  2. Right ventricular systolic function is normal. The right ventricular size is normal.  3. The mitral valve is normal in structure. No evidence of mitral valve regurgitation. No evidence of mitral stenosis.  4. The aortic valve was not well visualized. Aortic valve regurgitation is not visualized. No aortic stenosis is present.  5. The inferior vena cava is normal in size with greater than 50% respiratory variability, suggesting right atrial pressure of 3 mmHg. ? ? ?                                                               Hospital Course  ? ? ? Upper GI bleeding with acute blood loss anemia: Secondary to duodenal ulcer.  Hemoglobin stable overnight-no further bleeding-s/p 2 units of PRBC on 10/26/21 and 1 unit on  10/29/2021.  EGD on 10/27/21 - Nonbleeding, large cratered duodenal ulcer with pigmentation. recommendations from GI to continue PPI infusion x72 hours.  CBC remained stable the last 2 days received a unit of packed RBC yesterday as he was stable at 7.5 however had some renal function compromise, with 1 unit of packed RBC transfusion on 10/29/2021 in addition to 2 before his H&H is stable, he is completely symptom-free has been transitioned to twice daily PPI, will follow with PCP and GI within 7 to 10 days for follow-up.  Have requested him not to resume his aspirin till he is cleared to do so by GI. ?  ?AKI on CKD stage IIIa: AKI hemodynamically mediated-in the setting of blood loss-and hypotension now resolved.  Renal function back to baseline. ?  ?Mild hypotension: Due to hypovolemia/blood loss-BP stable this morning.  Now resolved. ? ?Leukocytosis: No sources of infection apparent-he does not have any signs symptoms consistent with an infection.  Orthopedics Orion Crook, [place]) examined left shoulder wound-no signs of infection.  Blood cultures remains negative.  Suspect could be reactive  from inflammation associated with duodenal ulcer.  ECP to recheck in 7 to 10 days.  Afebrile. ?  ?HTN: BP will resume home medications and follow with PCP ? ?HLD: Hold statins for now. ? ?Chronic lower extremity edema: Unclear etiology-UA negative for proteinuria-checking echo.  Furosemide at home dose to be resumed upon discharge. ? ?Recent left shoulder replacement: See above-continue supportive care.  PT/OT ordered home if he qualifies, sling taken out today by orthopedic team, orthopedic restrictions provided to the patient.  Follow-up with orthopedics in a week postdischarge. ?  ?BMI: Estimated body mass index is 35.22 kg/m? follow with PCP for weight loss ?  ? ? ?Discharge diagnosis   ? ? ?Principal Problem: ?  GI bleed ?Active Problems: ?  Duodenal ulcer ?  AKI (acute kidney injury) ([place]) ? ? ? ?Discharge instructions   ? ?Discharge Instructions   ? ? Diet - low sodium heart healthy   Complete by: As directed ?  ? Discharge instructions   Complete by: As directed ?  ? Do not resume aspirin till you are cleared to do so by your gastroenterologist.   ? ?Follow with Primary MD [name], [name], MD in 7 days  ? ?Get CBC, CMP -  checked next visit within 1 week by Primary MD, also follow-up with your gastroenterologist within a week and your orthopedic surgeon Dr. Marlou Sa in 7 to 10 days ? ?Activity: As tolerated with Full fall precautions use walker/cane & assistance as needed. ? ?No lifting more than 2 pounds.  Okay to use the arm for full active range of motion aside from going behind his back.  Leave Steri-Strips on until they fall off by themselves and okay for sponge bathing and showering but no submersion (bath, pool, hot tub).  He will disregard his upcoming appointment on Tuesday and follow-up with the office in about 2 weeks for clinical recheck with Dr. Marlou Sa and radiographs at that time ? ?Disposition Home  ? ?Diet: Heart Healthy  ? ?Special Instructions: If you have smoked or chewed Tobacco  in the last  2 yrs please stop smoking, stop any regular Alcohol  and or any Recreational drug use. ? ?On your next visit with your primary care physician please Get Medicines reviewed and adjusted. ? ?Please request your Prim.MD to go over all Hospital Tests and Procedure/Radiological results at the follow up, please get all Hospital records sent  to your Prim MD by signing hospital release before you go home. ? ?If you experience worsening of your admission symptoms, develop shortness of breath, life threatening emergency, suicidal or homicidal thoughts you must seek medical attention immediately by calling 911 or calling your MD immediately  if symptoms less severe. ? ?You Must read complete instructions/literature along with all the possible adverse reactions/side effects for all the Medicines you take and that have been prescribed to you. Take any new Medicines after you have completely understood and accpet all the possible adverse reactions/side effects.  ? Discharge wound care:   Complete by: As directed ?  ? Kindly keep postoperative site on the left shoulder clean and dry at all times, follow instructions given by orthopedic surgeon previously unchanged.  ? Increase activity slowly   Complete by: As directed ?  ? ?  ? ? ?Discharge Medications  ? ?Allergies as of 10/30/2021   ?No Known Allergies ?  ? ?  ?Medication List  ?  ? ?TAKE these medications   ? ?acetaminophen 500 MG tablet ?Commonly known as: TYLENOL ?Take 500-1,000 mg by mouth in the morning and at bedtime. ?  ?aspirin 81 MG EC tablet ?Take 1 tablet (81 mg total) by mouth daily. Do not resume this medicine till cleared by a gastroenterologist to do so. ?Start taking on: Nov 14, 2021 ?What changed:  ?additional instructions ?These instructions start on Nov 14, 2021. If you are unsure what to do until then, ask your doctor or other care provider. ?  ?atorvastatin 20 MG tablet ?Commonly known as: LIPITOR ?Take 1 tablet (20 mg total) by mouth daily. ?   ?cholecalciferol 25 MCG (1000 UNIT) tablet ?Commonly known as: VITAMIN D ?Take 1,000 Units by mouth daily. ?  ?furosemide 20 MG tablet ?Commonly known as: LASIX ?1 tab po every other day ?What changed:  ?how much t

## 2021-10-30 NOTE — Discharge Instructions (Addendum)
Do not resume aspirin till you are cleared to do so by your gastroenterologist.   ? ?Follow with Primary MD [name], [name], MD in 7 days  ? ?Get CBC, CMP -  checked next visit within 1 week by Primary MD, also follow-up with your gastroenterologist within a week and your orthopedic surgeon Dr. Marlou Sa in 7 to 10 days ? ?Activity: As tolerated with Full fall precautions use walker/cane & assistance as needed. ? ?No lifting more than 2 pounds.  Okay to use the arm for full active range of motion aside from going behind his back.  Leave Steri-Strips on until they fall off by themselves and okay for sponge bathing and showering but no submersion (bath, pool, hot tub).  He will disregard his upcoming appointment on Tuesday and follow-up with the office in about 2 weeks for clinical recheck with Dr. Marlou Sa and radiographs at that time ? ?Disposition Home  ? ?Diet: Heart Healthy  ? ?Special Instructions: If you have smoked or chewed Tobacco  in the last 2 yrs please stop smoking, stop any regular Alcohol  and or any Recreational drug use. ? ?On your next visit with your primary care physician please Get Medicines reviewed and adjusted. ? ?Please request your Prim.MD to go over all Hospital Tests and Procedure/Radiological results at the follow up, please get all Hospital records sent to your Prim MD by signing hospital release before you go home. ? ?If you experience worsening of your admission symptoms, develop shortness of breath, life threatening emergency, suicidal or homicidal thoughts you must seek medical attention immediately by calling 911 or calling your MD immediately  if symptoms less severe. ? ?You Must read complete instructions/literature along with all the possible adverse reactions/side effects for all the Medicines you take and that have been prescribed to you. Take any new Medicines after you have completely understood and accpet all the possible adverse reactions/side effects.  ? ?  ? ?

## 2021-10-30 NOTE — TOC Transition Note (Signed)
Transition of Care (TOC) - CM/SW Discharge Note ? ? ?Patient Details  ?Name: Gregory Blevins ?MRN: [id number] ?Date of Birth: 1940/09/03 ? ?Transition of Care (TOC) CM/SW Contact:  ?Carles Collet, RN ?Phone Number: ?10/30/2021, 8:44 AM ? ? ?Clinical Narrative:   Patient to DC to home w wife. ?Patient active w Alvis Lemmings with [facility] services. Requested HH order from MD and notified liaison of DC today. ?No other TOC needs identified for DC.  ? ? ? ?Final next level of care: [place] ?Barriers to Discharge: No Barriers Identified ? ? ?Patient Goals and CMS Choice ?Patient states their goals for this hospitalization and ongoing recovery are:: to go home ?CMS Medicare.gov Compare Post Acute Care list provided to:: Patient ?Choice offered to / list presented to : Patient ? ?Discharge Placement ?  ?           ?  ?  ?  ?  ? ?Discharge Plan and Services ?  ?  ?           ?  ?  ?  ?  ?  ?HH Arranged: PT ?[place] Agency: [place] ?Date HH Agency Contacted: 10/30/21 ?Time [place]: [phone] ?Representative spoke with at [place]: Tommi Rumps ? ?Social Determinants of Health (SDOH) Interventions ?  ? ? ?Readmission Risk Interventions ?   ? View : No data to display.  ?  ?  ?  ? ? ? ? ? ?

## 2021-10-31 ENCOUNTER — Telehealth: Payer: Self-pay

## 2021-10-31 ENCOUNTER — Encounter: Payer: Self-pay | Admitting: Family Medicine

## 2021-10-31 LAB — SURGICAL PATHOLOGY

## 2021-10-31 NOTE — Telephone Encounter (Signed)
Pt already scheduled for OV with Dr. Hilarie Fredrickson 5/25@8 :50am. EGD scheduled in the [facility] 01/02/22 at 10am. ?

## 2021-10-31 NOTE — Telephone Encounter (Signed)
-----   Message from Jerene Bears, MD sent at 10/28/2021  1:56 PM EDT ----- ?Thanks Joelene Millin ?Ulice Dash ? ?----- Message ----- ?From: [place], MD ?Sent: 10/28/2021  12:04 PM EDT ?To: Alfredia Ferguson, PA-C, Algernon Huxley, RN, # ? ?Mr. Shammas has been admitted with a GI bleed related to erosive esophagitis and a large duodenal ulcer. He has been using an aspirin for DVT prophylaxis following recent shoulder surgery. Gastric biopsies are pending.  ? ?I recommend: ?(1) Office visit with Dr. Hilarie Fredrickson or an APP in 3-4 weeks ?(2) EGD in 8-10 weeks with Dr. Hilarie Fredrickson to document healing ? ?Thank you ? ?[facility] ? ? ?

## 2021-10-31 NOTE — Telephone Encounter (Signed)
Noted: nurse phone contact with patient for TCM. ?Signed:  Crissie Sickles, MD           10/31/2021 ? ?

## 2021-10-31 NOTE — Telephone Encounter (Signed)
Transition Care Management Follow-up Telephone Call ?Date of discharge and from where: [facility] 10/30/21 ?How have you been since you were released from the hospital? fine ?Any questions or concerns? No ? ?Items Reviewed: ?Did the pt receive and understand the discharge instructions provided? Yes  ?Medications obtained and verified? Yes  ?Other? No  ?Any new allergies since your discharge? No  ?Dietary orders reviewed? Yes ?Do you have support at home? Yes  ? ?Home Care and Equipment/Supplies: ?Were home health services ordered? yes ?If so, what is the name of the agency? [facility]  ?Has the agency set up a time to come to the patient's home? yes ?Were any new equipment or medical supplies ordered?  No ?What is the name of the medical supply agency?  ?Were you able to get the supplies/equipment? not applicable ?Do you have any questions related to the use of the equipment or supplies? No ? ?Functional Questionnaire: (I = Independent and D = Dependent) ?ADLs: I ? ?Bathing/Dressing- I ? ?Meal Prep- I ? ?Eating- I ? ?Maintaining continence- I ? ?Transferring/Ambulation- I ? ?Managing Meds- I ? ?Follow up appointments reviewed: ? ?PCP Hospital f/u appt confirmed? Yes  Scheduled to see DR [name]  on 11/04/21 @ 11:20. ?[facility] f/u appt confirmed? Yes  Scheduled to see Post Op on 11/17/21 @ 9:15. ?Are transportation arrangements needed? No  ?If their condition worsens, is the pt aware to call PCP or go to the Emergency Dept.? Yes ?Was the patient provided with contact information for the PCP's office or ED? Yes ?Was to pt encouraged to call back with questions or concerns? Yes ? ?

## 2021-11-01 ENCOUNTER — Encounter: Payer: Self-pay | Admitting: Gastroenterology

## 2021-11-01 ENCOUNTER — Encounter: Payer: Medicare Other | Admitting: Surgical

## 2021-11-01 DIAGNOSIS — Z471 Aftercare following joint replacement surgery: Secondary | ICD-10-CM | POA: Diagnosis not present

## 2021-11-01 DIAGNOSIS — M170 Bilateral primary osteoarthritis of knee: Secondary | ICD-10-CM | POA: Diagnosis not present

## 2021-11-01 DIAGNOSIS — I10 Essential (primary) hypertension: Secondary | ICD-10-CM | POA: Diagnosis not present

## 2021-11-01 DIAGNOSIS — M160 Bilateral primary osteoarthritis of hip: Secondary | ICD-10-CM | POA: Diagnosis not present

## 2021-11-01 DIAGNOSIS — M19011 Primary osteoarthritis, right shoulder: Secondary | ICD-10-CM | POA: Diagnosis not present

## 2021-11-01 DIAGNOSIS — M47816 Spondylosis without myelopathy or radiculopathy, lumbar region: Secondary | ICD-10-CM | POA: Diagnosis not present

## 2021-11-01 LAB — CULTURE, BLOOD (ROUTINE X 2)
Culture: NO GROWTH
Culture: NO GROWTH
Special Requests: ADEQUATE

## 2021-11-03 ENCOUNTER — Telehealth: Payer: Self-pay

## 2021-11-03 ENCOUNTER — Telehealth: Payer: Self-pay | Admitting: Orthopedic Surgery

## 2021-11-03 DIAGNOSIS — M47816 Spondylosis without myelopathy or radiculopathy, lumbar region: Secondary | ICD-10-CM | POA: Diagnosis not present

## 2021-11-03 DIAGNOSIS — M160 Bilateral primary osteoarthritis of hip: Secondary | ICD-10-CM | POA: Diagnosis not present

## 2021-11-03 DIAGNOSIS — M19011 Primary osteoarthritis, right shoulder: Secondary | ICD-10-CM | POA: Diagnosis not present

## 2021-11-03 DIAGNOSIS — I10 Essential (primary) hypertension: Secondary | ICD-10-CM | POA: Diagnosis not present

## 2021-11-03 DIAGNOSIS — Z471 Aftercare following joint replacement surgery: Secondary | ICD-10-CM | POA: Diagnosis not present

## 2021-11-03 DIAGNOSIS — M170 Bilateral primary osteoarthritis of knee: Secondary | ICD-10-CM | POA: Diagnosis not present

## 2021-11-03 NOTE — Telephone Encounter (Signed)
This was addressed in another message.

## 2021-11-03 NOTE — Telephone Encounter (Signed)
[facility] called today [phone] pt is s/p a reverse total shoulder 10/17/21 was recently in the hospital for a GI bleed and pt states that while he was there ortho PA came by and advised he did not need to waer his sling any longer and that he is allowed to reach around to his back? PT asking for clarification on lifting and rom restrictions. Would like to see the pt 2 x q wk x 3 wks then 1 x q wk x 4 weeks. Please call to advise.  ?

## 2021-11-03 NOTE — Telephone Encounter (Signed)
Gregory Blevins with [facility] health called and is requesting orders. 1 time a week for 1 week, two times a week for 3 weeks, and then 1 time a week for 3 weeks.  ?She also would like to know what his ROM is and restrictions for the L shoulder. ?  ?CB [phone] ?

## 2021-11-03 NOTE — Telephone Encounter (Signed)
Okay to discontinue sling as I told him.  No lifting more than 5 pounds.  Okay for full range of motion but no external rotation past 30 degrees and no reaching behind his back

## 2021-11-03 NOTE — Discharge Summary (Signed)
Physician Discharge Summary  ? ? ? ? ?Patient ID: ?Gregory Blevins ?MRN: [id number] ?DOB/AGE: 1940/09/21 81 y.o. ? ?Admit date: 10/17/2021 ?Discharge date: 10/18/2021 ? ?Admission Diagnoses:  ?Principal Problem: ?  S/P reverse total shoulder arthroplasty, left ? ? ?Discharge Diagnoses:  ?Same ? ?Surgeries: Procedure(s): ?LEFT REVERSE SHOULDER ARTHROPLASTY on 10/17/2021 ?  ?Consultants:  ? ?Discharged Condition: Stable ? ?Hospital Course: Gregory Blevins is an 81 y.o. 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  They were brought to the operating room on 10/17/2021 and underwent the above named procedures.  Pt awoke from anesthesia without complication and was transferred to the floor. On POD1, patient's pain was well controlled.  He is able to mobilize well with physical therapy.  He had no complaint of chest pain, shortness of breath, abdominal pain, dizziness, lightheadedness on POD 1.  He was discharged home on POD 1.  Pt will f/u with Dr. Marlou Sa in clinic in ~2 weeks.  ? ?Antibiotics given:  ?Anti-infectives (From admission, onward)  ? ? Start     Dose/Rate Route Frequency Ordered Stop  ? 10/17/21 2000  ceFAZolin (ANCEF) IVPB 2g/100 mL premix  Status:  Discontinued       ? 2 g ?200 mL/hr over 30 Minutes Intravenous Every 8 hours 10/17/21 1540 10/18/21 1836  ? 10/17/21 1309  vancomycin (VANCOCIN) powder  Status:  Discontinued       ?   As needed 10/17/21 1309 10/17/21 1441  ? 10/17/21 0930  ceFAZolin (ANCEF) IVPB 2g/100 mL premix       ? 2 g ?200 mL/hr over 30 Minutes Intravenous On call to O.R. 10/17/21 0927 10/17/21 1120  ? ?  ?. ? ?Recent vital signs:  ?Vitals:  ? 10/18/21 0403 10/18/21 0755  ?BP: 124/69 (!) 110/56  ?Pulse: 78 79  ?Resp: 18 17  ?Temp: (!) 97.5 ?F (36.4 ?C) 98.1 ?F (36.7 ?C)  ?SpO2: 97% 95%  ? ? ?Recent laboratory studies:  ?Results for orders placed or performed during the hospital encounter of  10/17/21  ?CBC  ?Result Value Ref Range  ? WBC 17.7 (H) 4.0 - 10.5 K/uL  ? RBC 4.40 4.22 - 5.81 MIL/uL  ? Hemoglobin 13.3 13.0 - 17.0 g/dL  ? HCT 41.1 39.0 - 52.0 %  ? MCV 93.4 80.0 - 100.0 fL  ? MCH 30.2 26.0 - 34.0 pg  ? MCHC 32.4 30.0 - 36.0 g/dL  ? RDW 12.2 11.5 - 15.5 %  ? Platelets 159 150 - 400 K/uL  ? nRBC 0.0 0.0 - 0.2 %  ?Basic metabolic panel  ?Result Value Ref Range  ? Sodium 135 135 - 145 mmol/L  ? Potassium 4.9 3.5 - 5.1 mmol/L  ? Chloride 104 98 - 111 mmol/L  ? CO2 20 (L) 22 - 32 mmol/L  ? Glucose, Bld 189 (H) 70 - 99 mg/dL  ? BUN 36 (H) 8 - 23 mg/dL  ? Creatinine, Ser 1.52 (H) 0.61 - 1.24 mg/dL  ? Calcium 8.3 (L) 8.9 - 10.3 mg/dL  ? GFR, Estimated 46 (L) >60 mL/min  ? Anion gap 11 5 - 15  ? ? ?Discharge Medications:   ?Allergies as of 10/18/2021   ?No Known Allergies ?  ? ?  ?Medication List  ?  ? ?TAKE these medications   ? ?acetaminophen 500 MG tablet ?Commonly known as: TYLENOL ?Take 500-1,000 mg by mouth in the morning and at  bedtime. ?  ?atorvastatin 20 MG tablet ?Commonly known as: LIPITOR ?Take 1 tablet (20 mg total) by mouth daily. ?  ?cholecalciferol 25 MCG (1000 UNIT) tablet ?Commonly known as: VITAMIN D ?Take 1,000 Units by mouth daily. ?  ?furosemide 20 MG tablet ?Commonly known as: LASIX ?1 tab po every other day ?What changed:  ?how much to take ?how to take this ?when to take this ?additional instructions ?  ?lisinopril 30 MG tablet ?Commonly known as: ZESTRIL ?Take 1 tablet (30 mg total) by mouth daily. ?  ?meclizine 25 MG tablet ?Commonly known as: ANTIVERT ?Take 1-2 tablets (25-50 mg total) by mouth 3 (three) times daily as needed for dizziness. ?  ?ondansetron 8 MG disintegrating tablet ?Commonly known as: ZOFRAN-ODT ?Take 1 tablet (8 mg total) by mouth every 8 (eight) hours as needed for nausea or vomiting. ?  ?oxyCODONE 5 MG immediate release tablet ?Commonly known as: Oxy IR/ROXICODONE ?Take 1 tablet (5 mg total) by mouth every 4 (four) hours as needed for moderate pain (pain  score 4-6). ?  ? ?  ? ? ?Diagnostic Studies: DG Chest Portable 1 View ? ?Result Date: 10/26/2021 ?CLINICAL DATA:  Constipation and dark stool. EXAM: PORTABLE CHEST 1 VIEW COMPARISON:  None. FINDINGS: The heart size and mediastinal contours are within normal limits. Low lung volumes are noted with mild left infrahilar scarring and/or atelectasis. There is no evidence of a pleural effusion or pneumothorax. Marked severity degenerative changes seen involving the right shoulder. A radiopaque left shoulder replacement is seen. IMPRESSION: Low lung volumes with mild left infrahilar scarring and/or atelectasis. Electronically Signed   By: Virgina Norfolk M.D.   On: 10/26/2021 17:09  ? ?DG Shoulder Left Port ? ?Result Date: 10/17/2021 ?CLINICAL DATA:  Status post reverse left shoulder arthroplasty EXAM: LEFT SHOULDER COMPARISON:  08/24/2021 left shoulder radiographs FINDINGS: Status post reversed total left shoulder arthroplasty with no evidence of hardware fracture or loosening. No evidence of acromioclavicular separation or glenohumeral prosthetic dislocation. No suspicious focal osseous lesions. Chronic mild left acromioclavicular joint osteoarthritis. Expected soft tissue gas surrounding the left shoulder joint. IMPRESSION: Satisfactory immediate postoperative appearance status post reverse total left shoulder arthroplasty. Electronically Signed   By: Ilona Sorrel M.D.   On: 10/17/2021 15:44  ? ?ECHOCARDIOGRAM COMPLETE ? ?Result Date: 10/27/2021 ?   ECHOCARDIOGRAM REPORT   Patient Name:   Gregory Blevins Date of Exam: 10/27/2021 Medical Rec #:  [id number]         Height:       67.0 in Accession #:    [id number]        Weight:       224.9 lb Date of Birth:  [birth year]        BSA:          2.126 m? Patient Age:    5 years          BP:           139/39 mmHg Patient Gender: M                 HR:           70 bpm. Exam Location:  Inpatient Procedure: 2D Echo, Intracardiac Opacification Agent, Color Doppler and Cardiac             Doppler Indications:    Dyspnea  History:        Patient has no prior history of Echocardiogram examinations.  Risk Factors:Hypertension.  Sonographer:    Clayton Lefort RDCS (AE) Referring Phys: [place]  Sonographer Comments: Technically difficult study due to poor echo windows. IMPRESSIONS  1. Left ventricular ejection fraction, by estimation, is 60 to 65%. The left ventricle has normal function. The left ventricle has no regional wall motion abnormalities. There is mild concentric left ventricular hypertrophy. Left ventricular diastolic parameters are consistent with Grade I diastolic dysfunction (impaired relaxation).  2. Right ventricular systolic function is normal. The right ventricular size is normal.  3. The mitral valve is normal in structure. No evidence of mitral valve regurgitation. No evidence of mitral stenosis.  4. The aortic valve was not well visualized. Aortic valve regurgitation is not visualized. No aortic stenosis is present.  5. The inferior vena cava is normal in size with greater than 50% respiratory variability, suggesting right atrial pressure of 3 mmHg. Comparison(s): No prior Echocardiogram. FINDINGS  Left Ventricle: Left ventricular ejection fraction, by estimation, is 60 to 65%. The left ventricle has normal function. The left ventricle has no regional wall motion abnormalities. Definity contrast agent was given IV to delineate the left ventricular  endocardial borders. The left ventricular internal cavity size was normal in size. There is mild concentric left ventricular hypertrophy. Left ventricular diastolic parameters are consistent with Grade I diastolic dysfunction (impaired relaxation). Right Ventricle: The right ventricular size is normal. No increase in right ventricular wall thickness. Right ventricular systolic function is normal. Left Atrium: Left atrial size was normal in size. Right Atrium: Right atrial size was normal in size. Pericardium:  There is no evidence of pericardial effusion. Mitral Valve: The mitral valve is normal in structure. No evidence of mitral valve regurgitation. No evidence of mitral valve stenosis. Tricuspid Valve: The tricus

## 2021-11-04 ENCOUNTER — Ambulatory Visit (INDEPENDENT_AMBULATORY_CARE_PROVIDER_SITE_OTHER): Payer: Medicare Other | Admitting: Family Medicine

## 2021-11-04 ENCOUNTER — Encounter: Payer: Self-pay | Admitting: Family Medicine

## 2021-11-04 VITALS — BP 109/51 | HR 66 | Temp 98.4°F | Ht 67.0 in | Wt 229.4 lb

## 2021-11-04 DIAGNOSIS — I952 Hypotension due to drugs: Secondary | ICD-10-CM

## 2021-11-04 DIAGNOSIS — I10 Essential (primary) hypertension: Secondary | ICD-10-CM | POA: Diagnosis not present

## 2021-11-04 DIAGNOSIS — D62 Acute posthemorrhagic anemia: Secondary | ICD-10-CM

## 2021-11-04 DIAGNOSIS — K922 Gastrointestinal hemorrhage, unspecified: Secondary | ICD-10-CM

## 2021-11-04 LAB — CBC
HCT: 28.2 % — ABNORMAL LOW (ref 39.0–52.0)
Hemoglobin: 9.5 g/dL — ABNORMAL LOW (ref 13.0–17.0)
MCHC: 33.9 g/dL (ref 30.0–36.0)
MCV: 90.7 fl (ref 78.0–100.0)
Platelets: 324 10*3/uL (ref 150.0–400.0)
RBC: 3.11 Mil/uL — ABNORMAL LOW (ref 4.22–5.81)
RDW: 14.7 % (ref 11.5–15.5)
WBC: 10 10*3/uL (ref 4.0–10.5)

## 2021-11-04 LAB — BASIC METABOLIC PANEL
BUN: 20 mg/dL (ref 6–23)
CO2: 29 mEq/L (ref 19–32)
Calcium: 8.4 mg/dL (ref 8.4–10.5)
Chloride: 103 mEq/L (ref 96–112)
Creatinine, Ser: 1.03 mg/dL (ref 0.40–1.50)
GFR: 68.59 mL/min (ref >60.00)
Glucose, Bld: 84 mg/dL (ref 70–99)
Potassium: 4.4 mEq/L (ref 3.5–5.1)
Sodium: 138 mEq/L (ref 135–145)

## 2021-11-04 MED ORDER — LISINOPRIL 30 MG PO TABS: ORAL_TABLET | ORAL | 1 refills | Status: DC

## 2021-11-04 NOTE — Progress Notes (Signed)
?11/04/2021 ? ?CC:  ?Chief Complaint  ?Patient presents with  ? Hospitalization Follow-up  ? ? ?Patient is a 81 y.o. Caucasian male who presents accompanied by his wife for  hospital follow up, specifically [facility] face-to-face visit. ?Dates hospitalized: 10/26/2021 to 10/30/2021. ?Days since d/c from hospital:  5 days ?Patient was discharged from hospital to home. ?Reason for admission to hospital: Upper GI bleed with acute blood loss anemia. ?Date of interactive (phone) contact with patient and/or caregiver: 10/31/2021. ? ?I have reviewed patient's discharge summary plus pertinent specific notes, labs, and imaging from the hospitalization.   ?Gregory Blevins was having dizziness/lightheadedness, BP was low at home.  Went to ED, found to have melanotic stool, initial hemoglobin 8.1, Hemoccult positive. ?A nonbleeding duodenal ulcer was found on EGD 10/27/2021.  He was put on PPI infusion x72 hours he was transfused 3 total units of packed red blood cells. ?His dizziness resolved hemoglobin was stable at 9.0 on the day of discharge.  He was discharged home on twice daily PPI.  He will stay off of his aspirin until cleared by GI to resume this. ?Note he did have acute kidney injury but renal function returned to baseline prior to discharge.  He did have some leukocytosis as well but no symptoms of infection.  Blood cultures negative. ?Due to lower extremity swelling the inpatient team did an echocardiogram which showed EF 60-65% and grade 1 diastolic dysfunction, valves normal.   ? ?Currently: ?Gregory Blevins says he feels well other than the left arm swelling that has been present since his left total shoulder replacement for 10/17/2021.  He is getting PT for this. ?No dizziness.  Stools are brown.  Home health has checked blood pressure on 2 occasions since he went home from the hospital and wife and patient do not know the numbers but were told that they were "okay ". ?He has been taking his lisinopril 30 mg daily and  furosemide 20 mg every other day. ?His legs are still pretty swollen and he is wearing compression stockings now. ?Low-sodium diet. ? ?Medication reconciliation was done today and patient is taking meds as recommended by discharging hospitalist/specialist.   ? ?ROS as above, plus--> no fevers, no CP, no SOB, no wheezing, no cough, no dizziness, no HAs, no rashes, no melena/hematochezia.  No polyuria or polydipsia.  No myalgias or arthralgias.  No focal weakness, paresthesias, or tremors.  No acute vision or hearing abnormalities.  No dysuria or unusual/new urinary urgency or frequency.   ?No n/v/d or abd pain.  No palpitations.   ? ?PMH:  ?Past Medical History:  ?Diagnosis Date  ? Chronic left shoulder pain   ? 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 Diverticulosis 2013  ? noted on colonoscopy  ? Fatty liver 05/2012  ? u/s done for mild/persistent elevation of LFTs  ? Hearing impairment   ? 11/2015 Audiology eval= not yet ready for hearing aids ([facility])  ? History of adenomatous polyp of colon   ? On multiple colonoscopies: most recent colonoscopy 05/31/18-->adenoma x 2, repeat 3-5 ys is optional.  ? History of basal cell carcinoma of skin   ? HTN (hypertension)   ? Hyperlipidemia   ? Lumbar spondylosis 04/2013  ? 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  ? Microhematuria 09/2017  ? On dipstick only.  Urine micro 09/2017 and 04/2018-->no RBCs.  ? OAB (overactive bladder)   ? ditropan xl 10 ineffective--pt d/c'd this 09/2018  ? Obesity   ?  Osteoarthritis   ? Hips (L>R on x-ray 04/2013), shoulders, and knees (hx of adhesive capsulitis of shoulder)  ? Prediabetes   ? A1c 6.1% 08/2019 at [facility]  ? Rectal leakage   ? UGI bleed 10/26/2021  ? duodenal ulcer-->transfused 2 U pRBCs  ? ? ?PSH:  ?Past Surgical History:  ?Procedure Laterality Date  ? BIOPSY  10/27/2021  ? Procedure: BIOPSY;  Surgeon: Thornton Park, MD;  Location: [facility] ENDOSCOPY;  Service: Gastroenterology;;  ? COLONOSCOPY    ? 2008  w/polyps,  09-22-2011 w/polyps.  01/2015 tubular adenoma x 1.  05/31/18 colonoscopy w/ two tubular adenomas.  ? COLONOSCOPY W/ POLYPECTOMY  2008; 09/22/11;01/2015  ? Tubular adenoma--recall 3 yrs per Dr. Louanne Belton at [place].  Then switched to Dr. Hilarie Fredrickson with [place].  ? ESOPHAGOGASTRODUODENOSCOPY  10/27/2021  ? duodenal ulcer, h pylori NEG  ? ESOPHAGOGASTRODUODENOSCOPY (EGD) WITH PROPOFOL N/A 10/27/2021  ? Procedure: ESOPHAGOGASTRODUODENOSCOPY (EGD) WITH PROPOFOL;  Surgeon: Thornton Park, MD;  Location: [place];  Service: Gastroenterology;  Laterality: N/A;  ? POLYPECTOMY    ? REVERSE SHOULDER ARTHROPLASTY Left 10/17/2021  ? Procedure: LEFT REVERSE SHOULDER ARTHROPLASTY;  Surgeon: Meredith Pel, MD;  Location: [place];  Service: Orthopedics;  Laterality: Left;  ? TONSILLECTOMY    ? TRANSTHORACIC ECHOCARDIOGRAM  10/27/2021  ? 10/2021: grd I DD, o/w normal  ? UMBILICAL HERNIA REPAIR  2011  ? WISDOM TOOTH EXTRACTION Bilateral   ? ? ?MEDS:  ?Outpatient Medications Prior to Visit  ?Medication Sig Dispense Refill  ? acetaminophen (TYLENOL) 500 MG tablet Take 500-1,000 mg by mouth in the morning and at bedtime.    ? atorvastatin (LIPITOR) 20 MG tablet Take 1 tablet (20 mg total) by mouth daily. 90 tablet 1  ? cholecalciferol (VITAMIN D) 25 MCG (1000 UNIT) tablet Take 1,000 Units by mouth daily.    ? furosemide (LASIX) 20 MG tablet 1 tab po every other day (Patient taking differently: Take 20 mg by mouth every other day.) 45 tablet 3  ? pantoprazole (PROTONIX) 40 MG tablet Take 1 tablet (40 mg total) by mouth 2 (two) times daily before a meal. 60 tablet 3  ? senna (SENOKOT) 8.6 MG TABS tablet Take 1 tablet by mouth daily as needed for mild constipation or moderate constipation.    ? [START ON 11/14/2021] aspirin 81 MG EC tablet Take 1 tablet (81 mg total) by mouth daily. Do not resume this medicine till cleared by a gastroenterologist to do so. 30 tablet 0  ? lisinopril (ZESTRIL) 30 MG tablet Take 1 tablet  (30 mg total) by mouth daily. 90 tablet 1  ? oxyCODONE (OXY IR/ROXICODONE) 5 MG immediate release tablet Take 1 tablet (5 mg total) by mouth every 4 (four) hours as needed for moderate pain (pain score 4-6). 30 tablet 0  ? meclizine (ANTIVERT) 25 MG tablet Take 1-2 tablets (25-50 mg total) by mouth 3 (three) times daily as needed for dizziness. (Patient not taking: Reported on 11/04/2021) 30 tablet 0  ? ondansetron (ZOFRAN-ODT) 8 MG disintegrating tablet Take 1 tablet (8 mg total) by mouth every 8 (eight) hours as needed for nausea or vomiting. (Patient not taking: Reported on 11/04/2021) 10 tablet 1  ? ?No facility-administered medications prior to visit.  ? ?Physical Exam ? ?  11/04/2021  ? 11:20 AM 10/30/2021  ? 11:00 AM 10/30/2021  ?  7:29 AM  ?Vitals with BMI  ?Height 5' 7"     ?Weight 229 lbs 6 oz    ?BMI  35.92    ?Systolic 654 650 354  ?Diastolic 51 51 81  ?Pulse 66 74 66  ? ?Gen: Alert, well appearing.  Patient is oriented to person, place, time, and situation. ?AFFECT: pleasant, lucid thought and speech. ?CV: RRR, no m/r/g.   ?LUNGS: CTA bilat, nonlabored resps, good aeration in all lung fields. ?EXT: no clubbing or cyanosis.  3+ pitting edema palpable through his compression hose today.   ?Left arm with mild swelling all the way down into the hand.  Yellowish bruising from anterior shoulder dispersed down to about cubital fossa level. ? ?Pertinent labs/imaging ?Last CBC ?Lab Results  ?Component Value Date  ? WBC 15.6 (H) 10/30/2021  ? HGB 9.0 (L) 10/30/2021  ? HCT 28.1 (L) 10/30/2021  ? MCV 92.1 10/30/2021  ? MCH 29.5 10/30/2021  ? RDW 14.4 10/30/2021  ? PLT 192 10/30/2021  ? ?Last metabolic panel ?Lab Results  ?Component Value Date  ? GLUCOSE 95 10/30/2021  ? NA 136 10/30/2021  ? K 3.7 10/30/2021  ? CL 108 10/30/2021  ? CO2 24 10/30/2021  ? BUN 23 10/30/2021  ? CREATININE 1.21 10/30/2021  ? GFRNONAA >60 10/30/2021  ? CALCIUM 7.9 (L) 10/30/2021  ? PHOS 2.9 10/27/2021  ? PROT 5.1 (L) 10/30/2021  ? ALBUMIN 2.8 (L)  10/30/2021  ? BILITOT 0.7 10/30/2021  ? ALKPHOS 49 10/30/2021  ? AST 21 10/30/2021  ? ALT 19 10/30/2021  ? ANIONGAP 4 (L) 10/30/2021  ? ?Last vitamin D ?Lab Results  ?Component Value Date  ? VD25OH 61.1

## 2021-11-04 NOTE — Telephone Encounter (Signed)
IC no answer. LMVM with details per Runell Gess note.  ?

## 2021-11-06 DIAGNOSIS — M19012 Primary osteoarthritis, left shoulder: Secondary | ICD-10-CM

## 2021-11-07 DIAGNOSIS — M170 Bilateral primary osteoarthritis of knee: Secondary | ICD-10-CM | POA: Diagnosis not present

## 2021-11-07 DIAGNOSIS — Z471 Aftercare following joint replacement surgery: Secondary | ICD-10-CM | POA: Diagnosis not present

## 2021-11-07 DIAGNOSIS — M160 Bilateral primary osteoarthritis of hip: Secondary | ICD-10-CM | POA: Diagnosis not present

## 2021-11-07 DIAGNOSIS — I10 Essential (primary) hypertension: Secondary | ICD-10-CM | POA: Diagnosis not present

## 2021-11-07 DIAGNOSIS — M47816 Spondylosis without myelopathy or radiculopathy, lumbar region: Secondary | ICD-10-CM | POA: Diagnosis not present

## 2021-11-07 DIAGNOSIS — M19011 Primary osteoarthritis, right shoulder: Secondary | ICD-10-CM | POA: Diagnosis not present

## 2021-11-08 DIAGNOSIS — M47816 Spondylosis without myelopathy or radiculopathy, lumbar region: Secondary | ICD-10-CM | POA: Diagnosis not present

## 2021-11-08 DIAGNOSIS — I10 Essential (primary) hypertension: Secondary | ICD-10-CM | POA: Diagnosis not present

## 2021-11-08 DIAGNOSIS — Z471 Aftercare following joint replacement surgery: Secondary | ICD-10-CM | POA: Diagnosis not present

## 2021-11-08 DIAGNOSIS — M19011 Primary osteoarthritis, right shoulder: Secondary | ICD-10-CM | POA: Diagnosis not present

## 2021-11-08 DIAGNOSIS — M160 Bilateral primary osteoarthritis of hip: Secondary | ICD-10-CM | POA: Diagnosis not present

## 2021-11-08 DIAGNOSIS — M170 Bilateral primary osteoarthritis of knee: Secondary | ICD-10-CM | POA: Diagnosis not present

## 2021-11-09 ENCOUNTER — Telehealth: Payer: Self-pay

## 2021-11-09 DIAGNOSIS — Z471 Aftercare following joint replacement surgery: Secondary | ICD-10-CM | POA: Diagnosis not present

## 2021-11-09 DIAGNOSIS — I10 Essential (primary) hypertension: Secondary | ICD-10-CM | POA: Diagnosis not present

## 2021-11-09 DIAGNOSIS — M160 Bilateral primary osteoarthritis of hip: Secondary | ICD-10-CM | POA: Diagnosis not present

## 2021-11-09 DIAGNOSIS — M19011 Primary osteoarthritis, right shoulder: Secondary | ICD-10-CM | POA: Diagnosis not present

## 2021-11-09 DIAGNOSIS — M170 Bilateral primary osteoarthritis of knee: Secondary | ICD-10-CM | POA: Diagnosis not present

## 2021-11-09 DIAGNOSIS — M47816 Spondylosis without myelopathy or radiculopathy, lumbar region: Secondary | ICD-10-CM | POA: Diagnosis not present

## 2021-11-09 NOTE — Telephone Encounter (Signed)
Received HH order from [facility] home health regarding BP medication; lisinopril.  ? ?Placed on PCP desk to review and sign, if appropriate. ? ?

## 2021-11-10 DIAGNOSIS — M160 Bilateral primary osteoarthritis of hip: Secondary | ICD-10-CM | POA: Diagnosis not present

## 2021-11-10 DIAGNOSIS — M47816 Spondylosis without myelopathy or radiculopathy, lumbar region: Secondary | ICD-10-CM | POA: Diagnosis not present

## 2021-11-10 DIAGNOSIS — M19011 Primary osteoarthritis, right shoulder: Secondary | ICD-10-CM | POA: Diagnosis not present

## 2021-11-10 DIAGNOSIS — I10 Essential (primary) hypertension: Secondary | ICD-10-CM | POA: Diagnosis not present

## 2021-11-10 DIAGNOSIS — Z471 Aftercare following joint replacement surgery: Secondary | ICD-10-CM | POA: Diagnosis not present

## 2021-11-10 DIAGNOSIS — M170 Bilateral primary osteoarthritis of knee: Secondary | ICD-10-CM | POA: Diagnosis not present

## 2021-11-10 NOTE — Telephone Encounter (Signed)
Signed and put in box to go up front. ?Signed:  Crissie Sickles, MD           11/10/2021 ? ?

## 2021-11-11 ENCOUNTER — Telehealth: Payer: Self-pay

## 2021-11-11 NOTE — Telephone Encounter (Signed)
Spoke with pt's wife, Gregory Blevins regarding recommendations ?

## 2021-11-11 NOTE — Telephone Encounter (Signed)
These blood pressures and heart rates are all good. ?Continue half of the lisinopril tab daily. ?

## 2021-11-11 NOTE — Telephone Encounter (Signed)
Based on last OV 4/28; #2 hypotension, asymptomatic. ?No sign of hypovolemia at this time. ?Cut lisinopril dose down to one half of the 30 mg tab daily. ?Check blood pressure daily and call with report of these in 7 days. ? ?Pt has brought BP readings for review. ?

## 2021-11-12 ENCOUNTER — Other Ambulatory Visit: Payer: Self-pay | Admitting: Family Medicine

## 2021-11-14 DIAGNOSIS — Z471 Aftercare following joint replacement surgery: Secondary | ICD-10-CM | POA: Diagnosis not present

## 2021-11-14 DIAGNOSIS — I10 Essential (primary) hypertension: Secondary | ICD-10-CM | POA: Diagnosis not present

## 2021-11-14 DIAGNOSIS — M170 Bilateral primary osteoarthritis of knee: Secondary | ICD-10-CM | POA: Diagnosis not present

## 2021-11-14 DIAGNOSIS — M47816 Spondylosis without myelopathy or radiculopathy, lumbar region: Secondary | ICD-10-CM | POA: Diagnosis not present

## 2021-11-14 DIAGNOSIS — M160 Bilateral primary osteoarthritis of hip: Secondary | ICD-10-CM | POA: Diagnosis not present

## 2021-11-14 DIAGNOSIS — M19011 Primary osteoarthritis, right shoulder: Secondary | ICD-10-CM | POA: Diagnosis not present

## 2021-11-15 DIAGNOSIS — M170 Bilateral primary osteoarthritis of knee: Secondary | ICD-10-CM | POA: Diagnosis not present

## 2021-11-15 DIAGNOSIS — M160 Bilateral primary osteoarthritis of hip: Secondary | ICD-10-CM | POA: Diagnosis not present

## 2021-11-15 DIAGNOSIS — Z471 Aftercare following joint replacement surgery: Secondary | ICD-10-CM | POA: Diagnosis not present

## 2021-11-15 DIAGNOSIS — I10 Essential (primary) hypertension: Secondary | ICD-10-CM | POA: Diagnosis not present

## 2021-11-15 DIAGNOSIS — M19011 Primary osteoarthritis, right shoulder: Secondary | ICD-10-CM | POA: Diagnosis not present

## 2021-11-15 DIAGNOSIS — M47816 Spondylosis without myelopathy or radiculopathy, lumbar region: Secondary | ICD-10-CM | POA: Diagnosis not present

## 2021-11-16 ENCOUNTER — Telehealth: Payer: Self-pay | Admitting: Orthopedic Surgery

## 2021-11-16 DIAGNOSIS — I10 Essential (primary) hypertension: Secondary | ICD-10-CM | POA: Diagnosis not present

## 2021-11-16 DIAGNOSIS — M19011 Primary osteoarthritis, right shoulder: Secondary | ICD-10-CM | POA: Diagnosis not present

## 2021-11-16 DIAGNOSIS — M160 Bilateral primary osteoarthritis of hip: Secondary | ICD-10-CM | POA: Diagnosis not present

## 2021-11-16 DIAGNOSIS — M47816 Spondylosis without myelopathy or radiculopathy, lumbar region: Secondary | ICD-10-CM | POA: Diagnosis not present

## 2021-11-16 DIAGNOSIS — Z471 Aftercare following joint replacement surgery: Secondary | ICD-10-CM | POA: Diagnosis not present

## 2021-11-16 DIAGNOSIS — M170 Bilateral primary osteoarthritis of knee: Secondary | ICD-10-CM | POA: Diagnosis not present

## 2021-11-16 NOTE — Telephone Encounter (Signed)
Shawn (PT) from [facility] called for updates. Raquel Sarna states he is discharging pt from physical therapy. Pt is getting around very well. Shawn states pt will continue OT for shoulder. Shawn's secure phone number is [phone] if we have any questions ?

## 2021-11-17 ENCOUNTER — Ambulatory Visit (INDEPENDENT_AMBULATORY_CARE_PROVIDER_SITE_OTHER): Payer: Medicare Other | Admitting: Orthopedic Surgery

## 2021-11-17 ENCOUNTER — Ambulatory Visit (INDEPENDENT_AMBULATORY_CARE_PROVIDER_SITE_OTHER): Payer: Medicare Other

## 2021-11-17 DIAGNOSIS — M19012 Primary osteoarthritis, left shoulder: Secondary | ICD-10-CM | POA: Diagnosis not present

## 2021-11-17 DIAGNOSIS — M19011 Primary osteoarthritis, right shoulder: Secondary | ICD-10-CM | POA: Diagnosis not present

## 2021-11-17 DIAGNOSIS — Z471 Aftercare following joint replacement surgery: Secondary | ICD-10-CM | POA: Diagnosis not present

## 2021-11-17 DIAGNOSIS — M170 Bilateral primary osteoarthritis of knee: Secondary | ICD-10-CM | POA: Diagnosis not present

## 2021-11-17 DIAGNOSIS — I10 Essential (primary) hypertension: Secondary | ICD-10-CM | POA: Diagnosis not present

## 2021-11-17 DIAGNOSIS — M47816 Spondylosis without myelopathy or radiculopathy, lumbar region: Secondary | ICD-10-CM | POA: Diagnosis not present

## 2021-11-17 DIAGNOSIS — M160 Bilateral primary osteoarthritis of hip: Secondary | ICD-10-CM | POA: Diagnosis not present

## 2021-11-17 NOTE — Telephone Encounter (Signed)
Patient seen earlier today in clinic for post op check ?

## 2021-11-17 NOTE — Telephone Encounter (Signed)
Going to out pt pt

## 2021-11-18 ENCOUNTER — Encounter: Payer: Self-pay | Admitting: Orthopedic Surgery

## 2021-11-18 NOTE — Progress Notes (Signed)
? ?Post-Op Visit Note ?  ?Patient: Gregory Blevins           ?Date of Birth: 23-Mar-1941           ?MRN: [id number] ?Visit Date: 11/17/2021 ?PCP: Tammi Sou, MD ? ? ?Assessment & Plan: ? ?Chief Complaint:  ?Chief Complaint  ?Patient presents with  ? Left Shoulder - Routine Post Op  ?   10/17/21 (4w 3d) Left Reverse Shoulder Arthroplasty  ?  ? ?Visit Diagnoses:  ?1. Arthritis of left shoulder region   ? ? ?Plan: Gregory Blevins is an 81 year old patient who is now about a month out left shoulder reverse shoulder replacement.  Patient has been doing better from a medical perspective.  Still hard for him to sleep on the left-hand side.  Doing home health physical therapy and CPM brace up to 90 degrees. ? ?On examination deltoid is functional.  Does have restricted external rotation to about 10 degrees.  45 of abduction and 70 degrees of forward flexion actively is present.  Radiographs of the reverse replacement show good alignment and position.  Plan at this time is physical therapy to start at [facility] next week.  Follow-up in 4 weeks for clinical recheck on range of motion. ? ?Follow-Up Instructions: No follow-ups on file.  ? ?Orders:  ?Orders Placed This Encounter  ?Procedures  ? XR Shoulder Left  ? ?No orders of the defined types were placed in this encounter. ? ? ?Imaging: ?No results found. ? ?PMFS History: ?Patient Active Problem List  ? Diagnosis Date Noted  ? Arthritis of left shoulder region   ? AKI (acute kidney injury) ([place])   ? Duodenal ulcer   ? GI bleed 10/26/2021  ? 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 Prostate cancer screening 10/21/2015  ? Preventative health care 04/10/2014  ? 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 Facet arthropathy, lumbar 04/22/2013  ? Pelvic pain in male 03/21/2013  ? Health maintenance examination 09/27/2012  ? HTN (hypertension), benign 09/27/2012  ? Basal cell carcinoma of skin 07/12/2011  ? Colon polyp  07/12/2011  ? High risk medication use 07/12/2011  ? Elevation of level of transaminase or lactic acid dehydrogenase (LDH) 08/31/2010  ? Onychomycosis due to dermatophyte 08/31/2010  ? Osteoarthrosis involving lower leg 08/31/2010  ? Other and unspecified hyperlipidemia 08/31/2010  ? 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 Myalgia and myositis, unspecified 10/19/2009  ? 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 Morbid obesity ([place]) 06/17/2009  ? 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 ?Past Medical History:  ?Diagnosis Date  ? Chronic left shoulder pain   ? 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 Diverticulosis 2013  ? noted on colonoscopy  ? Fatty liver 05/2012  ? u/s done for mild/persistent elevation of LFTs  ? Hearing impairment   ? 11/2015 Audiology eval= not yet ready for hearing aids ([facility])  ? History of adenomatous polyp of colon   ? On multiple colonoscopies: most recent colonoscopy 05/31/18-->adenoma x 2, repeat 3-5 ys is optional.  ? History of basal cell carcinoma of skin   ? HTN (hypertension)   ? Hyperlipidemia   ? Lumbar spondylosis 04/2013  ? 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  ? Microhematuria 09/2017  ? On dipstick only.  Urine micro 09/2017 and 04/2018-->no RBCs.  ? OAB (overactive bladder)   ? ditropan xl 10 ineffective--pt d/c'd this 09/2018  ? Obesity   ? Osteoarthritis   ?  Hips (L>R on x-ray 04/2013), shoulders, and knees (hx of adhesive capsulitis of shoulder)  ? Prediabetes   ? A1c 6.[facility]% 08/2019 [facility]  ? Rectal leakage   ? UGI bleed 10/26/2021  ? duodenal ulcer-->transfused 2 U pRBCs  ?  ?Family History  ?Problem Relation Age of Onset  ? Hypertension Father   ? Alcoholism Father   ? Stomach cancer Father   ?     primary with mets to colon  ? Colon cancer Father   ? Dementia Mother   ? Asthma Brother   ? Diabetes Sister   ? Colon polyps Neg Hx   ? Esophageal cancer Neg Hx   ? Rectal cancer Neg Hx   ?  ?Past Surgical History:  ?Procedure Laterality Date  ? BIOPSY   10/27/2021  ? Procedure: BIOPSY;  Surgeon: Thornton Park, MD;  Location: [facility] ENDOSCOPY;  Service: Gastroenterology;;  ? COLONOSCOPY    ? 2008 w/polyps,  09-22-2011 w/polyps.  01/2015 tubular adenoma x 1.  05/31/18 colonoscopy w/ two tubular adenomas.  ? COLONOSCOPY W/ POLYPECTOMY  2008; 09/22/11;01/2015  ? Tubular adenoma--recall 3 yrs per Dr. Louanne Belton at [place].  Then switched to Dr. Hilarie Fredrickson with [place].  ? ESOPHAGOGASTRODUODENOSCOPY  10/27/2021  ? duodenal ulcer, h pylori NEG  ? ESOPHAGOGASTRODUODENOSCOPY (EGD) WITH PROPOFOL N/A 10/27/2021  ? Procedure: ESOPHAGOGASTRODUODENOSCOPY (EGD) WITH PROPOFOL;  Surgeon: Thornton Park, MD;  Location: [facility];  Service: Gastroenterology;  Laterality: N/A;  ? POLYPECTOMY    ? REVERSE SHOULDER ARTHROPLASTY Left 10/17/2021  ? Procedure: LEFT REVERSE SHOULDER ARTHROPLASTY;  Surgeon: Meredith Pel, MD;  Location: [facility];  Service: Orthopedics;  Laterality: Left;  ? TONSILLECTOMY    ? TRANSTHORACIC ECHOCARDIOGRAM  10/27/2021  ? 10/2021: grd I DD, o/w normal  ? UMBILICAL HERNIA REPAIR  2011  ? WISDOM TOOTH EXTRACTION Bilateral   ? ?Social History  ? ?Occupational History  ? Not on file  ?Tobacco Use  ? Smoking status: Never  ? Smokeless tobacco: Never  ?Vaping Use  ? Vaping Use: Never used  ?Substance and Sexual Activity  ? Alcohol use: Yes  ?  Alcohol/week: 1.0 standard drink  ?  Types: 1 Cans of beer per week  ?  Comment: occassional  ? Drug use: No  ? Sexual activity: Yes  ? ? ? ?

## 2021-11-21 DIAGNOSIS — H9190 Unspecified hearing loss, unspecified ear: Secondary | ICD-10-CM | POA: Diagnosis not present

## 2021-11-21 DIAGNOSIS — Z86018 Personal history of other benign neoplasm: Secondary | ICD-10-CM | POA: Diagnosis not present

## 2021-11-21 DIAGNOSIS — M170 Bilateral primary osteoarthritis of knee: Secondary | ICD-10-CM | POA: Diagnosis not present

## 2021-11-21 DIAGNOSIS — K760 Fatty (change of) liver, not elsewhere classified: Secondary | ICD-10-CM | POA: Diagnosis not present

## 2021-11-21 DIAGNOSIS — M160 Bilateral primary osteoarthritis of hip: Secondary | ICD-10-CM | POA: Diagnosis not present

## 2021-11-21 DIAGNOSIS — Z7982 Long term (current) use of aspirin: Secondary | ICD-10-CM | POA: Diagnosis not present

## 2021-11-21 DIAGNOSIS — R7303 Prediabetes: Secondary | ICD-10-CM | POA: Diagnosis not present

## 2021-11-21 DIAGNOSIS — I1310 Hypertensive heart and chronic kidney disease without heart failure, with stage 1 through stage 4 chronic kidney disease, or unspecified chronic kidney disease: Secondary | ICD-10-CM | POA: Diagnosis not present

## 2021-11-21 DIAGNOSIS — E669 Obesity, unspecified: Secondary | ICD-10-CM | POA: Diagnosis not present

## 2021-11-21 DIAGNOSIS — Z8719 Personal history of other diseases of the digestive system: Secondary | ICD-10-CM | POA: Diagnosis not present

## 2021-11-21 DIAGNOSIS — D631 Anemia in chronic kidney disease: Secondary | ICD-10-CM | POA: Diagnosis not present

## 2021-11-21 DIAGNOSIS — N1831 Chronic kidney disease, stage 3a: Secondary | ICD-10-CM | POA: Diagnosis not present

## 2021-11-21 DIAGNOSIS — N3281 Overactive bladder: Secondary | ICD-10-CM | POA: Diagnosis not present

## 2021-11-21 DIAGNOSIS — K269 Duodenal ulcer, unspecified as acute or chronic, without hemorrhage or perforation: Secondary | ICD-10-CM | POA: Diagnosis not present

## 2021-11-21 DIAGNOSIS — Z9181 History of falling: Secondary | ICD-10-CM | POA: Diagnosis not present

## 2021-11-21 DIAGNOSIS — M47816 Spondylosis without myelopathy or radiculopathy, lumbar region: Secondary | ICD-10-CM | POA: Diagnosis not present

## 2021-11-21 DIAGNOSIS — Z6834 Body mass index (BMI) 34.0-34.9, adult: Secondary | ICD-10-CM | POA: Diagnosis not present

## 2021-11-21 DIAGNOSIS — Z96612 Presence of left artificial shoulder joint: Secondary | ICD-10-CM | POA: Diagnosis not present

## 2021-11-21 DIAGNOSIS — Z471 Aftercare following joint replacement surgery: Secondary | ICD-10-CM | POA: Diagnosis not present

## 2021-11-21 DIAGNOSIS — D62 Acute posthemorrhagic anemia: Secondary | ICD-10-CM | POA: Diagnosis not present

## 2021-11-21 DIAGNOSIS — M19011 Primary osteoarthritis, right shoulder: Secondary | ICD-10-CM | POA: Diagnosis not present

## 2021-11-21 DIAGNOSIS — E785 Hyperlipidemia, unspecified: Secondary | ICD-10-CM | POA: Diagnosis not present

## 2021-11-21 DIAGNOSIS — K5730 Diverticulosis of large intestine without perforation or abscess without bleeding: Secondary | ICD-10-CM | POA: Diagnosis not present

## 2021-11-21 DIAGNOSIS — Z85828 Personal history of other malignant neoplasm of skin: Secondary | ICD-10-CM | POA: Diagnosis not present

## 2021-11-22 DIAGNOSIS — M19011 Primary osteoarthritis, right shoulder: Secondary | ICD-10-CM | POA: Diagnosis not present

## 2021-11-22 DIAGNOSIS — Z96612 Presence of left artificial shoulder joint: Secondary | ICD-10-CM | POA: Diagnosis not present

## 2021-11-22 DIAGNOSIS — D62 Acute posthemorrhagic anemia: Secondary | ICD-10-CM | POA: Diagnosis not present

## 2021-11-22 DIAGNOSIS — Z471 Aftercare following joint replacement surgery: Secondary | ICD-10-CM | POA: Diagnosis not present

## 2021-11-22 DIAGNOSIS — K269 Duodenal ulcer, unspecified as acute or chronic, without hemorrhage or perforation: Secondary | ICD-10-CM | POA: Diagnosis not present

## 2021-11-22 DIAGNOSIS — M160 Bilateral primary osteoarthritis of hip: Secondary | ICD-10-CM | POA: Diagnosis not present

## 2021-11-23 DIAGNOSIS — K269 Duodenal ulcer, unspecified as acute or chronic, without hemorrhage or perforation: Secondary | ICD-10-CM | POA: Diagnosis not present

## 2021-11-23 DIAGNOSIS — Z96612 Presence of left artificial shoulder joint: Secondary | ICD-10-CM | POA: Diagnosis not present

## 2021-11-23 DIAGNOSIS — D62 Acute posthemorrhagic anemia: Secondary | ICD-10-CM | POA: Diagnosis not present

## 2021-11-23 DIAGNOSIS — Z471 Aftercare following joint replacement surgery: Secondary | ICD-10-CM | POA: Diagnosis not present

## 2021-11-23 DIAGNOSIS — M160 Bilateral primary osteoarthritis of hip: Secondary | ICD-10-CM | POA: Diagnosis not present

## 2021-11-23 DIAGNOSIS — M19011 Primary osteoarthritis, right shoulder: Secondary | ICD-10-CM | POA: Diagnosis not present

## 2021-11-24 ENCOUNTER — Encounter: Payer: Self-pay | Admitting: *Deleted

## 2021-11-25 DIAGNOSIS — M19012 Primary osteoarthritis, left shoulder: Secondary | ICD-10-CM | POA: Diagnosis not present

## 2021-11-28 DIAGNOSIS — M19012 Primary osteoarthritis, left shoulder: Secondary | ICD-10-CM | POA: Diagnosis not present

## 2021-11-30 DIAGNOSIS — M19012 Primary osteoarthritis, left shoulder: Secondary | ICD-10-CM | POA: Diagnosis not present

## 2021-12-01 ENCOUNTER — Ambulatory Visit (INDEPENDENT_AMBULATORY_CARE_PROVIDER_SITE_OTHER): Payer: Medicare Other | Admitting: Internal Medicine

## 2021-12-01 ENCOUNTER — Encounter: Payer: Self-pay | Admitting: Internal Medicine

## 2021-12-01 VITALS — BP 128/60 | HR 72 | Ht 67.0 in | Wt 220.0 lb

## 2021-12-01 DIAGNOSIS — K264 Chronic or unspecified duodenal ulcer with hemorrhage: Secondary | ICD-10-CM

## 2021-12-01 DIAGNOSIS — K2100 Gastro-esophageal reflux disease with esophagitis, without bleeding: Secondary | ICD-10-CM | POA: Diagnosis not present

## 2021-12-01 MED ORDER — PANTOPRAZOLE SODIUM 40 MG PO TBEC: 40.0000 mg | DELAYED_RELEASE_TABLET | Freq: Two times a day (BID) | ORAL | 3 refills | Status: DC

## 2021-12-01 NOTE — Progress Notes (Signed)
Subjective:    Patient ID: Gregory Blevins, male    DOB: Mar 05, 1941, 81 y.o.   MRN: [id number]  HPI Gregory Blevins is an 81 year old male recently hospitalized with bleeding duodenal ulcer also found to have severe esophagitis, prior history of colon polyps, diverticulosis, hypertension, hyperlipidemia, recent left shoulder surgery who is seen for hospital follow-up.  He is here today with his wife.  Dr. Tarri Glenn performed an upper endoscopy on 10/27/2021 for melena and suspected upper GI bleeding.  LA grade D esophagitis was found in the lower esophagus.  There was a large nonbleeding deeply cratered duodenal ulcer in the bulb.  He required blood transfusion during hospitalization and has been taking pantoprazole twice daily since that time.  Today he reports he is feeling well.  He is having no issues with abdominal pain, nausea or vomiting.  Good appetite.  No heartburn or dysphagia symptom.  Bowel movements have been more regular without blood in stool or melena.  He has strictly avoided NSAIDs.  He continues physical therapy on his shoulder which continues to get better.   Review of Systems As per HPI, otherwise negative  Current Medications, Allergies, Past Medical History, Past Surgical History, Family History and Social History were reviewed in [employer] record.    Objective:   Physical Exam BP 128/60   Pulse 72   Ht 5' 7"  (1.702 m)   Wt 220 lb (99.8 kg)   BMI 34.46 kg/m  Gen: awake, alert, NAD HEENT: anicteric CV: RRR, no mrg Pulm: CTA b/l Abd: soft, NT/ND, +BS throughout Ext: no c/c/e Neuro: nonfocal     Latest Ref Rng & Units 11/04/2021   11:54 AM 10/30/2021    2:23 AM 10/29/2021   12:07 PM  CBC  WBC 4.0 - 10.5 K/uL 10.0   15.6   14.7    Hemoglobin 13.0 - 17.0 g/dL 9.5   9.0   7.5    Hematocrit 39.0 - 52.0 % 28.2   28.1   23.4    Platelets 150.0 - 400.0 K/uL 324.0   192   186     CMP     Component Value Date/Time   NA 138 11/04/2021  1154   K 4.4 11/04/2021 1154   CL 103 11/04/2021 1154   CO2 29 11/04/2021 1154   GLUCOSE 84 11/04/2021 1154   BUN 20 11/04/2021 1154   CREATININE 1.03 11/04/2021 1154   CALCIUM 8.4 11/04/2021 1154   PROT 5.1 (L) 10/30/2021 0223   ALBUMIN 2.8 (L) 10/30/2021 0223   AST 21 10/30/2021 0223   ALT 19 10/30/2021 0223   ALKPHOS 49 10/30/2021 0223   BILITOT 0.7 10/30/2021 0223   GFRNONAA >60 10/30/2021 0223   GFRAA >60 07/09/2017 0348       Assessment & Plan:  81 year old male recently hospitalized with bleeding duodenal ulcer also found to have severe esophagitis, prior history of colon polyps, diverticulosis, hypertension, hyperlipidemia, recent left shoulder surgery who is seen for hospital follow-up.   GERD with LA grade D esophagitis/duodenal ulcer with bleeding --he has recovered from hospitalization and fortunately is having no GI complaint.  No further melena.  Good appetite and no evidence for gastric outlet obstructive symptoms.  I recommended the following: -- Continue pantoprazole 40 mg twice daily AC --Proceed with endoscopy as scheduled on 01/02/2022 2 ensure healing of esophagitis and duodenal ulcer --H. pylori negative by biopsy; consider repeat biopsy at repeat EGD to ensure no H. pylori as he was really  not using NSAIDs at the time of ulcer diagnosis. --Continue to avoid NSAIDs and notify my office of any recurrent melena prior to upper endoscopy  2.  History of colon polyps --last colonoscopy 2019; surveillance discontinued based on age after that exam  30 minutes total spent today including patient facing time, coordination of care, reviewing medical history/procedures/pertinent radiology studies, and documentation of the encounter.

## 2021-12-01 NOTE — Patient Instructions (Addendum)
We have sent the following medications to your pharmacy for you to pick up at your convenience: Pantoprazole 40 mg twice daily  You have been scheduled for an endoscopy. Please follow written instructions given to you at your visit today. If you use inhalers (even only as needed), please bring them with you on the day of your procedure.  If you are age 80 or older, your body mass index should be between 23-30. Your Body mass index is 34.46 kg/m. If this is out of the aforementioned range listed, please consider follow up with your Primary Care Provider.  If you are age 35 or younger, your body mass index should be between 19-25. Your Body mass index is 34.46 kg/m. If this is out of the aformentioned range listed, please consider follow up with your Primary Care Provider.   ________________________________________________________  The [facility] providers would like to encourage you to use [facility] to communicate with providers for non-urgent requests or questions.  Due to long hold times on the telephone, sending your provider a message by [facility] may be a faster and more efficient way to get a response.  Please allow 48 business hours for a response.  Please remember that this is for non-urgent requests.  _______________________________________________________  Due to recent changes in healthcare laws, you may see the results of your imaging and laboratory studies on MyChart before your provider has had a chance to review them.  We understand that in some cases there may be results that are confusing or concerning to you. Not all laboratory results come back in the same time frame and the provider may be waiting for multiple results in order to interpret others.  Please give [place] 48 hours in order for your provider to thoroughly review all the results before contacting the office for clarification of your results.

## 2021-12-03 DIAGNOSIS — M19012 Primary osteoarthritis, left shoulder: Secondary | ICD-10-CM | POA: Diagnosis not present

## 2021-12-05 ENCOUNTER — Other Ambulatory Visit: Payer: Self-pay | Admitting: Family Medicine

## 2021-12-07 DIAGNOSIS — M19012 Primary osteoarthritis, left shoulder: Secondary | ICD-10-CM | POA: Diagnosis not present

## 2021-12-09 ENCOUNTER — Other Ambulatory Visit: Payer: Self-pay | Admitting: Family Medicine

## 2021-12-09 DIAGNOSIS — M19012 Primary osteoarthritis, left shoulder: Secondary | ICD-10-CM | POA: Diagnosis not present

## 2021-12-10 DIAGNOSIS — M19012 Primary osteoarthritis, left shoulder: Secondary | ICD-10-CM | POA: Diagnosis not present

## 2021-12-12 DIAGNOSIS — M19012 Primary osteoarthritis, left shoulder: Secondary | ICD-10-CM | POA: Diagnosis not present

## 2021-12-14 DIAGNOSIS — M19012 Primary osteoarthritis, left shoulder: Secondary | ICD-10-CM | POA: Diagnosis not present

## 2021-12-15 ENCOUNTER — Encounter: Payer: Self-pay | Admitting: Orthopedic Surgery

## 2021-12-15 ENCOUNTER — Ambulatory Visit (INDEPENDENT_AMBULATORY_CARE_PROVIDER_SITE_OTHER): Payer: Medicare Other | Admitting: Surgical

## 2021-12-15 DIAGNOSIS — Z96612 Presence of left artificial shoulder joint: Secondary | ICD-10-CM

## 2021-12-15 MED ORDER — AMOXICILLIN 500 MG PO TABS: 2000.0000 mg | ORAL_TABLET | Freq: Once | ORAL | 0 refills | Status: AC

## 2021-12-15 NOTE — Progress Notes (Signed)
Post-Op Visit Note   Patient: Gregory Blevins           Date of Birth: 22-Apr-1941           MRN: [id number] Visit Date: 12/15/2021 PCP: Tammi Sou, MD   Assessment & Plan:  Chief Complaint:  Chief Complaint  Patient presents with   Left Shoulder - Routine Post Op    10/17/21 (8w 3d) Left Reverse Shoulder Arthroplasty      Visit Diagnoses:  1. S/P reverse total shoulder arthroplasty, left     Plan: Patient is an 81 year old male who presents s/p left reverse shoulder arthroplasty on 10/17/2021.  He is doing very well and is very satisfied with how his shoulder is feeling currently.  He has been going to physical therapy for the last several weeks and he feels this has been very helpful.  Denies any chest pain, shortness of breath, abdominal pain, fevers, chills, drainage from the incision.  Feels that his left shoulder is currently a lot better compared with how his shoulder felt prior to surgery.  He does note some continued numbness and tingling in the left small finger but states that the left ring finger numbness/tingling he was having is improving.  On exam, patient has 0 degrees external rotation, 70 degrees abduction, 100 degrees forward flexion.  Axillary nerve intact with deltoid firing.  Incision is well-healed from prior shoulder replacement.  2+ radial pulse of the operative extremity.  He does have positive Froment's sign and weakness of finger abduction, particularly of the small finger compared with the contralateral extremity.  Plan is continue with physical therapy.  Counseled patient on antibiotic dental prophylaxis and filled out form for his dentist.  Plan to follow-up in 6 weeks for clinical recheck.  Offered nerve conduction study to evaluate for ulnar neuropathy but patient would like to hold off on any further intervention at this time and to see if this will get better like the ring finger symptoms did over the next 6 weeks.  Follow-Up Instructions: No  follow-ups on file.   Orders:  No orders of the defined types were placed in this encounter.  Meds ordered this encounter  Medications   amoxicillin (AMOXIL) 500 MG tablet    Sig: Take 4 tablets (2,000 mg total) by mouth once for 1 dose. Take 30-60 mins prior to dental appointment.    Dispense:  8 tablet    Refill:  0    Imaging: No results found.  PMFS History: Patient Active Problem List   Diagnosis Date Noted   Arthritis of left shoulder region    AKI (acute kidney injury) ([place])    Duodenal ulcer    GI bleed 10/26/2021   S/P reverse total shoulder arthroplasty, left 10/17/2021   Primary osteoarthritis, left shoulder 09/19/2021   Prostate cancer screening 10/21/2015   Preventative health care 04/10/2014   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/22/2013   Facet arthropathy, lumbar 04/22/2013   Pelvic pain in male 03/21/2013   Health maintenance examination 09/27/2012   HTN (hypertension), benign 09/27/2012   Basal cell carcinoma of skin 07/12/2011   Colon polyp 07/12/2011   High risk medication use 07/12/2011   Elevation of level of transaminase or lactic acid dehydrogenase (LDH) 08/31/2010   Onychomycosis due to dermatophyte 08/31/2010   Osteoarthrosis involving lower leg 08/31/2010   Other and unspecified hyperlipidemia 08/31/2010   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   Myalgia and myositis, unspecified 10/19/2009   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   Morbid  obesity ([place]) 06/17/2009   Night blindness 06/17/2009   Past Medical History:  Diagnosis Date   Chronic left shoulder pain    Severe GH arthr->GH steroid injection 10/2019   CKD (chronic kidney disease)    Diverticulosis 2013   noted on colonoscopy   Duodenal ulcer    Esophagitis    Fatty liver 05/2012   u/s done for mild/persistent elevation of LFTs   GI bleed    Hearing impairment    11/2015 Audiology eval= not yet ready for hearing aids ([facility])   History of adenomatous polyp of colon    On  multiple colonoscopies: most recent colonoscopy 05/31/18-->adenoma x 2, repeat 3-5 ys is optional.   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   Microhematuria 09/2017   On dipstick only.  Urine micro 09/2017 and 04/2018-->no RBCs.   OAB (overactive bladder)    ditropan xl 10 ineffective--pt d/c'd this 09/2018   Obesity    Osteoarthritis    Hips (L>R on x-ray 04/2013), shoulders, and knees (hx of adhesive capsulitis of shoulder)   Prediabetes    A1c 6.1% 08/2019 [facility]   Rectal leakage    UGI bleed 10/26/2021   duodenal ulcer-->transfused 2 U pRBCs    Family History  Problem Relation Age of Onset   Hypertension Father    Alcoholism Father    Stomach cancer Father        primary with mets to colon   Colon cancer Father    Dementia Mother    Asthma Brother    Diabetes Sister    Colon polyps Neg Hx    Esophageal cancer Neg Hx    Rectal cancer Neg Hx     Past Surgical History:  Procedure Laterality Date   BIOPSY  10/27/2021   Procedure: BIOPSY;  Surgeon: Thornton Park, MD;  Location: [facility] ENDOSCOPY;  Service: Gastroenterology;;   COLONOSCOPY     2008 w/polyps,  09-22-2011 w/polyps.  01/2015 tubular adenoma x 1.  05/31/18 colonoscopy w/ two tubular adenomas.   COLONOSCOPY W/ POLYPECTOMY  2008; 09/22/11;01/2015   Tubular adenoma--recall 3 yrs per Dr. Louanne Belton at [place].  Then switched to Dr. Hilarie Fredrickson with .   ESOPHAGOGASTRODUODENOSCOPY  10/27/2021   duodenal ulcer, h pylori NEG   ESOPHAGOGASTRODUODENOSCOPY (EGD) WITH PROPOFOL N/A 10/27/2021   Procedure: ESOPHAGOGASTRODUODENOSCOPY (EGD) WITH PROPOFOL;  Surgeon: Thornton Park, MD;  Location: [place];  Service: Gastroenterology;  Laterality: N/A;   POLYPECTOMY     REVERSE SHOULDER ARTHROPLASTY Left 10/17/2021   Procedure: LEFT REVERSE SHOULDER ARTHROPLASTY;  Surgeon:  Meredith Pel, MD;  Location: [place];  Service: Orthopedics;  Laterality: Left;   TONSILLECTOMY     TRANSTHORACIC ECHOCARDIOGRAM  10/27/2021   10/2021: grd I DD, o/w normal   UMBILICAL HERNIA REPAIR  2011   WISDOM TOOTH EXTRACTION Bilateral    Social History   Occupational History   Not on file  Tobacco Use   Smoking status: Never   Smokeless tobacco: Never  Vaping Use   Vaping Use: Never used  Substance and Sexual Activity   Alcohol use: Yes    Alcohol/week: 1.0 standard drink of alcohol    Types: 1 Cans of beer per week    Comment: occassional   Drug use: No   Sexual activity: Yes

## 2021-12-16 DIAGNOSIS — M19012 Primary osteoarthritis, left shoulder: Secondary | ICD-10-CM | POA: Diagnosis not present

## 2021-12-19 DIAGNOSIS — M19012 Primary osteoarthritis, left shoulder: Secondary | ICD-10-CM | POA: Diagnosis not present

## 2021-12-21 ENCOUNTER — Ambulatory Visit (INDEPENDENT_AMBULATORY_CARE_PROVIDER_SITE_OTHER): Payer: Medicare Other | Admitting: Podiatry

## 2021-12-21 ENCOUNTER — Encounter: Payer: Self-pay | Admitting: Podiatry

## 2021-12-21 DIAGNOSIS — M79674 Pain in right toe(s): Secondary | ICD-10-CM | POA: Diagnosis not present

## 2021-12-21 DIAGNOSIS — B351 Tinea unguium: Secondary | ICD-10-CM

## 2021-12-21 DIAGNOSIS — M79675 Pain in left toe(s): Secondary | ICD-10-CM | POA: Diagnosis not present

## 2021-12-21 DIAGNOSIS — I739 Peripheral vascular disease, unspecified: Secondary | ICD-10-CM

## 2021-12-22 DIAGNOSIS — M19012 Primary osteoarthritis, left shoulder: Secondary | ICD-10-CM | POA: Diagnosis not present

## 2021-12-23 DIAGNOSIS — M19012 Primary osteoarthritis, left shoulder: Secondary | ICD-10-CM | POA: Diagnosis not present

## 2021-12-25 ENCOUNTER — Other Ambulatory Visit: Payer: Self-pay | Admitting: Family Medicine

## 2021-12-26 NOTE — Progress Notes (Signed)
  Subjective:  Patient ID: Gregory Blevins, male    DOB: Jun 03, 1941,  MRN: [id number]  Gregory Blevins presents to clinic today for painful thick toenails that are difficult to trim. Pain interferes with ambulation. Aggravating factors include wearing enclosed shoe gear. Pain is relieved with periodic professional debridement.  Patient states he had left shoulder surgery and is recovering well.  New problem(s): None.   PCP is [name], [name], MD , and last visit was October 13, 2021.  No Known Allergies  Review of Systems: Negative except as noted in the HPI.  Objective: No changes noted in today's physical examination. General: Patient is a pleasant 81 y.o. Caucasian male in NAD. AAO x 3.   Neurovascular Examination: CFT <4 seconds b/l LE. Faintly palpable DP pulses b/l LE. Nonpalpable PT pulse(s) b/l LE. Pedal hair absent. No pain with calf compression b/l. Trace edema noted BLE.  Protective sensation intact 5/5 intact bilaterally with 10g monofilament b/l.  Dermatological:  Pedal integument with normal turgor, texture and tone BLE. Toenails 1-5 b/l elongated, discolored, dystrophic, thickened, crumbly with subungual debris and tenderness to dorsal palpation. No hyperkeratotic nor porokeratotic lesions present on today's visit.  Musculoskeletal:  Muscle strength 5/5 to all lower extremity muscle groups bilaterally. No pain, crepitus or joint limitation noted with ROM b/l lower extremities. Limited joint ROM to the 1st MPJ b/l.    Latest Ref Rng & Units 10/13/2021    9:38 AM 04/15/2021    9:40 AM  Hemoglobin A1C  Hemoglobin-A1c 4.6 - 6.5 % 5.9  6.0    Assessment/Plan: 1. Pain due to onychomycosis of toenails of both feet   2. PAD (peripheral artery disease) ([place])     -Patient was evaluated and treated. All patient's and/or POA's questions/concerns answered on today's visit. -Patient to continue soft, supportive shoe gear daily. -Mycotic toenails 1-5 bilaterally were debrided  in length and girth with sterile nail nippers and dremel without incident. -Patient/POA to call should there be question/concern in the interim.   Return in about 3 months (around 03/23/2022).  Marzetta Board, DPM

## 2021-12-27 DIAGNOSIS — M19012 Primary osteoarthritis, left shoulder: Secondary | ICD-10-CM | POA: Diagnosis not present

## 2021-12-29 DIAGNOSIS — M19012 Primary osteoarthritis, left shoulder: Secondary | ICD-10-CM | POA: Diagnosis not present

## 2021-12-30 DIAGNOSIS — M19012 Primary osteoarthritis, left shoulder: Secondary | ICD-10-CM | POA: Diagnosis not present

## 2022-01-02 ENCOUNTER — Ambulatory Visit (AMBULATORY_SURGERY_CENTER): Payer: Medicare Other | Admitting: Internal Medicine

## 2022-01-02 ENCOUNTER — Encounter: Payer: Self-pay | Admitting: Internal Medicine

## 2022-01-02 VITALS — BP 113/54 | HR 58 | Temp 98.4°F | Resp 12 | Ht 67.0 in | Wt 220.0 lb

## 2022-01-02 DIAGNOSIS — K2100 Gastro-esophageal reflux disease with esophagitis, without bleeding: Secondary | ICD-10-CM | POA: Diagnosis not present

## 2022-01-02 DIAGNOSIS — Z8719 Personal history of other diseases of the digestive system: Secondary | ICD-10-CM | POA: Diagnosis not present

## 2022-01-02 DIAGNOSIS — I10 Essential (primary) hypertension: Secondary | ICD-10-CM | POA: Diagnosis not present

## 2022-01-02 DIAGNOSIS — K269 Duodenal ulcer, unspecified as acute or chronic, without hemorrhage or perforation: Secondary | ICD-10-CM

## 2022-01-02 MED ORDER — SODIUM CHLORIDE 0.9 % IV SOLN: 500.0000 mL | Freq: Once | INTRAVENOUS | Status: DC

## 2022-01-03 ENCOUNTER — Telehealth: Payer: Self-pay | Admitting: *Deleted

## 2022-01-03 DIAGNOSIS — M19012 Primary osteoarthritis, left shoulder: Secondary | ICD-10-CM | POA: Diagnosis not present

## 2022-01-03 NOTE — Telephone Encounter (Signed)
  Follow up Call-     01/02/2022    9:22 AM  Call back number  Post procedure Call Back phone  # [phone]  Permission to leave phone message Yes     Patient questions:  Do you have a fever, pain , or abdominal swelling? No. Pain Score  0 *  Have you tolerated food without any problems? Yes.    Have you been able to return to your normal activities? Yes.    Do you have any questions about your discharge instructions: Diet   No. Medications  No. Follow up visit  No.  Do you have questions or concerns about your Care? No.  Actions: * If pain score is 4 or above: No action needed, pain <4.

## 2022-01-04 DIAGNOSIS — M19012 Primary osteoarthritis, left shoulder: Secondary | ICD-10-CM | POA: Diagnosis not present

## 2022-01-06 DIAGNOSIS — M19012 Primary osteoarthritis, left shoulder: Secondary | ICD-10-CM | POA: Diagnosis not present

## 2022-01-09 DIAGNOSIS — M19012 Primary osteoarthritis, left shoulder: Secondary | ICD-10-CM | POA: Diagnosis not present

## 2022-01-11 DIAGNOSIS — M19012 Primary osteoarthritis, left shoulder: Secondary | ICD-10-CM | POA: Diagnosis not present

## 2022-01-13 DIAGNOSIS — M19012 Primary osteoarthritis, left shoulder: Secondary | ICD-10-CM | POA: Diagnosis not present

## 2022-01-14 ENCOUNTER — Other Ambulatory Visit: Payer: Self-pay | Admitting: Family Medicine

## 2022-01-16 DIAGNOSIS — M19012 Primary osteoarthritis, left shoulder: Secondary | ICD-10-CM | POA: Diagnosis not present

## 2022-01-23 DIAGNOSIS — M19012 Primary osteoarthritis, left shoulder: Secondary | ICD-10-CM | POA: Diagnosis not present

## 2022-01-25 DIAGNOSIS — M19012 Primary osteoarthritis, left shoulder: Secondary | ICD-10-CM | POA: Diagnosis not present

## 2022-01-26 ENCOUNTER — Encounter: Payer: Self-pay | Admitting: Orthopedic Surgery

## 2022-01-26 ENCOUNTER — Ambulatory Visit (INDEPENDENT_AMBULATORY_CARE_PROVIDER_SITE_OTHER): Payer: Medicare Other | Admitting: Orthopedic Surgery

## 2022-01-26 DIAGNOSIS — Z96612 Presence of left artificial shoulder joint: Secondary | ICD-10-CM

## 2022-01-27 MED ORDER — GABAPENTIN 100 MG PO CAPS: 100.0000 mg | ORAL_CAPSULE | Freq: Three times a day (TID) | ORAL | 0 refills | Status: DC

## 2022-01-28 NOTE — Progress Notes (Signed)
Post-Op Visit Note   Patient: Gregory Blevins           Date of Birth: 1941-01-26           MRN: [id number] Visit Date: 01/26/2022 PCP: Tammi Sou, MD   Assessment & Plan:  Chief Complaint:  Chief Complaint  Patient presents with   Right Shoulder - Routine Post Op   Visit Diagnoses:  1. S/P reverse total shoulder arthroplasty, left     Plan: Gregory Blevins is an 81 year old patient who is now about 3 months out left reverse shoulder replacement.  He states that he is doing well with minimal to no pain symptoms.  Has improved with his range of motion.  On examination the incision is intact and the deltoid is functional.  He can achieve 90 degrees of forward flexion and abduction.  External rotation is about 20 to 25 degrees.  Motor or sensory function to the hand is intact.  Plan at this time is to continue to work on stretching and strengthening of that left arm.  He is happy with his surgical result.  Follow-up as needed.  Follow-Up Instructions: No follow-ups on file.   Orders:  No orders of the defined types were placed in this encounter.  Meds ordered this encounter  Medications   gabapentin (NEURONTIN) 100 MG capsule    Sig: Take 1 capsule (100 mg total) by mouth 3 (three) times daily.    Dispense:  90 capsule    Refill:  0    Imaging: No results found.  PMFS History: Patient Active Problem List   Diagnosis Date Noted   Arthritis of left shoulder region    AKI (acute kidney injury) ([place])    Duodenal ulcer    GI bleed 10/26/2021   S/P reverse total shoulder arthroplasty, left 10/17/2021   Primary osteoarthritis, left shoulder 09/19/2021   Prostate cancer screening 10/21/2015   Preventative health care 04/10/2014   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/22/2013   Facet arthropathy, lumbar 04/22/2013   Pelvic pain in male 03/21/2013   Health maintenance examination 09/27/2012   HTN (hypertension), benign 09/27/2012   Basal cell carcinoma of skin 07/12/2011   Colon polyp  07/12/2011   High risk medication use 07/12/2011   Elevation of level of transaminase or lactic acid dehydrogenase (LDH) 08/31/2010   Onychomycosis due to dermatophyte 08/31/2010   Osteoarthrosis involving lower leg 08/31/2010   Other and unspecified hyperlipidemia 08/31/2010   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   Myalgia and myositis, unspecified 10/19/2009   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   Morbid obesity ([place]) 06/17/2009   Night blindness 06/17/2009   Past Medical History:  Diagnosis Date   Chronic left shoulder pain    Severe GH arthr->GH steroid injection 10/2019   Diverticulosis 2013   noted on colonoscopy   Duodenal ulcer    Esophagitis    Fatty liver 05/2012   u/s done for mild/persistent elevation of LFTs   GI bleed    Hearing impairment    11/2015 Audiology eval= not yet ready for hearing aids ([facility])   History of adenomatous polyp of colon    On multiple colonoscopies: most recent colonoscopy 05/31/18-->adenoma x 2, repeat 3-5 ys is optional.   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   Microhematuria 09/2017   On dipstick only.  Urine micro 09/2017 and 04/2018-->no RBCs.   OAB (overactive bladder)    ditropan xl 10 ineffective--pt d/c'd this 09/2018   Obesity    Osteoarthritis    Hips (L>R on x-ray 04/2013), shoulders, and knees (hx of adhesive capsulitis of shoulder)   Prediabetes    A1c 6.1% 08/2019 at [facility]   Rectal leakage    UGI bleed 10/26/2021   duodenal ulcer-->transfused 2 U pRBCs    Family History  Problem Relation Age of Onset   Hypertension Father    Alcoholism Father    Stomach cancer Father        primary with mets to colon   Colon cancer Father    Dementia Mother    Asthma Brother    Diabetes Sister    Colon polyps Neg Hx    Esophageal cancer Neg Hx    Rectal cancer  Neg Hx     Past Surgical History:  Procedure Laterality Date   BIOPSY  10/27/2021   Procedure: BIOPSY;  Surgeon: Thornton Park, MD;  Location: [facility] ENDOSCOPY;  Service: Gastroenterology;;   COLONOSCOPY     2008 w/polyps,  09-22-2011 w/polyps.  01/2015 tubular adenoma x 1.  05/31/18 colonoscopy w/ two tubular adenomas.   COLONOSCOPY W/ POLYPECTOMY  2008; 09/22/11;01/2015   Tubular adenoma--recall 3 yrs per Dr. Louanne Belton at [place].  Then switched to Dr. Hilarie Fredrickson with [place].   ESOPHAGOGASTRODUODENOSCOPY  10/27/2021   duodenal ulcer, h pylori NEG   ESOPHAGOGASTRODUODENOSCOPY (EGD) WITH PROPOFOL N/A 10/27/2021   Procedure: ESOPHAGOGASTRODUODENOSCOPY (EGD) WITH PROPOFOL;  Surgeon: Thornton Park, MD;  Location: [place];  Service: Gastroenterology;  Laterality: N/A;   POLYPECTOMY     REVERSE SHOULDER ARTHROPLASTY Left 10/17/2021   Procedure: LEFT REVERSE SHOULDER ARTHROPLASTY;  Surgeon: Meredith Pel, MD;  Location: [place];  Service: Orthopedics;  Laterality: Left;   TONSILLECTOMY     TRANSTHORACIC ECHOCARDIOGRAM  10/27/2021   10/2021: grd I DD, o/w normal   UMBILICAL HERNIA REPAIR  2011   WISDOM TOOTH EXTRACTION Bilateral    Social History   Occupational History   Not on file  Tobacco Use   Smoking status: Never   Smokeless tobacco: Never  Vaping Use   Vaping Use: Never used  Substance and Sexual Activity   Alcohol use: Yes    Alcohol/week: 1.0 standard drink of alcohol    Types: 1 Cans of beer per week    Comment: occassional   Drug use: No   Sexual activity: Yes

## 2022-03-16 ENCOUNTER — Telehealth: Payer: Self-pay | Admitting: Orthopedic Surgery

## 2022-03-16 NOTE — Telephone Encounter (Signed)
Pt called requesting a call from Dr Marlou Sa. Pt states he is up to renew script of gabapentin but states medication is really not helping and would like to discuss with Dr Marlou Sa. Please call pt at [phone].

## 2022-03-17 NOTE — Telephone Encounter (Signed)
Can we order nerve conduction study of the left upper extremity for evaluation of ulnar neuropathy?  He can follow-up after that study to review with Dr. Marlou Sa or myself.  Have discussed his ulnar nerve symptoms at previous visits.

## 2022-03-20 ENCOUNTER — Other Ambulatory Visit: Payer: Self-pay

## 2022-03-20 DIAGNOSIS — M79602 Pain in left arm: Secondary | ICD-10-CM

## 2022-03-20 DIAGNOSIS — M19012 Primary osteoarthritis, left shoulder: Secondary | ICD-10-CM

## 2022-03-20 DIAGNOSIS — Z96612 Presence of left artificial shoulder joint: Secondary | ICD-10-CM

## 2022-03-20 NOTE — Telephone Encounter (Signed)
Order in chart. Called pt to advise voiced understanding.  He will call with any other questions otherwise will await sch  for NCS with FN.

## 2022-03-29 ENCOUNTER — Encounter: Payer: Self-pay | Admitting: Podiatry

## 2022-03-29 ENCOUNTER — Ambulatory Visit (INDEPENDENT_AMBULATORY_CARE_PROVIDER_SITE_OTHER): Payer: Medicare Other | Admitting: Podiatry

## 2022-03-29 DIAGNOSIS — M79674 Pain in right toe(s): Secondary | ICD-10-CM | POA: Diagnosis not present

## 2022-03-29 DIAGNOSIS — M79675 Pain in left toe(s): Secondary | ICD-10-CM | POA: Diagnosis not present

## 2022-03-29 DIAGNOSIS — B351 Tinea unguium: Secondary | ICD-10-CM

## 2022-03-29 DIAGNOSIS — I739 Peripheral vascular disease, unspecified: Secondary | ICD-10-CM

## 2022-03-31 ENCOUNTER — Encounter: Payer: Self-pay | Admitting: Physical Medicine & Rehabilitation

## 2022-03-31 ENCOUNTER — Encounter: Payer: Medicare Other | Attending: Physical Medicine & Rehabilitation | Admitting: Physical Medicine & Rehabilitation

## 2022-03-31 VITALS — BP 126/76 | HR 66 | Ht 67.0 in | Wt 221.4 lb

## 2022-03-31 DIAGNOSIS — R200 Anesthesia of skin: Secondary | ICD-10-CM | POA: Insufficient documentation

## 2022-03-31 NOTE — Progress Notes (Signed)
EMG/NCV of upper extremities performed today .  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  Pt to follow up with Dr Marlou Sa to discuss results

## 2022-04-02 NOTE — Progress Notes (Signed)
  Subjective:  Patient ID: Gregory Blevins, male    DOB: 11-08-1940,  MRN: [id number]  Gregory Blevins presents to clinic today for for at risk foot care. Patient has h/o PAD and painful elongated mycotic toenails 1-5 bilaterally which are tender when wearing enclosed shoe gear. Pain is relieved with periodic professional debridement.  New problem(s): None.   PCP is [name], [name], MD , and last visit was  November 04, 2021.  No Known Allergies  Review of Systems: Negative except as noted in the HPI.  Objective: No changes noted in today's physical examination.  Gregory Blevins is a pleasant 81 y.o. male in NAD. AAO x 3.  Neurovascular Examination: CFT <4 seconds b/l LE. Faintly palpable DP pulses b/l LE. Nonpalpable PT pulse(s) b/l LE. Pedal hair absent. No pain with calf compression b/l. Trace edema noted BLE.  Protective sensation intact 5/5 intact bilaterally with 10g monofilament b/l.  Dermatological:  Pedal integument with normal turgor, texture and tone BLE. Toenails 1-5 b/l elongated, discolored, dystrophic, thickened, crumbly with subungual debris and tenderness to dorsal palpation. No hyperkeratotic nor porokeratotic lesions present on today's visit.  Musculoskeletal:  Muscle strength 5/5 to all lower extremity muscle groups bilaterally. No pain, crepitus or joint limitation noted with ROM b/l lower extremities. Limited joint ROM to the 1st MPJ b/l.  Assessment/Plan: 1. Pain due to onychomycosis of toenails of both feet   2. PAD (peripheral artery disease) ([place])     -Examined patient. -No new findings. No new orders. -Patient to continue soft, supportive shoe gear daily. -Toenails 1-5 bilaterally were debrided in length and girth with sterile nail nippers and dremel. Pinpoint bleeding of left great toe and R 4th toe addressed with Lumicain Hemostatic Solution, cleansed with alcohol. triple antibiotic ointment applied. No further treatment required by  patient/caregiver. -Patient/POA to call should there be question/concern in the interim.   Return in about 3 months (around 06/28/2022).  Marzetta Board, DPM

## 2022-04-09 ENCOUNTER — Emergency Department (HOSPITAL_BASED_OUTPATIENT_CLINIC_OR_DEPARTMENT_OTHER): Payer: Medicare Other

## 2022-04-09 ENCOUNTER — Other Ambulatory Visit: Payer: Self-pay

## 2022-04-09 ENCOUNTER — Encounter (HOSPITAL_BASED_OUTPATIENT_CLINIC_OR_DEPARTMENT_OTHER): Payer: Self-pay | Admitting: Emergency Medicine

## 2022-04-09 ENCOUNTER — Emergency Department (HOSPITAL_BASED_OUTPATIENT_CLINIC_OR_DEPARTMENT_OTHER)
Admission: EM | Admit: 2022-04-09 | Discharge: 2022-04-09 | Disposition: A | Discharge status: Home / Self Care | Payer: Medicare Other | Attending: Student | Admitting: Student

## 2022-04-09 DIAGNOSIS — R42 Dizziness and giddiness: Secondary | ICD-10-CM | POA: Diagnosis not present

## 2022-04-09 DIAGNOSIS — R112 Nausea with vomiting, unspecified: Secondary | ICD-10-CM | POA: Diagnosis not present

## 2022-04-09 DIAGNOSIS — Z79899 Other long term (current) drug therapy: Secondary | ICD-10-CM | POA: Insufficient documentation

## 2022-04-09 DIAGNOSIS — Z85828 Personal history of other malignant neoplasm of skin: Secondary | ICD-10-CM | POA: Diagnosis not present

## 2022-04-09 DIAGNOSIS — I10 Essential (primary) hypertension: Secondary | ICD-10-CM | POA: Insufficient documentation

## 2022-04-09 DIAGNOSIS — D72829 Elevated white blood cell count, unspecified: Secondary | ICD-10-CM | POA: Insufficient documentation

## 2022-04-09 DIAGNOSIS — R944 Abnormal results of kidney function studies: Secondary | ICD-10-CM | POA: Insufficient documentation

## 2022-04-09 LAB — CBC WITH DIFFERENTIAL/PLATELET
Abs Immature Granulocytes: 0.06 10*3/uL (ref 0.00–0.07)
Basophils Absolute: 0 10*3/uL (ref 0.0–0.1)
Basophils Relative: 0 %
Eosinophils Absolute: 0.1 10*3/uL (ref 0.0–0.5)
Eosinophils Relative: 0 %
HCT: 41.8 % (ref 39.0–52.0)
Hemoglobin: 13.9 g/dL (ref 13.0–17.0)
Immature Granulocytes: 0 %
Lymphocytes Relative: 7 %
Lymphs Abs: 1.1 10*3/uL (ref 0.7–4.0)
MCH: 28.9 pg (ref 26.0–34.0)
MCHC: 33.3 g/dL (ref 30.0–36.0)
MCV: 86.9 fL (ref 80.0–100.0)
Monocytes Absolute: 0.6 10*3/uL (ref 0.1–1.0)
Monocytes Relative: 4 %
Neutro Abs: 14 10*3/uL — ABNORMAL HIGH (ref 1.7–7.7)
Neutrophils Relative %: 89 %
Platelets: 161 10*3/uL (ref 150–400)
RBC: 4.81 MIL/uL (ref 4.22–5.81)
RDW: 13.8 % (ref 11.5–15.5)
WBC: 15.8 10*3/uL — ABNORMAL HIGH (ref 4.0–10.5)
nRBC: 0 % (ref 0.0–0.2)

## 2022-04-09 LAB — COMPREHENSIVE METABOLIC PANEL
ALT: 23 U/L (ref 0–44)
AST: 23 U/L (ref 15–41)
Albumin: 4 g/dL (ref 3.5–5.0)
Alkaline Phosphatase: 94 U/L (ref 38–126)
Anion gap: 7 (ref 5–15)
BUN: 27 mg/dL — ABNORMAL HIGH (ref 8–23)
CO2: 25 mmol/L (ref 22–32)
Calcium: 8.6 mg/dL — ABNORMAL LOW (ref 8.9–10.3)
Chloride: 105 mmol/L (ref 98–111)
Creatinine, Ser: 1.26 mg/dL — ABNORMAL HIGH (ref 0.61–1.24)
GFR, Estimated: 58 mL/min — ABNORMAL LOW (ref >60)
Glucose, Bld: 168 mg/dL — ABNORMAL HIGH (ref 70–99)
Potassium: 4.5 mmol/L (ref 3.5–5.1)
Sodium: 137 mmol/L (ref 135–145)
Total Bilirubin: 0.8 mg/dL (ref 0.3–1.2)
Total Protein: 7.5 g/dL (ref 6.5–8.1)

## 2022-04-09 LAB — SALICYLATE LEVEL: Salicylate Lvl: <7 mg/dL — ABNORMAL LOW (ref 7.0–30.0)

## 2022-04-09 MED ORDER — LORAZEPAM 2 MG/ML IJ SOLN
0.5000 mg | Freq: Once | INTRAMUSCULAR | Status: AC
  Administered 2022-04-09: 0.5 mg via INTRAVENOUS
  Filled 2022-04-09: qty 1

## 2022-04-09 MED ORDER — LACTATED RINGERS IV BOLUS
1000.0000 mL | Freq: Once | INTRAVENOUS | Status: AC
  Administered 2022-04-09: 1000 mL via INTRAVENOUS

## 2022-04-09 MED ORDER — PROCHLORPERAZINE EDISYLATE 10 MG/2ML IJ SOLN
10.0000 mg | Freq: Once | INTRAMUSCULAR | Status: AC
  Administered 2022-04-09: 10 mg via INTRAVENOUS
  Filled 2022-04-09: qty 2

## 2022-04-09 NOTE — ED Provider Notes (Signed)
[facility] Provider Note  CSN: [id number] Arrival date & time: 04/09/22 1513  Chief Complaint(s) Emesis and Dizziness  HPI Gregory Blevins is a 81 y.o. male with PMH duodenal ulcer with esophagitis and associated GI bleed, HTN, HLD who presents emergency department for evaluation of dizziness and vomiting.  Patient states that at approximately 11 AM on 04/09/2022 he had sudden onset vertigo and ringing in his left ear.  He states that his vertigo is significant worse with rapid head movement and improves when keeping his head still.  Symptoms worse when he looks down.  Endorses multiple episodes of vomiting associated with this.  He states that he has taken meclizine at home without improvement and has received multiple doses of Zofran prior to arrival.  Denies new numbness, tingling, weakness or other neurologic complaints.  Initial NIH 0.   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   Microhematuria 09/2017   On dipstick only.  Urine micro 09/2017 and 04/2018-->no RBCs.   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   Primary osteoarthritis, left shoulder 09/19/2021   Prostate cancer screening 10/21/2015   Preventative health care 04/10/2014   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/22/2013   Facet arthropathy, lumbar 04/22/2013   Pelvic pain in male 03/21/2013   Health maintenance examination 09/27/2012   HTN (hypertension), benign 09/27/2012   Basal cell carcinoma of skin 07/12/2011   Colon polyp 07/12/2011   High risk medication use 07/12/2011   Elevation of level of transaminase or lactic acid dehydrogenase (LDH) 08/31/2010   Onychomycosis due to dermatophyte 08/31/2010   Osteoarthrosis involving lower leg 08/31/2010   Other and unspecified hyperlipidemia 08/31/2010   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 Authorizing Provider  acetaminophen (TYLENOL) 500 MG tablet Take 500-1,000 mg by mouth in the morning and at bedtime.    [provider]  atorvastatin (LIPITOR) 20 MG tablet TAKE 1 TABLET BY MOUTH EVERY DAY 01/16/22   [name], [name], MD  cholecalciferol (VITAMIN D) 25 MCG (1000 UNIT) tablet Take 1,000 Units by mouth daily.    [provider]  furosemide (LASIX) 20 MG tablet 1 tab po every other day Patient taking differently: Take 20 mg by mouth every other day. 04/15/21   [name], [name], MD  gabapentin (NEURONTIN) 100 MG capsule Take 1 capsule (100 mg total) by mouth 3 (three) times  daily. 01/27/22   Magnant, Charles L, PA-C  lisinopril (ZESTRIL) 30 MG tablet TAKE 1 TABLET BY MOUTH EVERY DAY 12/06/21   [name], [name], MD  pantoprazole (PROTONIX) 40 MG tablet Take 1 tablet (40 mg total) by mouth 2 (two) times daily before a meal. 12/01/21   Pyrtle, Lajuan Lines, MD  senna (SENOKOT) 8.6 MG TABS tablet Take 1 tablet by mouth daily as needed for mild constipation or moderate constipation.    [provider]                                                                                                                                    Past Surgical History Past Surgical History:  Procedure Laterality Date   BIOPSY  10/27/2021   Procedure: BIOPSY;  Surgeon: Thornton Park, MD;  Location: [facility] ENDOSCOPY;  Service: Gastroenterology;;   COLONOSCOPY     2008 w/polyps,  09-22-2011 w/polyps.  01/2015 tubular adenoma x 1.  05/31/18 colonoscopy w/ two tubular adenomas.   COLONOSCOPY W/ POLYPECTOMY  2008; 09/22/11;01/2015   Tubular adenoma--recall 3 yrs per Dr. Louanne Belton at [place].  Then switched to Dr. Hilarie Fredrickson with [facility].   ESOPHAGOGASTRODUODENOSCOPY  10/27/2021   duodenal ulcer, h pylori NEG   ESOPHAGOGASTRODUODENOSCOPY (EGD) WITH PROPOFOL N/A 10/27/2021   Procedure: ESOPHAGOGASTRODUODENOSCOPY (EGD) WITH PROPOFOL;  Surgeon: Thornton Park, MD;  Location: [facility];  Service: Gastroenterology;  Laterality: N/A;   POLYPECTOMY     REVERSE SHOULDER ARTHROPLASTY Left 10/17/2021   Procedure: LEFT REVERSE SHOULDER ARTHROPLASTY;  Surgeon: Meredith Pel, MD;  Location: [facility];  Service: Orthopedics;  Laterality: Left;   TONSILLECTOMY     TRANSTHORACIC ECHOCARDIOGRAM  10/27/2021   10/2021: grd I DD, o/w normal   UMBILICAL HERNIA REPAIR  2011   WISDOM TOOTH EXTRACTION Bilateral    Family History Family History  Problem Relation Age of Onset   Hypertension Father    Alcoholism Father    Stomach cancer Father        primary with mets to colon   Colon cancer Father     Dementia Mother    Asthma Brother    Diabetes Sister    Colon polyps Neg Hx    Esophageal cancer Neg Hx    Rectal cancer Neg Hx     Social History Social History   Tobacco Use   Smoking status: Never   Smokeless tobacco:  Never  Vaping Use   Vaping Use: Never used  Substance Use Topics   Alcohol use: Yes    Alcohol/week: 1.0 standard drink of alcohol    Types: 1 Cans of beer per week    Comment: occassional   Drug use: No   Allergies Patient has no known allergies.  Review of Systems Review of Systems  Gastrointestinal:  Positive for nausea and vomiting.  Neurological:  Positive for dizziness.    Physical Exam Vital Signs  I have reviewed the triage vital signs BP (!) 146/65   Pulse 67   Temp 98.2 F (36.8 C) (Oral)   Resp 20   Ht 5' 7"  (1.702 m)   Wt 104.3 kg   SpO2 97%   BMI 36.02 kg/m   Physical Exam Constitutional:      General: He is not in acute distress.    Appearance: Normal appearance.  HENT:     Head: Normocephalic and atraumatic.     Nose: No congestion or rhinorrhea.  Eyes:     General:        Right eye: No discharge.        Left eye: No discharge.     Extraocular Movements: Extraocular movements intact.     Pupils: Pupils are equal, round, and reactive to light.  Cardiovascular:     Rate and Rhythm: Normal rate and regular rhythm.     Heart sounds: No murmur heard. Pulmonary:     Effort: No respiratory distress.     Breath sounds: No wheezing or rales.  Abdominal:     General: There is no distension.     Tenderness: There is no abdominal tenderness.  Musculoskeletal:        General: Normal range of motion.     Cervical back: Normal range of motion.  Skin:    General: Skin is warm and dry.  Neurological:     General: No focal deficit present.     Mental Status: He is alert.     Comments: Nystagmus worse with lateral gaze and vertical gaze     ED Results and Treatments Labs (all labs ordered are listed, but only abnormal  results are displayed) Labs Reviewed  CBC WITH DIFFERENTIAL/PLATELET - Abnormal; Notable for the following components:      Result Value   WBC 15.8 (*)    Neutro Abs 14.0 (*)    All other components within normal limits  COMPREHENSIVE METABOLIC PANEL  SALICYLATE LEVEL                                                                                                                          Radiology No results found.  Pertinent labs & imaging results that were available during my care of the patient were reviewed by me and considered in my medical decision making (see MDM for details).  Medications Ordered in ED Medications  prochlorperazine (COMPAZINE) injection 10 mg (has no administration in time range)  LORazepam (  ATIVAN) injection 0.5 mg (has no administration in time range)  lactated ringers bolus 1,000 mL (has no administration in time range)                                                                                                                                     Procedures Procedures  (including critical care time)  Medical Decision Making / ED Course   This patient presents to the ED for concern of dizziness, this involves an extensive number of treatment options, and is a complaint that carries with it a high risk of complications and morbidity.  The differential diagnosis includes BPPV, CVA, electrolyte abnormality, dehydration labyrinth-itis, Mnire's disease, salicylate toxicity  MDM: Patient seen emergency room for evaluation of dizziness nausea and vomiting.  Physical exam reveals nystagmus on exam and symptoms are reproducibly worse with head movement.  Neurologic exam otherwise unremarkable with no focal motor or sensory deficits.  Laboratory evaluation with a leukocytosis to 15.8 likely elevated in setting of stress demargination from the patient's current vomiting.  Creatinine 1.26, BUN 27 which is a mild elevation for this patient.  Salicylate level is  normal and ordered due to the patient's dizziness and tinnitus.  CT head unremarkable.  Patient given a headache cocktail with Compazine Ativan and fluids and on reevaluation his symptoms have significantly improved.  Given that his symptoms were reproducible with rapid head movement and fatigable with standstill, suspect peripheral vertigo and BPPV at this time.  I did offer the patient advanced imaging including CT angio and MRI but after shared decision making, we decided to forego these imaging studies and opt for closer outpatient follow-up of which she has follow-up with his orthopedist tomorrow and his PCP the following day.  He was given strict return precautions of which he voiced understanding he was discharged.   Additional history obtained: -Additional history obtained from wife -External records from outside source obtained and reviewed including: Chart review including previous notes, labs, imaging, consultation notes   Lab Tests: -I ordered, reviewed, and interpreted labs.   The pertinent results include:   Labs Reviewed  CBC WITH DIFFERENTIAL/PLATELET - Abnormal; Notable for the following components:      Result Value   WBC 15.8 (*)    Neutro Abs 14.0 (*)    All other components within normal limits  COMPREHENSIVE METABOLIC PANEL  SALICYLATE LEVEL      EKG   EKG Interpretation  Date/Time:  Sunday April 09 2022 15:42:04 EDT Ventricular Rate:  75 PR Interval:  161 QRS Duration: 96 QT Interval:  387 QTC Calculation: 433 R Axis:   38 Text Interpretation: Sinus rhythm Abnormal R-wave progression, early transition Confirmed by Harlow Carrizales ([id number]) on 04/09/2022 3:59:21 PM         Imaging Studies ordered: I ordered imaging studies including CT head I independently visualized and interpreted imaging. I agree with the radiologist interpretation   Medicines  ordered and prescription drug management: Meds ordered this encounter  Medications   prochlorperazine  (COMPAZINE) injection 10 mg   LORazepam (ATIVAN) injection 0.5 mg   lactated ringers bolus 1,000 mL    -I have reviewed the patients home medicines and have made adjustments as needed  Critical interventions none   Cardiac Monitoring: The patient was maintained on a cardiac monitor.  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   Microhematuria 09/2017   On dipstick only.  Urine micro 09/2017 and 04/2018-->no RBCs.   OAB (overactive bladder)    ditropan xl 10 ineffective--pt d/c'd this 09/2018   Obesity    Osteoarthritis    Hips (L>R on x-ray 04/2013), shoulders, and knees (hx of adhesive capsulitis of shoulder)   Prediabetes    A1c 6.1% 08/2019 at [facility]   Rectal leakage    UGI bleed 10/26/2021   duodenal ulcer-->transfused 2 U pRBCs      Dispostion: I considered admission for this patient, but with improvement of symptoms and symptoms consistent with peripheral  vertigo, patient safe for discharge with outpatient follow-up     Final Clinical Impression(s) / ED Diagnoses Final diagnoses:  None     @PCDICTATION @    Teressa Lower, MD 04/09/22 2115

## 2022-04-09 NOTE — ED Triage Notes (Signed)
Pt with sudden onset dizziness and NV at 1130; Hx of vertigo; had Meclizine 40m x 2 tabs and Zofran odt at home; EMS gave Zofran IV en route; sts nausea improved, no change in dizziness

## 2022-04-09 NOTE — ED Notes (Signed)
Pt c/o increased dizziness while in wheelchair. Transferred from locked wheelchair to lowered stretcher via [place] in Fowler's Position in bed, pt began actively vomiting. RN aware and at bedside.

## 2022-04-09 NOTE — ED Notes (Signed)
Patient reports improvement in dizziness, patient is drowsy from Ativan administration

## 2022-04-10 ENCOUNTER — Ambulatory Visit (INDEPENDENT_AMBULATORY_CARE_PROVIDER_SITE_OTHER): Payer: Medicare Other | Admitting: Surgical

## 2022-04-10 DIAGNOSIS — G5620 Lesion of ulnar nerve, unspecified upper limb: Secondary | ICD-10-CM | POA: Diagnosis not present

## 2022-04-13 ENCOUNTER — Encounter: Payer: Self-pay | Admitting: Family Medicine

## 2022-04-13 ENCOUNTER — Ambulatory Visit (INDEPENDENT_AMBULATORY_CARE_PROVIDER_SITE_OTHER): Payer: Medicare Other | Admitting: Family Medicine

## 2022-04-13 VITALS — BP 121/64 | HR 67 | Temp 98.3°F | Ht 67.0 in | Wt 222.2 lb

## 2022-04-13 DIAGNOSIS — Z23 Encounter for immunization: Secondary | ICD-10-CM

## 2022-04-13 DIAGNOSIS — E7800 Pure hypercholesterolemia, unspecified: Secondary | ICD-10-CM | POA: Diagnosis not present

## 2022-04-13 DIAGNOSIS — D72829 Elevated white blood cell count, unspecified: Secondary | ICD-10-CM

## 2022-04-13 DIAGNOSIS — N1831 Chronic kidney disease, stage 3a: Secondary | ICD-10-CM

## 2022-04-13 DIAGNOSIS — M79605 Pain in left leg: Secondary | ICD-10-CM | POA: Diagnosis not present

## 2022-04-13 DIAGNOSIS — H8102 Meniere's disease, left ear: Secondary | ICD-10-CM

## 2022-04-13 DIAGNOSIS — R42 Dizziness and giddiness: Secondary | ICD-10-CM

## 2022-04-13 DIAGNOSIS — R7303 Prediabetes: Secondary | ICD-10-CM | POA: Diagnosis not present

## 2022-04-13 DIAGNOSIS — I10 Essential (primary) hypertension: Secondary | ICD-10-CM

## 2022-04-13 LAB — CBC WITH DIFFERENTIAL/PLATELET
Basophils Absolute: 0.1 10*3/uL (ref 0.0–0.1)
Basophils Relative: 0.7 % (ref 0.0–3.0)
Eosinophils Absolute: 0.1 10*3/uL (ref 0.0–0.7)
Eosinophils Relative: 1.1 % (ref 0.0–5.0)
HCT: 42.8 % (ref 39.0–52.0)
Hemoglobin: 14.1 g/dL (ref 13.0–17.0)
Lymphocytes Relative: 13.7 % (ref 12.0–46.0)
Lymphs Abs: 1.4 10*3/uL (ref 0.7–4.0)
MCHC: 33 g/dL (ref 30.0–36.0)
MCV: 88.5 fl (ref 78.0–100.0)
Monocytes Absolute: 0.6 10*3/uL (ref 0.1–1.0)
Monocytes Relative: 6.1 % (ref 3.0–12.0)
Neutro Abs: 8.2 10*3/uL — ABNORMAL HIGH (ref 1.4–7.7)
Neutrophils Relative %: 78.4 % — ABNORMAL HIGH (ref 43.0–77.0)
Platelets: 183 10*3/uL (ref 150.0–400.0)
RBC: 4.84 Mil/uL (ref 4.22–5.81)
RDW: 14.8 % (ref 11.5–15.5)
WBC: 10.4 10*3/uL (ref 4.0–10.5)

## 2022-04-13 LAB — BASIC METABOLIC PANEL
BUN: 26 mg/dL — ABNORMAL HIGH (ref 6–23)
CO2: 27 mEq/L (ref 19–32)
Calcium: 9.4 mg/dL (ref 8.4–10.5)
Chloride: 104 mEq/L (ref 96–112)
Creatinine, Ser: 1.21 mg/dL (ref 0.40–1.50)
GFR: 56.36 mL/min — ABNORMAL LOW (ref >60.00)
Glucose, Bld: 94 mg/dL (ref 70–99)
Potassium: 5 mEq/L (ref 3.5–5.1)
Sodium: 139 mEq/L (ref 135–145)

## 2022-04-13 LAB — LIPID PANEL
Cholesterol: 124 mg/dL (ref 0–200)
HDL: 40.8 mg/dL (ref >39.00)
LDL Cholesterol: 57 mg/dL (ref 0–99)
NonHDL: 82.71
Total CHOL/HDL Ratio: 3
Triglycerides: 127 mg/dL (ref 0.0–149.0)
VLDL: 25.4 mg/dL (ref 0.0–40.0)

## 2022-04-13 LAB — POCT GLYCOSYLATED HEMOGLOBIN (HGB A1C)
HbA1c POC (<> result, manual entry): 5.1 % (ref 4.0–5.6)
HbA1c, POC (controlled diabetic range): 5.1 % (ref 0.0–7.0)
HbA1c, POC (prediabetic range): 5.1 % — AB (ref 5.7–6.4)
Hemoglobin A1C: 5.1 % (ref 4.0–5.6)

## 2022-04-13 MED ORDER — LISINOPRIL 30 MG PO TABS: ORAL_TABLET | ORAL | 3 refills | Status: DC

## 2022-04-13 MED ORDER — ATORVASTATIN CALCIUM 20 MG PO TABS: 20.0000 mg | ORAL_TABLET | Freq: Every day | ORAL | 3 refills | Status: DC

## 2022-04-13 MED ORDER — FUROSEMIDE 20 MG PO TABS: ORAL_TABLET | ORAL | 3 refills | Status: DC

## 2022-04-13 NOTE — Progress Notes (Signed)
OFFICE VISIT  04/13/2022  CC:  Chief Complaint  Patient presents with   Hypertension   Hyperlipidemia    Pt is not fasting   Prediabetes   Patient is a 81 y.o. male who presents for f/u HTN, HLD, bilat LE edema, and prediabetes. He underwent left reverse total shoulder arthroplasty 10/17/21 with Dr. Marlou Sa. A few weeks later he had iron deficiency anemia due to upper GI bleed and required transfusions.  INTERIM HX: He presented to the emergency department on 04/09/2022 for vertigo.  It appeared to be BPPV.  White blood cell count was a little elevated similar to past measurements over the last few years. Serum creatinine was at his baseline of 1.26, glucose 168. No medication changes were made.  Describes onset of complete hearing loss and howling-type ringing in ear several days ago, next day got acute vertigo-->ED-->IVF and felt fine.  Similar episode 1 yr ago. He is back to his baseline hearing impairment and chronic tinnitus, now more in R ear. No further vertigo.  He describes pain in the left calf for the last couple of months.  Denies any change in chronic bilateral lower extremity swelling.  Takes lasix 66m qod for LE edema. L calf painful lately.  He will be getting evaluated for ulnar nerve compression, possible surgery (left).  ROS as above, plus--> no fevers, no CP, no SOB, no wheezing, no cough, no HAs, no rashes, no melena/hematochezia.  No polyuria or polydipsia.  No myalgias or arthralgias.  No focal weakness, paresthesias, or tremors.  No acute vision or hearing abnormalities.  No dysuria or unusual/new urinary urgency or frequency.  No n/v/d or abd pain.  No palpitations.    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   Microhematuria 09/2017   On dipstick only.  Urine micro 09/2017 and 04/2018-->no RBCs.   OAB (overactive bladder)    ditropan xl 10 ineffective--pt d/c'd this 09/2018   Obesity    Osteoarthritis    Hips (L>R on x-ray 04/2013), shoulders, and knees (hx of adhesive capsulitis of shoulder)   Prediabetes    [facility]  Rectal leakage    UGI bleed 10/26/2021   duodenal ulcer-->transfused 2 U pRBCs    Past Surgical History:  Procedure Laterality Date   BIOPSY  10/27/2021   Procedure: BIOPSY;  Surgeon: BThornton Park MD;  [facility];  Service: Gastroenterology;;   COLONOSCOPY     2008 w/polyps,  09-22-2011 w/polyps.  01/2015 tubular adenoma x 1.  05/31/18 colonoscopy w/ two tubular adenomas.   COLONOSCOPY W/ POLYPECTOMY  2008; 09/22/11;01/2015   Tubular adenoma--recall 3 yrs [facility] [place] [place]  Then switched to Dr.  Pyrtle with [facility].   ESOPHAGOGASTRODUODENOSCOPY  10/27/2021   duodenal ulcer, h pylori NEG   ESOPHAGOGASTRODUODENOSCOPY (EGD) WITH PROPOFOL N/A 10/27/2021   Procedure: ESOPHAGOGASTRODUODENOSCOPY (EGD) WITH PROPOFOL;  Surgeon: Thornton Park, MD;  Location: [facility];  Service: Gastroenterology;  Laterality: N/A;   POLYPECTOMY     REVERSE SHOULDER ARTHROPLASTY Left 10/17/2021   Procedure: LEFT REVERSE SHOULDER ARTHROPLASTY;  Surgeon: Meredith Pel, MD;  Location: [facility];  Service: Orthopedics;  Laterality: Left;   TONSILLECTOMY     TRANSTHORACIC ECHOCARDIOGRAM  10/27/2021   10/2021: grd I DD, o/w  normal   UMBILICAL HERNIA REPAIR  2011   WISDOM TOOTH EXTRACTION Bilateral     Outpatient Medications Prior to Visit  Medication Sig Dispense Refill   acetaminophen (TYLENOL) 500 MG tablet Take 500-1,000 mg by mouth in the morning and at bedtime.     atorvastatin (LIPITOR) 20 MG tablet TAKE 1 TABLET BY MOUTH EVERY DAY 90 tablet 0   cholecalciferol (VITAMIN D) 25 MCG (1000 UNIT) tablet Take 1,000 Units by mouth daily.     furosemide (LASIX) 20 MG tablet 1 tab po every other day (Patient taking differently: Take 20 mg by mouth every other day.) 45 tablet 3   lisinopril (ZESTRIL) 30 MG tablet TAKE 1 TABLET BY MOUTH EVERY DAY 90 tablet 1   senna (SENOKOT) 8.6 MG TABS tablet Take 1 tablet by mouth daily as needed for mild constipation or moderate constipation.     pantoprazole (PROTONIX) 40 MG tablet Take 1 tablet (40 mg total) by mouth 2 (two) times daily before a meal. (Patient not taking: Reported on 04/13/2022) 60 tablet 3   gabapentin (NEURONTIN) 100 MG capsule Take 1 capsule (100 mg total) by mouth 3 (three) times daily. 90 capsule 0   No facility-administered medications prior to visit.    No Known Allergies  ROS As per HPI  PE:    04/13/2022    8:16 AM 04/09/2022    6:00 PM 04/09/2022    5:30 PM  Vitals with BMI  Height 5' 7"     Weight 222 lbs 3 oz    BMI 42.68    Systolic 341 962 229  Diastolic 64 58 63  Pulse 67 64 64     Physical Exam  Gen: Alert, well appearing.  Patient is oriented to person, place, time, and situation. AFFECT: pleasant, lucid thought and speech. LEGS: bilat LL pitting edema 2+ located primarily in ankles, no calf tenderness or nodularity/cord.  No erythema. L calf circ 10 cm below inf border of patella is 39 cm. R calf 37 cm  LABS:  Last CBC Lab Results  Component Value Date   WBC 15.8 (H) 04/09/2022   HGB 13.9 04/09/2022   HCT 41.8 04/09/2022   MCV 86.9 04/09/2022   MCH 28.9 04/09/2022   RDW 13.8 04/09/2022   PLT 161 79/89/2119   Last  metabolic panel Lab Results  Component Value Date   GLUCOSE 168 (H) 04/09/2022   NA 137 04/09/2022   K 4.5 04/09/2022   CL 105 04/09/2022   CO2 25 04/09/2022   BUN 27 (H) 04/09/2022   CREATININE 1.26 (H) 04/09/2022   GFRNONAA 58 (L) 04/09/2022   CALCIUM 8.6 (L) 04/09/2022   PHOS 2.9 10/27/2021   PROT 7.5 04/09/2022   ALBUMIN 4.0 04/09/2022   BILITOT 0.8 04/09/2022   ALKPHOS 94 04/09/2022   AST 23 04/09/2022   ALT 23 04/09/2022   ANIONGAP 7 04/09/2022   Last lipids Lab Results  Component Value Date   CHOL 125 04/15/2021   HDL 43.60 04/15/2021   LDLCALC 57 04/15/2021   LDLDIRECT 76.0 03/22/2016   TRIG 121.0 04/15/2021   CHOLHDL 3 04/15/2021   Last hemoglobin A1c Lab Results  Component Value Date   HGBA1C 5.9 10/13/2021   Last thyroid functions Lab Results  Component Value Date   TSH 1.23 08/13/2019   IMPRESSION AND PLAN:  #1 vertigo ->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 Ordered today.  2. Left calf pain->venous doppler r/o dVt ordered.  #3 hypertension, well controlled on lisinopril 30 mg a day. Electrolytes and creatinine today.  4.  Hypercholesterolemia.  Doing well on atorvastatin 20 mg a day. Nonfasting lipid panel today.  #5 bilateral lower extremity edema. Stable on furosemide 20 mg every other day. Limit sodium, elevate.  #6 prediabetes. Hemoglobin A1c excellent at 5.1% today. Repeat A1c in 1 year.  Vaccines: flu->given today.  An After Visit Summary was printed and given to the patient.  FOLLOW UP: 6 mo RCI  Signed:  Crissie Sickles, MD           04/13/2022

## 2022-04-16 ENCOUNTER — Encounter: Payer: Self-pay | Admitting: Orthopedic Surgery

## 2022-04-16 NOTE — Progress Notes (Signed)
Follow-up Office Visit Note   Patient: Gregory Blevins           Date of Birth: May 28, 1941           MRN: [id number] Visit Date: 04/10/2022 Requested by: Tammi Sou, MD [address] [place],  [place] [address] PCP: Tammi Sou, MD  Subjective: Chief Complaint  Patient presents with   Other     Review EMG/NCV    HPI: Gregory Blevins is a 81 y.o. male who returns to the office for follow-up visit.    Plan at last visit was: Plan at this time is to continue to work on stretching and strengthening of that left arm.  He is happy with his surgical result following left reverse shoulder arthroplasty.  Follow-up as needed.  Since then, patient notes continued discomfort in the left hand in the fourth and fifth fingers.  He notes numbness and tingling that is fairly constant.  Also notes burning pain in the fourth and fifth fingers intermittently with pain that travels down the ulnar aspect of his forearm from the elbow.  No right-sided symptoms.  No significant neck discomfort.  Left shoulder is doing well following shoulder replacement.  Gabapentin helps but still continues to have symptoms though at a lesser degree.              ROS: All systems reviewed are negative as they relate to the chief complaint within the history of present illness.  Patient denies fevers or chills.  Assessment & Plan: Visit Diagnoses:  1. Ulnar neuropathy, unspecified laterality     Plan: Gregory Blevins is a 81 y.o. male who returns to the office for follow-up visit for left arm ulnar neuropathy..  Plan from last visit was noted above in HPI.  They now return with persistent symptoms.  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  Discussed options available to patient including living with his symptoms versus continuing with gabapentin and other medications versus surgical  intervention.  He states this is bothering him enough for surgery.  Plan to refer patient to Dr. Burney Gauze for further intervention.  Patient agreed with plan.  Follow-up as needed.  Follow-Up Instructions: No follow-ups on file.   Orders:  Orders Placed This Encounter  Procedures   Ambulatory referral to Orthopedic Surgery   No orders of the defined types were placed in this encounter.     Procedures: No procedures performed   Clinical Data: No additional findings.  Objective: Vital Signs: There were no vitals taken for this visit.  Physical Exam:  Constitutional: Patient appears well-developed HEENT:  Head: Normocephalic Eyes:EOM are normal Neck: Normal range of motion Cardiovascular: Normal rate Pulmonary/chest: Effort normal Neurologic: Patient is alert Skin: Skin is warm Psychiatric: Patient has normal mood and affect  Ortho Exam: Ortho exam demonstrates left hand with no significant atrophy noted compared with the contralateral hand.  He does have weakness of resisted abduction of the fifth finger on the left hand compared with the right.  There is no interosseous wasting noted.  He has intact EPL, FPL bilaterally.  There is negative Tinel's sign over the carpal tunnel.  No subluxing ulnar nerve noted.  Specialty Comments:  No specialty comments available.  Imaging: No results found.   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   Primary osteoarthritis, left shoulder 09/19/2021   Prostate cancer screening 10/21/2015   Preventative health care 04/10/2014   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/22/2013   Facet arthropathy, lumbar 04/22/2013   Pelvic pain in male 03/21/2013   Health maintenance examination 09/27/2012   HTN (hypertension), benign 09/27/2012   Basal cell carcinoma  of skin 07/12/2011   Colon polyp 07/12/2011   High risk medication use 07/12/2011   Elevation of level of transaminase or lactic acid dehydrogenase (LDH) 08/31/2010   Onychomycosis due to dermatophyte 08/31/2010   Osteoarthrosis involving lower leg 08/31/2010   Other and unspecified hyperlipidemia 08/31/2010   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   Myalgia and myositis, unspecified 10/19/2009   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   Morbid obesity ([place]) 06/17/2009   Night blindness 06/17/2009   Past Medical History:  Diagnosis Date   Chronic left shoulder pain    Severe GH arthr->GH steroid injection 10/2019   Diverticulosis 2013   noted on colonoscopy   Duodenal ulcer    Esophagitis    Fatty liver 05/2012   u/s done for mild/persistent elevation of LFTs   GI bleed    Hearing impairment    11/2015 Audiology eval= not yet ready for hearing aids ([facility])   History of adenomatous polyp of colon    On multiple colonoscopies: most recent colonoscopy 05/31/18-->adenoma x 2, repeat 3-5 ys is optional.   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   Microhematuria 09/2017   On dipstick only.  Urine micro 09/2017 and 04/2018-->no RBCs.   OAB (overactive bladder)    ditropan xl 10 ineffective--pt d/c'd this 09/2018   Obesity    Osteoarthritis    Hips (L>R on x-ray 04/2013), shoulders, and knees (hx of adhesive capsulitis of shoulder)   Prediabetes    A1c 6.1% 08/2019 at [facility]   Rectal leakage    UGI bleed 10/26/2021   duodenal ulcer-->transfused 2 U pRBCs    Family History  Problem Relation Age of Onset   Hypertension Father    Alcoholism Father    Stomach cancer Father        primary with mets to colon   Colon cancer Father    Dementia Mother    Asthma Brother    Diabetes Sister    Colon polyps Neg Hx    Esophageal  cancer Neg Hx    Rectal cancer Neg Hx     Past Surgical History:  Procedure Laterality Date   BIOPSY  10/27/2021   Procedure: BIOPSY;  Surgeon: Thornton Park, MD;  Location: [facility] ENDOSCOPY;  Service: Gastroenterology;;   COLONOSCOPY     2008 w/polyps,  09-22-2011 w/polyps.  01/2015 tubular adenoma x 1.  05/31/18 colonoscopy w/ two tubular adenomas.   COLONOSCOPY W/ POLYPECTOMY  2008; 09/22/11;01/2015   Tubular adenoma--recall 3 yrs per Dr. Louanne Belton at [facility].  Then switched to Dr. Hilarie Fredrickson with [facility].   ESOPHAGOGASTRODUODENOSCOPY  10/27/2021   duodenal ulcer, h pylori NEG   ESOPHAGOGASTRODUODENOSCOPY (EGD) WITH PROPOFOL N/A 10/27/2021   Procedure: ESOPHAGOGASTRODUODENOSCOPY (EGD) WITH PROPOFOL;  Surgeon: Thornton Park, MD;  Location: [facility];  Service: Gastroenterology;  Laterality: N/A;   POLYPECTOMY  REVERSE SHOULDER ARTHROPLASTY Left 10/17/2021   Procedure: LEFT REVERSE SHOULDER ARTHROPLASTY;  Surgeon: Meredith Pel, MD;  Location: [facility];  Service: Orthopedics;  Laterality: Left;   TONSILLECTOMY     TRANSTHORACIC ECHOCARDIOGRAM  10/27/2021   10/2021: grd I DD, o/w normal   UMBILICAL HERNIA REPAIR  2011   WISDOM TOOTH EXTRACTION Bilateral    Social History   Occupational History   Not on file  Tobacco Use   Smoking status: Never   Smokeless tobacco: Never  Vaping Use   Vaping Use: Never used  Substance and Sexual Activity   Alcohol use: Yes    Alcohol/week: 1.0 standard drink of alcohol    Types: 1 Cans of beer per week    Comment: occassional   Drug use: No   Sexual activity: Yes

## 2022-04-18 ENCOUNTER — Ambulatory Visit (HOSPITAL_BASED_OUTPATIENT_CLINIC_OR_DEPARTMENT_OTHER)
Admission: RE | Admit: 2022-04-18 | Discharge: 2022-04-18 | Disposition: A | Discharge status: Home / Self Care | Payer: Medicare Other | Source: Ambulatory Visit | Attending: Family Medicine | Admitting: Family Medicine

## 2022-04-18 ENCOUNTER — Ambulatory Visit (HOSPITAL_BASED_OUTPATIENT_CLINIC_OR_DEPARTMENT_OTHER): Admission: RE | Admit: 2022-04-18 | Payer: Medicare Other | Source: Ambulatory Visit

## 2022-04-18 DIAGNOSIS — M79605 Pain in left leg: Secondary | ICD-10-CM | POA: Diagnosis not present

## 2022-04-18 DIAGNOSIS — M79662 Pain in left lower leg: Secondary | ICD-10-CM | POA: Diagnosis not present

## 2022-04-20 ENCOUNTER — Emergency Department (HOSPITAL_BASED_OUTPATIENT_CLINIC_OR_DEPARTMENT_OTHER): Payer: Medicare Other

## 2022-04-20 ENCOUNTER — Encounter (HOSPITAL_BASED_OUTPATIENT_CLINIC_OR_DEPARTMENT_OTHER): Payer: Self-pay | Admitting: Emergency Medicine

## 2022-04-20 ENCOUNTER — Other Ambulatory Visit: Payer: Self-pay

## 2022-04-20 ENCOUNTER — Emergency Department (HOSPITAL_BASED_OUTPATIENT_CLINIC_OR_DEPARTMENT_OTHER)
Admission: EM | Admit: 2022-04-20 | Discharge: 2022-04-20 | Disposition: A | Discharge status: Home / Self Care | Payer: Medicare Other | Attending: Emergency Medicine | Admitting: Emergency Medicine

## 2022-04-20 DIAGNOSIS — M25572 Pain in left ankle and joints of left foot: Secondary | ICD-10-CM | POA: Insufficient documentation

## 2022-04-20 DIAGNOSIS — I10 Essential (primary) hypertension: Secondary | ICD-10-CM | POA: Insufficient documentation

## 2022-04-20 HISTORY — DX: Primary osteoarthritis, left ankle and foot: M19.072

## 2022-04-20 MED ORDER — DICLOFENAC SODIUM 1 % EX GEL: 2.0000 g | Freq: Four times a day (QID) | CUTANEOUS | 0 refills | Status: DC | PRN

## 2022-04-20 NOTE — ED Triage Notes (Signed)
Pt was having upper leg pain X 1-2 weeks. Had ultrasound that was negative. Woke up yesterday with left ankle pain worse today.

## 2022-04-20 NOTE — Progress Notes (Signed)
Noted.  ED encounter and x-rays reviewed. Diclofenac was rx'd. Agree with plan.

## 2022-04-20 NOTE — Discharge Instructions (Addendum)
You were seen in the emergency room today with ankle discomfort.  Your x-ray showed arthritis which could be causing some discomfort but no other bony abnormality.  We discussed taking Tylenol and using the Voltaren gel as needed for discomfort.  You may continue to follow with your primary care physician or consider seeing your orthopedist if symptoms worsen.

## 2022-04-20 NOTE — ED Provider Notes (Signed)
Emergency Department Provider Note   I have reviewed the triage vital signs and the nursing notes.   HISTORY  Chief Complaint Ankle Pain   HPI Gregory Blevins is a 81 y.o. male past history reviewed below presents emergency department with atraumatic left ankle pain.  He has not noticed appreciable swelling, redness, warmth to the ankle.  No radiation of pain to the foot or knee.  He saw his PCP recently with some pain in the upper leg and was sent for outpatient DVT ultrasound which was negative.  He states that pain is mostly resolved but for the past 2 mornings has noticed pain in his left ankle with putting any weight on it.  He is able to ambulate but has some discomfort.  No clear provoking factor.   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   Microhematuria 09/2017   On dipstick only.  Urine micro 09/2017 and 04/2018-->no RBCs.   OAB (overactive bladder)    ditropan xl 10 ineffective--pt d/c'd this 09/2018   Obesity    Osteoarthritis    Hips (L>R on x-ray 04/2013), shoulders, and knees (hx of adhesive capsulitis of shoulder)   Prediabetes    A1c 6.1% 08/2019 at [facility]   Rectal leakage    UGI bleed 10/26/2021   duodenal ulcer-->transfused 2 U pRBCs    Review of Systems  Constitutional: No  fever/chills Musculoskeletal: Positive left ankle pain.   ____________________________________________   PHYSICAL EXAM:  VITAL SIGNS: ED Triage Vitals  Enc Vitals Group     BP 04/20/22 0406 (!) 146/72     Pulse Rate 04/20/22 0406 80     Resp 04/20/22 0406 18     Temp 04/20/22 0406 98.4 F (36.9 C)     Temp Source 04/20/22 0406 Oral     SpO2 04/20/22 0406 97 %     Weight 04/20/22 0406 225 lb (102.1 kg)     Height 04/20/22 0406 5' 7"  (1.702 m)    Constitutional: Alert and oriented. Well appearing and in no acute distress. Eyes: Conjunctivae are normal.  Head: Atraumatic. Nose: No congestion/rhinnorhea. Mouth/Throat: Mucous membranes are moist.   Neck: No stridor.   Cardiovascular: 2+ DP pulse in the left ankle.  Respiratory: Normal respiratory effort.  Gastrointestinal: No distention.  Musculoskeletal: Mild lower extremity edema bilaterally without focal joint swelling, erythema, warmth.  Normal range of motion of the left ankle.  No proximal fibular tenderness. No midfoot tenderness.  Neurologic:  Normal speech and language.  Skin:  Skin is warm, dry and intact. No rash noted.  ____________________________________________  RADIOLOGY  DG Ankle Complete Left  Result Date: 04/20/2022 CLINICAL DATA:  Worsening left ankle pain. EXAM: LEFT ANKLE COMPLETE -  3+ VIEW COMPARISON:  None Available. FINDINGS: There is no evidence of fracture, dislocation, or joint effusion. Mild degenerative changes are noted at the ankle. Bony densities and moderate degenerative changes are noted at the talonavicular joint suggesting. Soft tissues are unremarkable. IMPRESSION: 1. No acute fracture or dislocation. 2. Mild to moderate degenerative changes at the ankle and talonavicular joint. Electronically Signed   By: Brett Fairy M.D.   On: 04/20/2022 04:43    ____________________________________________   PROCEDURES  Procedure(s) performed:   Procedures  None   ____________________________________________   INITIAL IMPRESSION / ASSESSMENT AND PLAN / ED COURSE  Pertinent labs & imaging results that were available during my care of the patient were reviewed by me and considered in my medical decision making (see chart for details).   This patient is Presenting for Evaluation of ankle pain, which does require a range of treatment options, and is a complaint that involves a moderate risk of morbidity and mortality.  The Differential Diagnoses include fracture, dislocation, OA, inflammatory arthritis, septic joint, etc.   I did obtain Additional Historical Information from wife at bedside.  I decided to review pertinent External Data, and in summary recent outpatient DVT US negative.   Radiologic Tests Ordered, included left ankle x-ray. I independently interpreted the images and agree with radiology interpretation.   Medical Decision Making: Summary: Patient presents emergency department with atraumatic left ankle pain.  No findings on exam to strongly suspect septic joint, gout, dislocation.  Patient with lateral malleolus tenderness although fairly minimal on exam.  Plan for plain film and re-evaluation.   Reevaluation with update and discussion with patient and wife. X-ray with arthritis.  No other bony abnormality.  Exam, as above, is reassuring.  Plan for Voltaren gel and Tylenol at home.  Patient is established with an orthopedist locally as well as a primary care physician.  Discussed expectant management at home with PCP/orthopedics follow-up should symptoms worsen.   Disposition: discharge  ____________________________________________  FINAL CLINICAL IMPRESSION(S) / ED DIAGNOSES  Final diagnoses:  Acute left ankle pain     NEW OUTPATIENT MEDICATIONS STARTED DURING THIS VISIT:  New Prescriptions   DICLOFENAC SODIUM (VOLTAREN) 1 % GEL    Apply 2 g topically 4 (four) times daily as needed.    Note:  This document was prepared  using Dragon voice recognition software and may include unintentional dictation errors.  Nanda Quinton, MD, [facility]    Shadai Mcclane, [facility], MD 04/20/22 [phone]

## 2022-04-25 ENCOUNTER — Other Ambulatory Visit: Payer: Self-pay | Admitting: Family Medicine

## 2022-04-27 ENCOUNTER — Other Ambulatory Visit: Payer: Self-pay | Admitting: Surgical

## 2022-04-27 ENCOUNTER — Other Ambulatory Visit: Payer: Self-pay

## 2022-04-27 NOTE — Telephone Encounter (Signed)
Patient requesting refill on a medication that was filled by hospital doctor.  [facility]   meclizine (ANTIVERT) 25 MG tablet

## 2022-04-27 NOTE — Telephone Encounter (Signed)
RF request for Meclizine LOV: 04/13/22 Next ov: 10/13/22 Last written: 10/10/21 (30,0) Dr.Molpus  Please fill, if appropriate.

## 2022-04-28 MED ORDER — MECLIZINE HCL 25 MG PO TABS: 25.0000 mg | ORAL_TABLET | Freq: Three times a day (TID) | ORAL | 1 refills | Status: DC | PRN

## 2022-04-28 NOTE — Telephone Encounter (Signed)
Pt advised refill sent.

## 2022-05-15 DIAGNOSIS — H9313 Tinnitus, bilateral: Secondary | ICD-10-CM | POA: Diagnosis not present

## 2022-05-15 DIAGNOSIS — H9113 Presbycusis, bilateral: Secondary | ICD-10-CM | POA: Diagnosis not present

## 2022-05-15 DIAGNOSIS — R42 Dizziness and giddiness: Secondary | ICD-10-CM | POA: Insufficient documentation

## 2022-06-05 ENCOUNTER — Telehealth: Payer: Self-pay | Admitting: Surgical

## 2022-06-05 NOTE — Telephone Encounter (Signed)
Pt states he is unable to get in touch with Dr. Arlyn Leak.Marland Kitchenand would like someone to contact him and advise his next step.

## 2022-06-07 NOTE — Telephone Encounter (Signed)
Referral was never sent to me for some reason, I just submitted the referral to [place] [facility]

## 2022-06-13 DIAGNOSIS — M79642 Pain in left hand: Secondary | ICD-10-CM | POA: Diagnosis not present

## 2022-06-13 DIAGNOSIS — G5622 Lesion of ulnar nerve, left upper limb: Secondary | ICD-10-CM | POA: Diagnosis not present

## 2022-06-28 ENCOUNTER — Ambulatory Visit (INDEPENDENT_AMBULATORY_CARE_PROVIDER_SITE_OTHER): Payer: Medicare Other

## 2022-06-28 DIAGNOSIS — Z0000 Encounter for general adult medical examination without abnormal findings: Secondary | ICD-10-CM | POA: Diagnosis not present

## 2022-06-28 NOTE — Patient Instructions (Addendum)
Health Maintenance, Male Adopting a healthy lifestyle and getting preventive care are important in promoting health and wellness. Ask your health care provider about: The right schedule for you to have regular tests and exams. Things you can do on your own to prevent diseases and keep yourself healthy. What should I know about diet, weight, and exercise? Eat a healthy diet  Eat a diet that includes plenty of vegetables, fruits, low-fat dairy products, and lean protein. Do not eat a lot of foods that are high in solid fats, added sugars, or sodium. Maintain a healthy weight Body mass index (BMI) is a measurement that can be used to identify possible weight problems. It estimates body fat based on height and weight. Your health care provider can help determine your BMI and help you achieve or maintain a healthy weight. Get regular exercise Get regular exercise. This is one of the most important things you can do for your health. Most adults should: Exercise for at least 150 minutes each week. The exercise should increase your heart rate and make you sweat (moderate-intensity exercise). Do strengthening exercises at least twice a week. This is in addition to the moderate-intensity exercise. Spend less time sitting. Even light physical activity can be beneficial. Watch cholesterol and blood lipids Have your blood tested for lipids and cholesterol at 81 years of age, then have this test every 5 years. You may need to have your cholesterol levels checked more often if: Your lipid or cholesterol levels are high. You are older than 81 years of age. You are at high risk for heart disease. What should I know about cancer screening? Many types of cancers can be detected early and may often be prevented. Depending on your health history and family history, you may need to have cancer screening at various ages. This may include screening for: Colorectal cancer. Prostate cancer. Skin cancer. Lung  cancer. What should I know about heart disease, diabetes, and high blood pressure? Blood pressure and heart disease High blood pressure causes heart disease and increases the risk of stroke. This is more likely to develop in people who have high blood pressure readings or are overweight. Talk with your health care provider about your target blood pressure readings. Have your blood pressure checked: Every 3-5 years if you are 18-39 years of age. Every year if you are 40 years old or older. If you are between the ages of 65 and 75 and are a current or former smoker, ask your health care provider if you should have a one-time screening for abdominal aortic aneurysm (AAA). Diabetes Have regular diabetes screenings. This checks your fasting blood sugar level. Have the screening done: Once every three years after age 45 if you are at a normal weight and have a low risk for diabetes. More often and at a younger age if you are overweight or have a high risk for diabetes. What should I know about preventing infection? Hepatitis B If you have a higher risk for hepatitis B, you should be screened for this virus. Talk with your health care provider to find out if you are at risk for hepatitis B infection. Hepatitis C Blood testing is recommended for: Everyone born from 1945 through 1965. Anyone with known risk factors for hepatitis C. Sexually transmitted infections (STIs) You should be screened each year for STIs, including gonorrhea and chlamydia, if: You are sexually active and are younger than 81 years of age. You are older than 81 years of age and your   health care provider tells you that you are at risk for this type of infection. Your sexual activity has changed since you were last screened, and you are at increased risk for chlamydia or gonorrhea. Ask your health care provider if you are at risk. Ask your health care provider about whether you are at high risk for HIV. Your health care provider  may recommend a prescription medicine to help prevent HIV infection. If you choose to take medicine to prevent HIV, you should first get tested for HIV. You should then be tested every 3 months for as long as you are taking the medicine. Follow these instructions at home: Alcohol use Do not drink alcohol if your health care provider tells you not to drink. If you drink alcohol: Limit how much you have to 0-2 drinks a day. Know how much alcohol is in your drink. In the [place] one drink equals one 12 oz bottle of beer (355 mL), one 5 oz glass of wine (148 mL), or one 1 oz glass of hard liquor (44 mL). Lifestyle Do not use any products that contain nicotine or tobacco. These products include cigarettes, chewing tobacco, and vaping devices, such as e-cigarettes. If you need help quitting, ask your health care provider. Do not use street drugs. Do not share needles. Ask your health care provider for help if you need support or information about quitting drugs. General instructions Schedule regular health, dental, and eye exams. Stay current with your vaccines. Tell your health care provider if: You often feel depressed. You have ever been abused or do not feel safe at home. Summary Adopting a healthy lifestyle and getting preventive care are important in promoting health and wellness. Follow your health care provider's instructions about healthy diet, exercising, and getting tested or screened for diseases. Follow your health care provider's instructions on monitoring your cholesterol and blood pressure. This information is not intended to replace advice given to you by your health care provider. Make sure you discuss any questions you have with your health care provider. Document Revised: 11/15/2020 Document Reviewed: 11/15/2020 [employer] Patient Education  2023 [employer] Health Maintenance, Male Adopting a healthy lifestyle and getting preventive care are important in promoting health and  wellness. Ask your health care provider about: The right schedule for you to have regular tests and exams. Things you can do on your own to prevent diseases and keep yourself healthy. What should I know about diet, weight, and exercise? Eat a healthy diet  Eat a diet that includes plenty of vegetables, fruits, low-fat dairy products, and lean protein. Do not eat a lot of foods that are high in solid fats, added sugars, or sodium. Maintain a healthy weight Body mass index (BMI) is a measurement that can be used to identify possible weight problems. It estimates body fat based on height and weight. Your health care provider can help determine your BMI and help you achieve or maintain a healthy weight. Get regular exercise Get regular exercise. This is one of the most important things you can do for your health. Most adults should: Exercise for at least 150 minutes each week. The exercise should increase your heart rate and make you sweat (moderate-intensity exercise). Do strengthening exercises at least twice a week. This is in addition to the moderate-intensity exercise. Spend less time sitting. Even light physical activity can be beneficial. Watch cholesterol and blood lipids Have your blood tested for lipids and cholesterol at 81 years of age, then have this test   every 5 years. You may need to have your cholesterol levels checked more often if: Your lipid or cholesterol levels are high. You are older than 81 years of age. You are at high risk for heart disease. What should I know about cancer screening? Many types of cancers can be detected early and may often be prevented. Depending on your health history and family history, you may need to have cancer screening at various ages. This may include screening for: Colorectal cancer. Prostate cancer. Skin cancer. Lung cancer. What should I know about heart disease, diabetes, and high blood pressure? Blood pressure and heart disease High  blood pressure causes heart disease and increases the risk of stroke. This is more likely to develop in people who have high blood pressure readings or are overweight. Talk with your health care provider about your target blood pressure readings. Have your blood pressure checked: Every 3-5 years if you are 18-39 years of age. Every year if you are 40 years old or older. If you are between the ages of 65 and 75 and are a current or former smoker, ask your health care provider if you should have a one-time screening for abdominal aortic aneurysm (AAA). Diabetes Have regular diabetes screenings. This checks your fasting blood sugar level. Have the screening done: Once every three years after age 45 if you are at a normal weight and have a low risk for diabetes. More often and at a younger age if you are overweight or have a high risk for diabetes. What should I know about preventing infection? Hepatitis B If you have a higher risk for hepatitis B, you should be screened for this virus. Talk with your health care provider to find out if you are at risk for hepatitis B infection. Hepatitis C Blood testing is recommended for: Everyone born from 1945 through 1965. Anyone with known risk factors for hepatitis C. Sexually transmitted infections (STIs) You should be screened each year for STIs, including gonorrhea and chlamydia, if: You are sexually active and are younger than 81 years of age. You are older than 81 years of age and your health care provider tells you that you are at risk for this type of infection. Your sexual activity has changed since you were last screened, and you are at increased risk for chlamydia or gonorrhea. Ask your health care provider if you are at risk. Ask your health care provider about whether you are at high risk for HIV. Your health care provider may recommend a prescription medicine to help prevent HIV infection. If you choose to take medicine to prevent HIV, you  should first get tested for HIV. You should then be tested every 3 months for as long as you are taking the medicine. Follow these instructions at home: Alcohol use Do not drink alcohol if your health care provider tells you not to drink. If you drink alcohol: Limit how much you have to 0-2 drinks a day. Know how much alcohol is in your drink. In the [place] one drink equals one 12 oz bottle of beer (355 mL), one 5 oz glass of wine (148 mL), or one 1 oz glass of hard liquor (44 mL). Lifestyle Do not use any products that contain nicotine or tobacco. These products include cigarettes, chewing tobacco, and vaping devices, such as e-cigarettes. If you need help quitting, ask your health care provider. Do not use street drugs. Do not share needles. Ask your health care provider for help if you need support   or information about quitting drugs. General instructions Schedule regular health, dental, and eye exams. Stay current with your vaccines. Tell your health care provider if: You often feel depressed. You have ever been abused or do not feel safe at home. Summary Adopting a healthy lifestyle and getting preventive care are important in promoting health and wellness. Follow your health care provider's instructions about healthy diet, exercising, and getting tested or screened for diseases. Follow your health care provider's instructions on monitoring your cholesterol and blood pressure. This information is not intended to replace advice given to you by your health care provider. Make sure you discuss any questions you have with your health care provider. Document Revised: 11/15/2020 Document Reviewed: 11/15/2020 [employer] Patient Education  2023 [employer]  

## 2022-06-28 NOTE — Progress Notes (Signed)
Subjective:   Gregory Blevins is a 81 y.o. male who presents for Medicare Annual/Subsequent preventive examination.  I connected with  Shawnie Pons on 06/28/22 by an audio only telemedicine application and verified that I am speaking with the correct person using two identifiers.   I discussed the limitations, risks, security and privacy concerns of performing an evaluation and management service by telephone and the availability of in person appointments. I also discussed with the patient that there may be a patient responsible charge related to this service. The patient expressed understanding and verbally consented to this telephonic visit.  Location of Patient: home Location of Provider: office  List any persons and their role that are participating in the visit with the patient.   [place], CMA  Review of Systems    Defer to PCP       Objective:    There were no vitals filed for this visit. There is no height or weight on file to calculate BMI.     06/28/2022   11:47 AM 04/20/2022    4:55 AM 10/27/2021    7:32 PM 10/27/2021   12:32 PM 10/11/2021   11:33 AM 10/10/2021   12:57 AM 06/22/2021   11:04 AM  Advanced Directives  Does Patient Have a Medical Advance Directive? Yes No Yes Yes Yes No Yes  Type of Paramedic of [place];Living will  Healthcare Power of [place];Living will Living will;Healthcare Power of [place]  Does patient want to make changes to medical advance directive? No - Patient declined  No - Patient declined      Copy of [place] in Chart? Yes - validated most recent copy scanned in chart (See row information)  No - copy requested No - copy requested No - copy requested    Would patient like information on creating a medical advance directive?  No - Patient declined         Current Medications (verified) Outpatient Encounter  Medications as of 06/28/2022  Medication Sig   acetaminophen (TYLENOL) 500 MG tablet Take 500-1,000 mg by mouth in the morning and at bedtime.   atorvastatin (LIPITOR) 20 MG tablet Take 1 tablet (20 mg total) by mouth daily.   cholecalciferol (VITAMIN D) 25 MCG (1000 UNIT) tablet Take 1,000 Units by mouth daily.   diclofenac Sodium (VOLTAREN) 1 % GEL Apply 2 g topically 4 (four) times daily as needed.   furosemide (LASIX) 20 MG tablet 1 tab po every other day   lisinopril (ZESTRIL) 30 MG tablet TAKE 1 TABLET BY MOUTH EVERY DAY   meclizine (ANTIVERT) 25 MG tablet Take 1-2 tablets (25-50 mg total) by mouth 3 (three) times daily as needed for dizziness.   pantoprazole (PROTONIX) 40 MG tablet Take 1 tablet (40 mg total) by mouth 2 (two) times daily before a meal. (Patient not taking: Reported on 04/13/2022)   senna (SENOKOT) 8.6 MG TABS tablet Take 1 tablet by mouth daily as needed for mild constipation or moderate constipation.   No facility-administered encounter medications on file as of 06/28/2022.    Allergies (verified) Patient has no known allergies.   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   Microhematuria 09/2017   On dipstick only.  Urine micro 09/2017 and 04/2018-->no RBCs.   OAB (overactive bladder)     ditropan xl 10 ineffective--pt d/c'd this 09/2018   Obesity    Osteoarthritis    Hips (L>R on x-ray 04/2013), shoulders, and knees (hx of adhesive capsulitis of shoulder)   Osteoarthritis of left ankle and foot    Prediabetes    A1c 6.1% 08/2019 at [facility]   Rectal leakage    UGI bleed 10/26/2021   duodenal ulcer-->transfused 2 U pRBCs   Past Surgical History:  Procedure Laterality Date   BIOPSY  10/27/2021   Procedure: BIOPSY;  Surgeon: Thornton Park, MD;  Location: [facility] ENDOSCOPY;  Service: Gastroenterology;;   COLONOSCOPY     2008 w/polyps,  09-22-2011 w/polyps.  01/2015 tubular adenoma x 1.  05/31/18 colonoscopy w/ two tubular adenomas.   COLONOSCOPY W/ POLYPECTOMY  2008; 09/22/11;01/2015   Tubular adenoma--recall 3 yrs per Dr. Louanne Belton at [facility].  Then switched to Dr. Hilarie Fredrickson with [facility].   ESOPHAGOGASTRODUODENOSCOPY  10/27/2021   duodenal ulcer, h pylori NEG   ESOPHAGOGASTRODUODENOSCOPY (EGD) WITH PROPOFOL N/A 10/27/2021   Procedure: ESOPHAGOGASTRODUODENOSCOPY (EGD) WITH PROPOFOL;  Surgeon: Thornton Park, MD;  Location: [facility];  Service: Gastroenterology;  Laterality: N/A;   POLYPECTOMY     REVERSE SHOULDER ARTHROPLASTY Left 10/17/2021   Procedure: LEFT REVERSE SHOULDER ARTHROPLASTY;  Surgeon: Meredith Pel, MD;  Location: [place];  Service: Orthopedics;  Laterality: Left;   TONSILLECTOMY     TRANSTHORACIC ECHOCARDIOGRAM  10/27/2021   10/2021: grd I DD, o/w normal   UMBILICAL HERNIA REPAIR  2011   WISDOM TOOTH EXTRACTION Bilateral    Family History  Problem Relation Age of Onset   Hypertension Father    Alcoholism Father    Stomach cancer Father        primary with mets to colon   Colon cancer Father    Dementia Mother    Asthma Brother    Diabetes Sister    Colon polyps Neg Hx    Esophageal cancer Neg Hx    Rectal cancer Neg Hx    Social History   Socioeconomic History   Marital status: Married    Spouse name: Mechele Claude   Number of  children: 2   Years of education: Not on file   Highest education level: Not on file  Occupational History   Not on file  Tobacco Use   Smoking status: Never   Smokeless tobacco: Never  Vaping Use   Vaping Use: Never used  Substance and Sexual Activity   Alcohol use: Yes    Alcohol/week: 1.0 standard drink of alcohol    Types: 1 Cans of beer per week    Comment: occassional   Drug use: No   Sexual activity: Yes  Other Topics Concern   Not on file  Social History Narrative   Married, 2 children, 3 grandchildren.  Occupation: retired Financial trader   Education: HS   No tob, minimal alcohol, no drugs.    Social Determinants of Health   Financial Resource Strain: Low Risk  (06/28/2022)   Overall Financial Resource Strain (CARDIA)    Difficulty of Paying Living Expenses: Not hard at all  Food Insecurity: No Food Insecurity (06/28/2022)   Hunger Vital Sign    Worried About Running Out of Food in the Last Year: Never true    Ran Out of Food in the Last Year: Never true  Transportation Needs: No Transportation Needs (06/28/2022)   PRAPARE - Hydrologist (Medical): No    Lack of Transportation (Non-Medical): No  Physical Activity: Insufficiently Active (06/28/2022)   Exercise Vital Sign    Days of Exercise per Week: 7 days    Minutes of Exercise per Session: 20 min  Stress: No Stress Concern Present (06/28/2022)   [place]    Feeling of Stress : Not at all  Social Connections: [place] (06/28/2022)   Social Connection and Isolation Panel [NHANES]    Frequency of Communication with Friends and Family: More than three times a week    Frequency of Social Gatherings with Friends and Family: More than three times a week    Attends Religious Services: More than 4 times per year    Active Member of [employer] or Organizations: Yes    Attends Archivist  Meetings: 1 to 4 times per year    Marital Status: Married    Tobacco Counseling Counseling given: Not Answered   Clinical Intake:  Pre-visit preparation completed: No  Pain : No/denies pain     Nutritional Risks: None Diabetes: No  How often do you need to have someone help you when you read instructions, pamphlets, or other written materials from your doctor or pharmacy?: 1 - Never What is the last grade level you completed in school?: 12th  Diabetic?no  Interpreter Needed?: No      Activities of Daily Living    06/28/2022   11:37 AM 10/27/2021    7:32 PM  In your present state of health, do you have any difficulty performing the following activities:  Hearing? 1 0  Vision? 0 0  Difficulty concentrating or making decisions? 0 1  Walking or climbing stairs? 0 0  Dressing or bathing? 0 0  Doing errands, shopping? 0 0  Preparing Food and eating ? N   Using the Toilet? N   In the past six months, have you accidently leaked urine? N   Do you have problems with loss of bowel control? N   Managing your Medications? N   Managing your Finances? N   Housekeeping or managing your Housekeeping? N     Patient Care Team: Tammi Sou, MD as PCP - General (Family Medicine) Pyrtle, Lajuan Lines, MD as Consulting Physician (Gastroenterology) [facility] Marlou Sa, Tonna Corner, MD as Consulting Physician (Orthopedic Surgery) Paulla Dolly Tamala Fothergill, DPM as Consulting Physician (Podiatry)  Indicate any recent Medical Services you may have received from other than [facility] providers in the past year (date may be approximate).     Assessment:   This is a routine wellness examination for Gregory Blevins.  Hearing/Vision screen No results found.  Dietary issues and exercise activities discussed: Current Exercise Habits: Home exercise routine, Type of exercise: stretching, Time (Minutes): 20, Frequency (Times/Week): 7, Weekly Exercise (Minutes/Week): 140   Goals Addressed   None  Depression Screen    06/28/2022   11:37 AM 04/13/2022    8:17 AM 06/22/2021   11:03 AM 06/09/2020    9:47 AM 04/15/2020    9:27 AM 04/11/2019    8:16 AM 04/09/2018    8:25 AM  PHQ 2/9 Scores  PHQ - 2 Score 0 0 0 0 0 0 0    Fall Risk    06/28/2022   11:37 AM 04/13/2022    8:18 AM 06/22/2021   11:05 AM 06/09/2020    9:45 AM 04/15/2020    9:27 AM  Fall Risk   Falls in the past year? 0 0 0 1 0  Number falls in past yr: 0 0 0 0 0  Injury with Fall? 0 0 0 0 0  Risk for fall due to :  Impaired vision Impaired vision History of fall(s)   Follow up Falls evaluation completed Falls evaluation completed Falls prevention discussed Falls prevention discussed Falls evaluation completed    FALL RISK PREVENTION PERTAINING TO THE HOME:  Any stairs in or around the home? No  If so, are there any without handrails? No  Home free of loose throw rugs in walkways, pet beds, electrical cords, etc? Yes  Adequate lighting in your home to reduce risk of falls? Yes   ASSISTIVE DEVICES UTILIZED TO PREVENT FALLS:  Life alert? No  Use of a cane, walker or w/c?  N/A Grab bars in the bathroom? Yes  Shower chair or bench in shower? No  Elevated toilet seat or a handicapped toilet? No   TIMED UP AND GO:  Was the test performed? No .  Length of time to ambulate 10 feet: n/a sec.     Cognitive Function:    04/11/2019    8:16 AM 04/09/2018    8:25 AM  MMSE - Mini Mental State Exam  Orientation to time 5 5  Orientation to Place 5 5  Registration 3 3  Attention/ Calculation 5 5  Recall 2 1  Language- name 2 objects 2 2  Language- repeat 1 1  Language- follow 3 step command 3 3  Language- read & follow direction 1 1  Write a sentence 1 1  Copy design 1 1  Total score 29 28        06/28/2022   11:42 AM 06/22/2021   11:07 AM 06/09/2020    9:52 AM  6CIT Screen  What Year? 0 points 0 points 0 points  What month? 0 points 0 points 0 points  What time? 0 points 0 points 0 points  Count  back from 20 0 points 0 points 0 points  Months in reverse 0 points 0 points 0 points  Repeat phrase 6 points 0 points 2 points  Total Score 6 points 0 points 2 points    Immunizations Immunization History  Administered Date(s) Administered   Fluad Quad(high Dose 65+) 04/11/2019, 04/15/2020, 04/15/2021, 04/13/2022   Influenza Split 07/14/2011, 05/13/2012   Influenza, High Dose Seasonal PF 04/16/2015, 03/22/2016, 03/21/2017, 04/09/2018   Influenza,inj,Quad PF,6+ Mos 03/21/2013, 04/10/2014   Influenza-Unspecified 05/13/1997, 05/11/2019, 04/26/2020   Moderna Sars-Covid-2 Vaccination 08/13/2019, 09/09/2019, 06/17/2020   Pneumococcal Conjugate-13 04/10/2014   Pneumococcal Polysaccharide-23 07/19/2009   Td 06/17/2009   Zoster, Live 07/05/2010    TDAP status: Due, Education has been provided regarding the importance of this vaccine. Advised may receive this vaccine at local pharmacy or Health Dept. Aware to provide a copy of the vaccination record if obtained from local pharmacy or Health Dept.  Verbalized acceptance and understanding.  Flu Vaccine status: Up to date  Pneumococcal vaccine status: Up to date  Covid-19 vaccine status: Completed vaccines  Qualifies for Shingles Vaccine? Yes   Zostavax completed No   Shingrix Completed?: No.    Education has been provided regarding the importance of this vaccine. Patient has been advised to call insurance company to determine out of pocket expense if they have not yet received this vaccine. Advised may also receive vaccine at local pharmacy or Health Dept. Verbalized acceptance and understanding.  Screening Tests Health Maintenance  Topic Date Due   Zoster Vaccines- Shingrix (1 of 2) Never done   DTaP/Tdap/Td (2 - Tdap) 06/18/2019   COVID-19 Vaccine (4 - 2023-24 season) 03/10/2022   Medicare Annual Wellness (AWV)  06/29/2023   Pneumonia Vaccine 43+ Years old  Completed   INFLUENZA VACCINE  Completed   HPV VACCINES  Aged Out    Health  Maintenance  Health Maintenance Due  Topic Date Due   Zoster Vaccines- Shingrix (1 of 2) Never done   DTaP/Tdap/Td (2 - Tdap) 06/18/2019   COVID-19 Vaccine (4 - 2023-24 season) 03/10/2022    Colorectal cancer screening: No longer required.   Lung Cancer Screening: (Low Dose CT Chest recommended if Age 76-80 years, 30 pack-year currently smoking OR have quit w/in 15years.) does not qualify.   Lung Cancer Screening Referral: n/a  Additional Screening:  Hepatitis C Screening: does not qualify; Completed n/a  Vision Screening: Recommended annual ophthalmology exams for early detection of glaucoma and other disorders of the eye. Is the patient up to date with their annual eye exam?  Yes  Who is the provider or what is the name of the office in which the patient attends annual eye exams? Dr. Mendel Ryder If pt is not established with a provider, would they like to be referred to a provider to establish care? No .   Dental Screening: Recommended annual dental exams for proper oral hygiene  Community Resource Referral / Chronic Care Management: CRR required this visit?  No   CCM required this visit?  No      Plan:     I have personally reviewed and noted the following in the patient's chart:   Medical and social history Use of alcohol, tobacco or illicit drugs  Current medications and supplements including opioid prescriptions. Patient is not currently taking opioid prescriptions. Functional ability and status Nutritional status Physical activity Advanced directives List of other physicians Hospitalizations, surgeries, and ER visits in previous 12 months Vitals Screenings to include cognitive, depression, and falls Referrals and appointments  In addition, I have reviewed and discussed with patient certain preventive protocols, quality metrics, and best practice recommendations. A written personalized care plan for preventive services as well as general preventive health  recommendations were provided to patient.     Beatrix Fetters, [place]   06/28/2022   Nurse Notes: Non-Face to Face or Face to Face 10 minute visit Encounter    Mr. Vallejo , Thank you for taking time to come for your Medicare Wellness Visit. I appreciate your ongoing commitment to your health goals. Please review the following plan we discussed and let me know if I can assist you in the future.   These are the goals we discussed:  Goals      Patient Stated     Would like to lose 10 pounds     Patient Stated     None at this time  This is a list of the screening recommended for you and due dates:  Health Maintenance  Topic Date Due   Zoster (Shingles) Vaccine (1 of 2) Never done   DTaP/Tdap/Td vaccine (2 - Tdap) 06/18/2019   COVID-19 Vaccine (4 - 2023-24 season) 03/10/2022   Medicare Annual Wellness Visit  06/29/2023   Pneumonia Vaccine  Completed   Flu Shot  Completed   HPV Vaccine  Aged Out

## 2022-07-12 DIAGNOSIS — G8918 Other acute postprocedural pain: Secondary | ICD-10-CM | POA: Diagnosis not present

## 2022-07-12 DIAGNOSIS — G5622 Lesion of ulnar nerve, left upper limb: Secondary | ICD-10-CM | POA: Diagnosis not present

## 2022-07-17 ENCOUNTER — Encounter: Payer: Self-pay | Admitting: Podiatry

## 2022-07-17 ENCOUNTER — Ambulatory Visit (INDEPENDENT_AMBULATORY_CARE_PROVIDER_SITE_OTHER): Payer: Medicare Other | Admitting: Podiatry

## 2022-07-17 VITALS — BP 159/75

## 2022-07-17 DIAGNOSIS — I739 Peripheral vascular disease, unspecified: Secondary | ICD-10-CM

## 2022-07-17 DIAGNOSIS — M79674 Pain in right toe(s): Secondary | ICD-10-CM

## 2022-07-17 DIAGNOSIS — M79675 Pain in left toe(s): Secondary | ICD-10-CM | POA: Diagnosis not present

## 2022-07-17 DIAGNOSIS — B351 Tinea unguium: Secondary | ICD-10-CM

## 2022-07-17 NOTE — Progress Notes (Signed)
  Subjective:  Patient ID: Gregory Blevins, male    DOB: 02-13-1941,  MRN: [id number]  Gregory Blevins presents to clinic today for at risk foot care. Patient has h/o PAD and painful elongated mycotic toenails 1-5 bilaterally which are tender when wearing enclosed shoe gear. Pain is relieved with periodic professional debridement.  Chief Complaint  Patient presents with   Nail Problem    [facility] PCP-[name] PCP VST- 6 months ago   New problem(s): None.   He is recovering from surgery of his left hand.  PCP is [name], [name], MD.  No Known Allergies  Review of Systems: Negative except as noted in the HPI.  Objective: No changes noted in today's physical examination. Vitals:   07/17/22 0926  BP: (!) 159/75   Gregory Blevins is a pleasant 82 y.o. male morbidly obese in NAD. AAO x 3.  Neurovascular Examination: CFT <4 seconds b/l LE. Faintly palpable DP pulses b/l LE. Nonpalpable PT pulse(s) b/l LE. Pedal hair absent. No pain with calf compression b/l. Trace edema noted BLE.  Protective sensation intact 5/5 intact bilaterally with 10g monofilament b/l.  Dermatological:  Pedal integument with normal turgor, texture and tone BLE. Toenails 1-5 b/l elongated, discolored, dystrophic, thickened, crumbly with subungual debris and tenderness to dorsal palpation. No hyperkeratotic nor porokeratotic lesions present on today's visit.  Musculoskeletal:  Muscle strength 5/5 to all lower extremity muscle groups bilaterally. No pain, crepitus or joint limitation noted with ROM b/l lower extremities. Limited joint ROM to the 1st MPJ b/l.  Assessment/Plan: 1. Pain due to onychomycosis of toenails of both feet   2. PAD (peripheral artery disease) ([place])     No orders of the defined types were placed in this encounter.  -Patient was evaluated and treated. All patient's and/or POA's questions/concerns answered on today's visit. -Continue supportive shoe gear daily. -Mycotic toenails 1-5  bilaterally were debrided in length and girth with sterile nail nippers and dremel. Pinpoint bleeding of left fourth digit addressed with Lumicain Hemostatic Solution, cleansed with alcohol. No further treatment required by patient/caregiver. -Patient/POA to call should there be question/concern in the interim.   Return in about 3 months (around 10/16/2022).  Marzetta Board, DPM

## 2022-07-28 DIAGNOSIS — D485 Neoplasm of uncertain behavior of skin: Secondary | ICD-10-CM | POA: Diagnosis not present

## 2022-07-28 DIAGNOSIS — Z08 Encounter for follow-up examination after completed treatment for malignant neoplasm: Secondary | ICD-10-CM | POA: Diagnosis not present

## 2022-07-28 DIAGNOSIS — L814 Other melanin hyperpigmentation: Secondary | ICD-10-CM | POA: Diagnosis not present

## 2022-07-28 DIAGNOSIS — L570 Actinic keratosis: Secondary | ICD-10-CM | POA: Diagnosis not present

## 2022-07-28 DIAGNOSIS — D229 Melanocytic nevi, unspecified: Secondary | ICD-10-CM | POA: Diagnosis not present

## 2022-07-28 DIAGNOSIS — R229 Localized swelling, mass and lump, unspecified: Secondary | ICD-10-CM | POA: Diagnosis not present

## 2022-07-28 DIAGNOSIS — Z85828 Personal history of other malignant neoplasm of skin: Secondary | ICD-10-CM | POA: Diagnosis not present

## 2022-07-28 DIAGNOSIS — L821 Other seborrheic keratosis: Secondary | ICD-10-CM | POA: Diagnosis not present

## 2022-07-28 DIAGNOSIS — C4442 Squamous cell carcinoma of skin of scalp and neck: Secondary | ICD-10-CM | POA: Diagnosis not present

## 2022-08-07 ENCOUNTER — Other Ambulatory Visit: Payer: Self-pay | Admitting: Surgical

## 2022-08-18 DIAGNOSIS — D044 Carcinoma in situ of skin of scalp and neck: Secondary | ICD-10-CM | POA: Diagnosis not present

## 2022-10-13 ENCOUNTER — Ambulatory Visit (INDEPENDENT_AMBULATORY_CARE_PROVIDER_SITE_OTHER): Payer: Medicare Other | Admitting: Family Medicine

## 2022-10-13 ENCOUNTER — Other Ambulatory Visit (INDEPENDENT_AMBULATORY_CARE_PROVIDER_SITE_OTHER): Payer: Medicare Other

## 2022-10-13 ENCOUNTER — Encounter: Payer: Self-pay | Admitting: Family Medicine

## 2022-10-13 VITALS — BP 139/80 | HR 66 | Wt 227.4 lb

## 2022-10-13 DIAGNOSIS — E7800 Pure hypercholesterolemia, unspecified: Secondary | ICD-10-CM

## 2022-10-13 DIAGNOSIS — R7303 Prediabetes: Secondary | ICD-10-CM

## 2022-10-13 DIAGNOSIS — I10 Essential (primary) hypertension: Secondary | ICD-10-CM

## 2022-10-13 DIAGNOSIS — R600 Localized edema: Secondary | ICD-10-CM | POA: Diagnosis not present

## 2022-10-13 LAB — LIPID PANEL
Cholesterol: 120 mg/dL (ref 0–200)
HDL: 34.5 mg/dL — ABNORMAL LOW (ref >39.00)
NonHDL: 85.92
Total CHOL/HDL Ratio: 3
Triglycerides: 203 mg/dL — ABNORMAL HIGH (ref 0.0–149.0)
VLDL: 40.6 mg/dL — ABNORMAL HIGH (ref 0.0–40.0)

## 2022-10-13 LAB — COMPREHENSIVE METABOLIC PANEL
ALT: 21 U/L (ref 0–53)
AST: 19 U/L (ref 0–37)
Albumin: 4.2 g/dL (ref 3.5–5.2)
Alkaline Phosphatase: 96 U/L (ref 39–117)
BUN: 30 mg/dL — ABNORMAL HIGH (ref 6–23)
CO2: 27 mEq/L (ref 19–32)
Calcium: 8.9 mg/dL (ref 8.4–10.5)
Chloride: 104 mEq/L (ref 96–112)
Creatinine, Ser: 1.17 mg/dL (ref 0.40–1.50)
GFR: 58.47 mL/min — ABNORMAL LOW (ref >60.00)
Glucose, Bld: 71 mg/dL (ref 70–99)
Potassium: 4.2 mEq/L (ref 3.5–5.1)
Sodium: 139 mEq/L (ref 135–145)
Total Bilirubin: 0.4 mg/dL (ref 0.2–1.2)
Total Protein: 6.6 g/dL (ref 6.0–8.3)

## 2022-10-13 LAB — POCT GLYCOSYLATED HEMOGLOBIN (HGB A1C)
HbA1c POC (<> result, manual entry): 5.4 % (ref 4.0–5.6)
HbA1c, POC (controlled diabetic range): 5.4 % (ref 0.0–7.0)
HbA1c, POC (prediabetic range): 5.4 % — AB (ref 5.7–6.4)
Hemoglobin A1C: 5.4 % (ref 4.0–5.6)

## 2022-10-13 LAB — LDL CHOLESTEROL, DIRECT: Direct LDL: 63 mg/dL

## 2022-10-13 MED ORDER — MECLIZINE HCL 25 MG PO TABS: 25.0000 mg | ORAL_TABLET | Freq: Three times a day (TID) | ORAL | 1 refills | Status: AC | PRN

## 2022-10-13 NOTE — Addendum Note (Signed)
Addended by: Emi Holes D on: 10/13/2022 09:02 AM   Modules accepted: Orders

## 2022-10-13 NOTE — Progress Notes (Signed)
OFFICE VISIT  10/13/2022  CC:  Chief Complaint  Patient presents with   Follow-up    6 month follow up. No other questions or concerns.    Patient is a 82 y.o. male who presents for 8670-month follow-up hypercholesterolemia, hypertension, prediabetes, and bilateral lower extremity edema. 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 Ordered today.   2. Left calf pain->venous doppler r/o dVt ordered.   #3 hypertension, well controlled on lisinopril 30 mg a day. Electrolytes and creatinine today.   4.  Hypercholesterolemia.  Doing well on atorvastatin 20 mg a day. Nonfasting lipid panel today.   #5 bilateral lower extremity edema. Stable on furosemide 20 mg every other day. Limit sodium, elevate.   #6 prediabetes. Hemoglobin A1c excellent at 5.1% today. Repeat A1c in 1 year."  INTERIM HX: He says he is doing well.  He takes one half of a 30 mg lisinopril tab daily.  Home blood pressures typically in the 130s over 80s but sometimes 120s.  He takes his Lasix tab very infrequently now because he says it makes him urinate so much.  Lower legs feel fine.  After his left shoulder surgery he got left cubital tunnel compression syndrome and ended up getting decompression surgery.  He is getting some strength back in his hand.    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   History of basal cell carcinoma of skin    HTN (hypertension)    Hyperlipidemia     Internal hemorrhoids    Lumbar spondylosis 04/2013   with grade I spondylolisthesis L4 on L5.   Microhematuria 09/2017   On dipstick only.  Urine micro 09/2017 and 04/2018-->no RBCs.   OAB (overactive bladder)    ditropan xl 10 ineffective--pt d/c'd this 09/2018   Obesity    Osteoarthritis    Hips (L>R on x-ray 04/2013), shoulders, and knees (hx of adhesive capsulitis of shoulder)   Osteoarthritis of left ankle and foot    Prediabetes    A1c 6.[facility]% 08[facility]   Rectal leakage    UGI bleed 10/26/2021   duodenal ulcer-->transfused 2 U pRBCs    Past Surgical History:  Procedure Laterality Date   BIOPSY  10/27/2021   Procedure: BIOPSY;  Surgeon: Tressia DanasBeavers, Kimberly, MD;  [facility];  Service: Gastroenterology;;   COLONOSCOPY     2008 w/polyps,  09-22-2011 w/polyps.  01/2015 tubular adenoma x 1.  05/31/18 colonoscopy w/ two tubular adenomas.   COLONOSCOPY W/ POLYPECTOMY  2008; 09/22/11;01/2015   Tubular adenoma--recall 3 yrs per Dr. Tobin ChadNocolas E Robinson [facility].  Then switched to Dr. Rhea BeltonPyrtle with [place].   ESOPHAGOGASTRODUODENOSCOPY  10/27/2021   duodenal ulcer, h pylori NEG   ESOPHAGOGASTRODUODENOSCOPY (EGD) WITH PROPOFOL N/A 10/27/2021   Procedure: ESOPHAGOGASTRODUODENOSCOPY (EGD) WITH PROPOFOL;  Surgeon: Tressia DanasBeavers, Kimberly, MD;  [facility];  Service: Gastroenterology;  Laterality: N/A;   POLYPECTOMY  REVERSE SHOULDER ARTHROPLASTY Left 10/17/2021   Procedure: LEFT REVERSE SHOULDER ARTHROPLASTY;  Surgeon: Cammy Copa, MD;  Location: [facility] OR;  Service: Orthopedics;  Laterality: Left;   TONSILLECTOMY     TRANSTHORACIC ECHOCARDIOGRAM  10/27/2021   10/2021: grd I DD, o/w normal   UMBILICAL HERNIA REPAIR  2011   WISDOM TOOTH EXTRACTION Bilateral     Outpatient Medications Prior to Visit  Medication Sig Dispense Refill   acetaminophen (TYLENOL) 500 MG tablet Take 500-1,000 mg by mouth in the morning and at bedtime.     atorvastatin (LIPITOR) 20 MG  tablet Take 1 tablet (20 mg total) by mouth daily. 90 tablet 3   cholecalciferol (VITAMIN D) 25 MCG (1000 UNIT) tablet Take 1,000 Units by mouth daily.     diclofenac Sodium (VOLTAREN) 1 % GEL Apply 2 g topically 4 (four) times daily as needed. 100 g 0   furosemide (LASIX) 20 MG tablet 1 tab po every other day 45 tablet 3   lisinopril (ZESTRIL) 30 MG tablet TAKE 1 TABLET BY MOUTH EVERY DAY 90 tablet 3   pantoprazole (PROTONIX) 40 MG tablet Take 1 tablet (40 mg total) by mouth 2 (two) times daily before a meal. 60 tablet 3   senna (SENOKOT) 8.6 MG TABS tablet Take 1 tablet by mouth daily as needed for mild constipation or moderate constipation.     meclizine (ANTIVERT) 25 MG tablet Take 1-2 tablets (25-50 mg total) by mouth 3 (three) times daily as needed for dizziness. 30 tablet 1   No facility-administered medications prior to visit.    No Known Allergies  Review of Systems As per HPI  PE:    10/13/2022    8:31 AM 07/17/2022    9:26 AM 04/20/2022    4:06 AM  Vitals with BMI  Height   5\' 7"   Weight 227 lbs 6 oz  225 lbs  BMI   35.23  Systolic 139 159 245  Diastolic 80 75 72  Pulse 66  80     Physical Exam  Gen: Alert, well appearing.  Patient is oriented to person, place, time, and situation. AFFECT: pleasant, lucid thought and speech. Bilat LL pitting edema 1-2+, not tense, no erythema or warmth.  LABS:  Last CBC Lab Results  Component Value Date   WBC 10.4 04/13/2022   HGB 14.1 04/13/2022   HCT 42.8 04/13/2022   MCV 88.5 04/13/2022   MCH 28.9 04/09/2022   RDW 14.8 04/13/2022   PLT 183.0 04/13/2022   Last metabolic panel Lab Results  Component Value Date   GLUCOSE 94 04/13/2022   NA 139 04/13/2022   K 5.0 04/13/2022   CL 104 04/13/2022   CO2 27 04/13/2022   BUN 26 (H) 04/13/2022   CREATININE 1.21 04/13/2022   GFRNONAA 58 (L) 04/09/2022   CALCIUM 9.4 04/13/2022   PHOS 2.9 10/27/2021   PROT 7.5 04/09/2022   ALBUMIN 4.0 04/09/2022   BILITOT 0.8 04/09/2022    ALKPHOS 94 04/09/2022   AST 23 04/09/2022   ALT 23 04/09/2022   ANIONGAP 7 04/09/2022   Last lipids Lab Results  Component Value Date   CHOL 124 04/13/2022   HDL 40.80 04/13/2022   LDLCALC 57 04/13/2022   LDLDIRECT 76.0 03/22/2016   TRIG 127.0 04/13/2022   CHOLHDL 3 04/13/2022   Last hemoglobin A1c Lab Results  Component Value Date   HGBA1C 5.4 10/13/2022   HGBA1C 5.4 10/13/2022   HGBA1C 5.4 (A) 10/13/2022   HGBA1C 5.4 10/13/2022  Last thyroid functions Lab Results  Component Value Date   TSH 1.23 08/13/2019   Last vitamin D Lab Results  Component Value Date   VD25OH 61.15 08/13/2019     IMPRESSION AND PLAN:  #1 hypertension control not quite at goal of 130/80 or better.  Increase lisinopril 30 mg to a whole tab daily.  He had been on this dose at 1 point in time.  2.  Hypercholesterolemia, doing well on Lipitor 20 mg a day. Lipid panel and hepatic panel today.  #3 prediabetes.  Doing well with working on therapeutic lifestyle changes. POC Hba1c today is 5.4%.  #4 bilateral lower extremity edema: Very stable on as needed use of small dose of Lasix.  Continue low-sodium diet and compression stockings as needed.  An After Visit Summary was printed and given to the patient.  FOLLOW UP: Return in about 6 months (around 04/14/2023) for routine chronic illness f/u.  Signed:  Santiago BumpersPhil Daire Okimoto, MD           10/13/2022

## 2022-10-24 ENCOUNTER — Ambulatory Visit (INDEPENDENT_AMBULATORY_CARE_PROVIDER_SITE_OTHER): Payer: Medicare Other | Admitting: Podiatry

## 2022-10-24 ENCOUNTER — Encounter: Payer: Self-pay | Admitting: Podiatry

## 2022-10-24 DIAGNOSIS — M79674 Pain in right toe(s): Secondary | ICD-10-CM | POA: Diagnosis not present

## 2022-10-24 DIAGNOSIS — M79675 Pain in left toe(s): Secondary | ICD-10-CM | POA: Diagnosis not present

## 2022-10-24 DIAGNOSIS — I739 Peripheral vascular disease, unspecified: Secondary | ICD-10-CM | POA: Diagnosis not present

## 2022-10-24 DIAGNOSIS — B351 Tinea unguium: Secondary | ICD-10-CM

## 2022-10-24 NOTE — Progress Notes (Signed)
  Subjective:  Patient ID: Gregory Blevins, male    DOB: Feb 10, 1941,  MRN: [id number]  Gregory Blevins presents to clinic today for at risk foot care. Patient has h/o PAD  Chief Complaint  Patient presents with   Nail Problem    RFC PCP-[name] PCP VST-Last week   New problem(s): None.   PCP is [name], Maryjean Morn, MD.  No Known Allergies  Review of Systems: Negative except as noted in the HPI.  Objective: No changes noted in today's physical examination. There were no vitals filed for this visit. Gregory Blevins is a pleasant 82 y.o. male in NAD. AAO x 3.  Neurovascular Examination: CFT <4 seconds b/l LE. Faintly palpable DP pulses b/l LE. Nonpalpable PT pulse(s) b/l LE. Pedal hair absent. No pain with calf compression b/l. Trace edema noted BLE.  Protective sensation intact 5/5 intact bilaterally with 10g monofilament b/l.  Dermatological:  Pedal integument with normal turgor, texture and tone BLE. Toenails 1-5 b/l elongated, discolored, dystrophic, thickened, crumbly with subungual debris and tenderness to dorsal palpation. No hyperkeratotic nor porokeratotic lesions present on today's visit.  Musculoskeletal:  Muscle strength 5/5 to all lower extremity muscle groups bilaterally. No pain, crepitus or joint limitation noted with ROM b/l lower extremities. Limited joint ROM to the 1st MPJ b/l.  Assessment/Plan: 1. Pain due to onychomycosis of toenails of both feet   2. PAD (peripheral artery disease)    -Consent given for treatment as described below: -Examined patient. -Patient to continue soft, supportive shoe gear daily. -Toenails 1-5 b/l were debrided in length and girth with sterile nail nippers and dremel without iatrogenic bleeding.  -Patient/POA to call should there be question/concern in the interim.   Return in about 3 months (around 01/23/2023).  Freddie Breech, DPM

## 2022-11-02 ENCOUNTER — Other Ambulatory Visit: Payer: Self-pay | Admitting: Surgical

## 2022-12-07 DIAGNOSIS — G5622 Lesion of ulnar nerve, left upper limb: Secondary | ICD-10-CM | POA: Diagnosis not present

## 2022-12-07 DIAGNOSIS — M79642 Pain in left hand: Secondary | ICD-10-CM | POA: Diagnosis not present

## 2022-12-27 ENCOUNTER — Ambulatory Visit (INDEPENDENT_AMBULATORY_CARE_PROVIDER_SITE_OTHER): Payer: Medicare Other | Admitting: Family Medicine

## 2022-12-27 ENCOUNTER — Encounter: Payer: Self-pay | Admitting: Family Medicine

## 2022-12-27 VITALS — BP 118/70 | HR 72 | Wt 225.6 lb

## 2022-12-27 DIAGNOSIS — L720 Epidermal cyst: Secondary | ICD-10-CM

## 2022-12-27 NOTE — Patient Instructions (Signed)
Apply heat (wet/hot towel OR a heating pad) for 20 minutes TWICE per day to encourage drainage.

## 2022-12-27 NOTE — Progress Notes (Signed)
OFFICE VISIT  12/27/2022  CC:  Chief Complaint  Patient presents with   Acute Visit    Knot on his chest that feels like fluid is in it.    Patient is a 82 y.o. male who presents for "lump on chest".  HPI: In the last day or 2 he has noticed a small swelling under the skin in the sternum area.  Does not hurt.  The center part drained a tiny amount of milky fluid.  He has felt well.  Past Medical History:  Diagnosis Date   Diverticulosis 2013   noted on colonoscopy   Duodenal ulcer    Esophagitis    Fatty liver 05/2012   u/s done for mild/persistent elevation of LFTs   GI bleed    Hearing impairment    11/2015 Audiology eval= not yet ready for hearing aids ([facility])   History of adenomatous polyp of colon    On multiple colonoscopies: most recent colonoscopy 05/31/18-->adenoma x 2, repeat 3-5 ys is optional.   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   Microhematuria 09/2017   On dipstick only.  Urine micro 09/2017 and 04/2018-->no RBCs.   OAB (overactive bladder)    ditropan xl 10 ineffective--pt d/c'd this 09/2018   Obesity    Osteoarthritis    Hips (L>R on x-ray 04/2013), shoulders, and knees (hx of adhesive capsulitis of shoulder)   Osteoarthritis of left ankle and foot    Prediabetes    A1c 6.1% 08/2019 at [facility]   Rectal leakage    UGI bleed 10/26/2021   duodenal ulcer-->transfused 2 U pRBCs    Past Surgical History:  Procedure Laterality Date   BIOPSY  10/27/2021   Procedure: BIOPSY;  Surgeon: Tressia Danas, MD;  Location: [facility] ENDOSCOPY;  Service: Gastroenterology;;   COLONOSCOPY     2008 w/polyps,  09-22-2011 w/polyps.  01/2015 tubular adenoma x 1.  05/31/18 colonoscopy w/ two tubular adenomas.   COLONOSCOPY W/ POLYPECTOMY  2008; 09/22/11;01/2015   Tubular adenoma--recall 3 yrs per Dr. Tobin Chad at [facility].  Then switched to Dr. Rhea Belton with [place].   ELBOW SURGERY     Left ulnar decompression 2024   ESOPHAGOGASTRODUODENOSCOPY  10/27/2021   duodenal ulcer, h pylori NEG   ESOPHAGOGASTRODUODENOSCOPY (EGD) WITH PROPOFOL N/A 10/27/2021   Procedure: ESOPHAGOGASTRODUODENOSCOPY (EGD) WITH PROPOFOL;  Surgeon: Tressia Danas, MD;  [facility] END[facility]COPY;  Service: Gastroenterology;  Laterality: N/A;   POLYPECTOMY     REVERSE SHOULDER ARTHROPLASTY Left 10/17/2021   Procedure: LEFT REVERSE SHOULDER ARTHROPLASTY;  Surgeon: Cammy Copa, MD;  Location: [facility] OR;  Service: Orthopedics;  Laterality: Left;   TONSILLECTOMY     TRANSTHORACIC ECHOCARDIOGRAM  10/27/2021   10/2021: grd I DD, o/w normal   UMBILICAL HERNIA REPAIR  2011   WISDOM TOOTH EXTRACTION Bilateral     Outpatient Medications Prior to Visit  Medication Sig Dispense Refill   acetaminophen (TYLENOL) 500 MG tablet Take 500-1,000 mg by mouth in the morning and at bedtime.     atorvastatin (LIPITOR) 20 MG tablet Take 1 tablet (20 mg total) by mouth daily. 90 tablet 3   cholecalciferol (VITAMIN D) 25 MCG (1000 UNIT) tablet Take 1,000 Units by mouth daily.     diclofenac Sodium (VOLTAREN) 1 % GEL Apply 2 g topically 4 (four)  times daily as needed. 100 g 0   furosemide (LASIX) 20 MG tablet 1 tab po every other day 45 tablet 3   gabapentin (NEURONTIN) 100 MG capsule TAKE 1 CAPSULE (100 MG TOTAL) BY MOUTH THREE TIMES DAILY. 90 capsule 0   lisinopril (ZESTRIL) 30 MG tablet TAKE 1 TABLET BY MOUTH EVERY DAY 90 tablet 3   meclizine (ANTIVERT) 25 MG tablet Take 1-2 tablets (25-50 mg total) by mouth 3 (three) times daily as needed for dizziness. 30 tablet 1   pantoprazole (PROTONIX) 40 MG tablet Take 1 tablet (40 mg total) by mouth 2 (two) times daily before a meal. (Patient not taking: Reported on 12/27/2022) 60 tablet 3   senna (SENOKOT) 8.6 MG TABS tablet Take 1 tablet by mouth daily as needed for mild constipation or moderate  constipation. (Patient not taking: Reported on 12/27/2022)     No facility-administered medications prior to visit.    No Known Allergies  Review of Systems  As per HPI  PE:    12/27/2022    2:41 PM 10/13/2022    8:31 AM 07/17/2022    9:26 AM  Vitals with BMI  Weight 225 lbs 10 oz 227 lbs 6 oz   Systolic 118 139 469  Diastolic 70 80 75  Pulse 72 66      Physical Exam  Gen: Alert, well appearing.  Patient is oriented to person, place, time, and situation. Sternal region of chest with 2 cm diameter subcutaneous cystic lesion with punctate drainage point.  Nontender.  Color is pink.  LABS:  Last CBC Lab Results  Component Value Date   WBC 10.4 04/13/2022   HGB 14.1 04/13/2022   HCT 42.8 04/13/2022   MCV 88.5 04/13/2022   MCH 28.9 04/09/2022   RDW 14.8 04/13/2022   PLT 183.0 04/13/2022   Last metabolic panel Lab Results  Component Value Date   GLUCOSE 71 10/13/2022   NA 139 10/13/2022   K 4.2 10/13/2022   CL 104 10/13/2022   CO2 27 10/13/2022   BUN 30 (H) 10/13/2022   CREATININE 1.17 10/13/2022   GFRNONAA 58 (L) 04/09/2022   CALCIUM 8.9 10/13/2022   PHOS 2.9 10/27/2021   PROT 6.6 10/13/2022   ALBUMIN 4.2 10/13/2022   BILITOT 0.4 10/13/2022   ALKPHOS 96 10/13/2022   AST 19 10/13/2022   ALT 21 10/13/2022   ANIONGAP 7 04/09/2022   Last hemoglobin A1c Lab Results  Component Value Date   HGBA1C 5.4 10/13/2022   HGBA1C 5.4 10/13/2022   HGBA1C 5.4 (A) 10/13/2022   HGBA1C 5.4 10/13/2022   IMPRESSION AND PLAN:  Epidermal inclusion cyst. Warm compresses twice a day for 20 minutes recommended. Signs/symptoms to call or return for were reviewed and pt expressed understanding.  An After Visit Summary was printed and given to the patient.  FOLLOW UP: Return if symptoms worsen or fail to improve.  Signed:  Santiago Bumpers, MD           12/27/2022

## 2023-02-06 DIAGNOSIS — G5622 Lesion of ulnar nerve, left upper limb: Secondary | ICD-10-CM | POA: Diagnosis not present

## 2023-02-06 DIAGNOSIS — M79642 Pain in left hand: Secondary | ICD-10-CM | POA: Diagnosis not present

## 2023-02-13 DIAGNOSIS — Z85828 Personal history of other malignant neoplasm of skin: Secondary | ICD-10-CM | POA: Diagnosis not present

## 2023-02-13 DIAGNOSIS — C4442 Squamous cell carcinoma of skin of scalp and neck: Secondary | ICD-10-CM | POA: Diagnosis not present

## 2023-02-13 DIAGNOSIS — R229 Localized swelling, mass and lump, unspecified: Secondary | ICD-10-CM | POA: Diagnosis not present

## 2023-02-13 DIAGNOSIS — D229 Melanocytic nevi, unspecified: Secondary | ICD-10-CM | POA: Diagnosis not present

## 2023-02-13 DIAGNOSIS — L570 Actinic keratosis: Secondary | ICD-10-CM | POA: Diagnosis not present

## 2023-02-13 DIAGNOSIS — Z08 Encounter for follow-up examination after completed treatment for malignant neoplasm: Secondary | ICD-10-CM | POA: Diagnosis not present

## 2023-03-06 ENCOUNTER — Ambulatory Visit (INDEPENDENT_AMBULATORY_CARE_PROVIDER_SITE_OTHER): Payer: Medicare Other | Admitting: Podiatry

## 2023-03-06 ENCOUNTER — Encounter: Payer: Self-pay | Admitting: Podiatry

## 2023-03-06 VITALS — BP 130/68 | HR 86

## 2023-03-06 DIAGNOSIS — M79675 Pain in left toe(s): Secondary | ICD-10-CM

## 2023-03-06 DIAGNOSIS — B351 Tinea unguium: Secondary | ICD-10-CM | POA: Diagnosis not present

## 2023-03-06 DIAGNOSIS — I739 Peripheral vascular disease, unspecified: Secondary | ICD-10-CM

## 2023-03-06 DIAGNOSIS — M79674 Pain in right toe(s): Secondary | ICD-10-CM | POA: Diagnosis not present

## 2023-03-06 NOTE — Progress Notes (Signed)
  Subjective:  Patient ID: Gregory Blevins, male    DOB: [birth year],  MRN: [id number]  Gregory Blevins presents to clinic today for at risk foot care. Patient has h/o PAD and painful elongated mycotic toenails 1-5 bilaterally which are tender when wearing enclosed shoe gear. Pain is relieved with periodic professional debridement.  Chief Complaint  Patient presents with   Nail Problem   New problem(s): None.   PCP is [name], Maryjean Morn, MD.  No Known Allergies  Review of Systems: Negative except as noted in the HPI.  Objective: No changes noted in today's physical examination. There were no vitals filed for this visit. Gregory Blevins is a pleasant 82 y.o. male obese in NAD. AAO x 3.  Neurovascular Examination: CFT <4 seconds b/l LE. Faintly palpable DP pulses b/l LE. Nonpalpable PT pulse(s) b/l LE. Pedal hair absent. No pain with calf compression b/l. Trace edema noted BLE.  Protective sensation intact 5/5 intact bilaterally with 10g monofilament b/l.  Dermatological:  Pedal integument with normal turgor, texture and tone BLE. Toenails 1-5 b/l elongated, discolored, dystrophic, thickened, crumbly with subungual debris and tenderness to dorsal palpation. No hyperkeratotic nor porokeratotic lesions present on today's visit.  Musculoskeletal:  Muscle strength 5/5 to all lower extremity muscle groups bilaterally. No pain, crepitus or joint limitation noted with ROM b/l lower extremities. Limited joint ROM to the 1st MPJ b/l.  Assessment/Plan: 1. Pain due to onychomycosis of toenails of both feet   2. PAD (peripheral artery disease) (HCC)     Patient was evaluated and treated. All patient's and/or POA's questions/concerns addressed on today's visit. Mycotic toenails 1-5 debrided in length and girth without incident. Continue soft, supportive shoe gear daily. Report any pedal injuries to medical professional. Call office if there are any quesitons/concerns. -Patient/POA to call  should there be question/concern in the interim.   Return in about 3 months (around 06/06/2023).  Freddie Breech, DPM

## 2023-03-10 ENCOUNTER — Encounter: Payer: Self-pay | Admitting: Podiatry

## 2023-04-16 ENCOUNTER — Ambulatory Visit (INDEPENDENT_AMBULATORY_CARE_PROVIDER_SITE_OTHER): Payer: Medicare Other | Admitting: Family Medicine

## 2023-04-16 ENCOUNTER — Encounter: Payer: Self-pay | Admitting: Family Medicine

## 2023-04-16 VITALS — BP 123/56 | HR 58 | Wt 223.0 lb

## 2023-04-16 DIAGNOSIS — I10 Essential (primary) hypertension: Secondary | ICD-10-CM

## 2023-04-16 DIAGNOSIS — Z23 Encounter for immunization: Secondary | ICD-10-CM | POA: Diagnosis not present

## 2023-04-16 DIAGNOSIS — R600 Localized edema: Secondary | ICD-10-CM

## 2023-04-16 DIAGNOSIS — R7303 Prediabetes: Secondary | ICD-10-CM | POA: Diagnosis not present

## 2023-04-16 DIAGNOSIS — E7800 Pure hypercholesterolemia, unspecified: Secondary | ICD-10-CM | POA: Diagnosis not present

## 2023-04-16 LAB — HEMOGLOBIN A1C: Hgb A1c MFr Bld: 5.8 % (ref 4.6–6.5)

## 2023-04-16 LAB — COMPREHENSIVE METABOLIC PANEL
ALT: 26 U/L (ref 0–53)
AST: 21 U/L (ref 0–37)
Albumin: 4.2 g/dL (ref 3.5–5.2)
Alkaline Phosphatase: 96 U/L (ref 39–117)
BUN: 26 mg/dL — ABNORMAL HIGH (ref 6–23)
CO2: 27 meq/L (ref 19–32)
Calcium: 9.3 mg/dL (ref 8.4–10.5)
Chloride: 103 meq/L (ref 96–112)
Creatinine, Ser: 1.29 mg/dL (ref 0.40–1.50)
GFR: 51.83 mL/min — ABNORMAL LOW (ref >60.00)
Glucose, Bld: 88 mg/dL (ref 70–99)
Potassium: 5.3 meq/L — ABNORMAL HIGH (ref 3.5–5.1)
Sodium: 137 meq/L (ref 135–145)
Total Bilirubin: 0.7 mg/dL (ref 0.2–1.2)
Total Protein: 6.9 g/dL (ref 6.0–8.3)

## 2023-04-16 LAB — LIPID PANEL
Cholesterol: 133 mg/dL (ref 0–200)
HDL: 45.6 mg/dL (ref >39.00)
LDL Cholesterol: 63 mg/dL (ref 0–99)
NonHDL: 87.09
Total CHOL/HDL Ratio: 3
Triglycerides: 118 mg/dL (ref 0.0–149.0)
VLDL: 23.6 mg/dL (ref 0.0–40.0)

## 2023-04-16 MED ORDER — LISINOPRIL 30 MG PO TABS: ORAL_TABLET | ORAL | 1 refills | Status: DC

## 2023-04-16 MED ORDER — ATORVASTATIN CALCIUM 20 MG PO TABS: 20.0000 mg | ORAL_TABLET | Freq: Every day | ORAL | 1 refills | Status: DC

## 2023-04-16 NOTE — Progress Notes (Signed)
OFFICE VISIT  04/16/2023  CC:  Chief Complaint  Patient presents with   Medical Management of Chronic Issues    Patient is a 82 y.o. male who presents for 19-month follow-up hypercholesterolemia, hypertension, prediabetes, and bilateral lower extremity edema. A/P as of last visit: "#1 hypertension control not quite at goal of 130/80 or better.  Increase lisinopril 30 mg to a whole tab daily.  He had been on this dose at 1 point in time.   2.  Hypercholesterolemia, doing well on Lipitor 20 mg a day. Lipid panel and hepatic panel today.   #3 prediabetes.  Doing well with working on therapeutic lifestyle changes. POC Hba1c today is 5.4%.   #4 bilateral lower extremity edema: Very stable on as needed use of small dose of Lasix.  Continue low-sodium diet and compression stockings as needed."  INTERIM HX: Gregory Blevins feels well. He is active in his yard.  He has no musculoskeletal pain at this time.  No change in lower extremity edema, pretty minimal.  He is not using any Lasix with any regularity.  ROS as above, plus--> no fevers, no CP, no SOB, no wheezing, no cough, no dizziness, no HAs, no rashes, no melena/hematochezia.  No polyuria or polydipsia.  No myalgias or arthralgias.  No focal weakness, paresthesias, or tremors.  No acute vision or hearing abnormalities.  No dysuria or unusual/new urinary urgency or frequency.  No recent changes in lower legs. No n/v/d or abd pain.  No palpitations.    Past Medical History:  Diagnosis Date   Diverticulosis 2013   noted on colonoscopy   Duodenal ulcer    Esophagitis    Fatty liver 05/2012   u/s done for mild/persistent elevation of LFTs   GI bleed    Hearing impairment    11/2015 Audiology eval= not yet ready for hearing aids ([facility])   History of adenomatous polyp of colon    On multiple colonoscopies: most recent colonoscopy 05/31/18-->adenoma x 2, repeat 3-5 ys is optional.   History of basal cell carcinoma of skin     HTN (hypertension)    Hyperlipidemia    Internal hemorrhoids    Lumbar spondylosis 04/2013   with grade I spondylolisthesis L4 on L5.   Microhematuria 09/2017   On dipstick only.  Urine micro 09/2017 and 04/2018-->no RBCs.   OAB (overactive bladder)    ditropan xl 10 ineffective--pt d/c'd this 09/2018   Obesity    Osteoarthritis    Hips (L>R on x-ray 04/2013), shoulders, and knees (hx of adhesive capsulitis of shoulder)   Osteoarthritis of left ankle and foot    Prediabetes    A1c 6.1% 08/2019 at [facility]   Rectal leakage    UGI bleed 10/26/2021   duodenal ulcer-->transfused 2 U pRBCs    Past Surgical History:  Procedure Laterality Date   BIOPSY  10/27/2021   Procedure: BIOPSY;  Surgeon: Tressia Danas, MD;  Location: [facility] ENDOSCOPY;  Service: Gastroenterology;;   COLONOSCOPY     2008 w/polyps,  09-22-2011 w/polyps.  01/2015 tubular adenoma x 1.  05/31/18 colonoscopy w/ two tubular adenomas.   COLONOSCOPY W/ POLYPECTOMY  2008; 09/22/11;01/2015   Tubular adenoma--recall 3 yrs per Dr. Tobin Chad at [facility].  Then switched to Dr. Rhea Belton with [place].   ELBOW SURGERY     Left ulnar decompression 2024   ESOPHAGOGASTRODUODENOSCOPY  10/27/2021   duodenal ulcer, h pylori NEG   ESOPHAGOGASTRODUODENOSCOPY (EGD) WITH PROPOFOL N/A 10/27/2021   Procedure: ESOPHAGOGASTRODUODENOSCOPY (  EGD) WITH PROPOFOL;  Surgeon: Tressia Danas, MD;  Location: [facility] ENDOSCOPY;  Service: Gastroenterology;  Laterality: N/A;   POLYPECTOMY     REVERSE SHOULDER ARTHROPLASTY Left 10/17/2021   Procedure: LEFT REVERSE SHOULDER ARTHROPLASTY;  Surgeon: Cammy Copa, MD;  Location: [facility] OR;  Service: Orthopedics;  Laterality: Left;   TONSILLECTOMY     TRANSTHORACIC ECHOCARDIOGRAM  10/27/2021   10/2021: grd I DD, o/w normal   UMBILICAL HERNIA REPAIR  2011   WISDOM TOOTH EXTRACTION Bilateral     Outpatient Medications Prior to Visit  Medication Sig Dispense Refill   acetaminophen (TYLENOL) 500 MG  tablet Take 500-1,000 mg by mouth in the morning and at bedtime.     atorvastatin (LIPITOR) 20 MG tablet Take 1 tablet (20 mg total) by mouth daily. 90 tablet 3   cholecalciferol (VITAMIN D) 25 MCG (1000 UNIT) tablet Take 1,000 Units by mouth daily.     diclofenac Sodium (VOLTAREN) 1 % GEL Apply 2 g topically 4 (four) times daily as needed. 100 g 0   furosemide (LASIX) 20 MG tablet 1 tab po every other day 45 tablet 3   lisinopril (ZESTRIL) 30 MG tablet TAKE 1 TABLET BY MOUTH EVERY DAY 90 tablet 3   meclizine (ANTIVERT) 25 MG tablet Take 1-2 tablets (25-50 mg total) by mouth 3 (three) times daily as needed for dizziness. 30 tablet 1   gabapentin (NEURONTIN) 100 MG capsule TAKE 1 CAPSULE (100 MG TOTAL) BY MOUTH THREE TIMES DAILY. (Patient not taking: Reported on 04/16/2023) 90 capsule 0   pantoprazole (PROTONIX) 40 MG tablet Take 1 tablet (40 mg total) by mouth 2 (two) times daily before a meal. (Patient not taking: Reported on 04/16/2023) 60 tablet 3   senna (SENOKOT) 8.6 MG TABS tablet Take 1 tablet by mouth daily as needed for mild constipation or moderate constipation. (Patient not taking: Reported on 04/16/2023)     No facility-administered medications prior to visit.    No Known Allergies  Review of Systems As per HPI  PE:    04/16/2023    8:22 AM 04/16/2023    8:20 AM 03/06/2023    9:31 AM  Vitals with BMI  Weight  223 lbs   Systolic 123 144 130  Diastolic 56 77 68  Pulse  58 86     Physical Exam  Gen: Alert, well appearing.  Patient is oriented to person, place, time, and situation. AFFECT: pleasant, lucid thought and speech. CV: RRR, no m/r/g.   LUNGS: CTA bilat, nonlabored resps, good aeration in all lung fields. Extremities: 2+ pitting on the right lower leg and trace pitting on the left lower leg.  LABS:  Last CBC Lab Results  Component Value Date   WBC 10.4 04/13/2022   HGB 14.1 04/13/2022   HCT 42.8 04/13/2022   MCV 88.5 04/13/2022   MCH 28.9 04/09/2022   RDW  14.8 04/13/2022   PLT 183.0 04/13/2022   Last metabolic panel Lab Results  Component Value Date   GLUCOSE 71 10/13/2022   NA 139 10/13/2022   K 4.2 10/13/2022   CL 104 10/13/2022   CO2 27 10/13/2022   BUN 30 (H) 10/13/2022   CREATININE 1.17 10/13/2022   GFR 58.47 (L) 10/13/2022   CALCIUM 8.9 10/13/2022   PHOS 2.9 10/27/2021   PROT 6.6 10/13/2022   ALBUMIN 4.2 10/13/2022   BILITOT 0.4 10/13/2022   ALKPHOS 96 10/13/2022   AST 19 10/13/2022   ALT 21 10/13/2022   ANIONGAP 7 04/09/2022  Last lipids Lab Results  Component Value Date   CHOL 120 10/13/2022   HDL 34.50 (L) 10/13/2022   LDLCALC 57 04/13/2022   LDLDIRECT 63.0 10/13/2022   TRIG 203.0 (H) 10/13/2022   CHOLHDL 3 10/13/2022   Last hemoglobin A1c Lab Results  Component Value Date   HGBA1C 5.4 10/13/2022   HGBA1C 5.4 10/13/2022   HGBA1C 5.4 (A) 10/13/2022   HGBA1C 5.4 10/13/2022   IMPRESSION AND PLAN:  #1 hypertension, well-controlled on lisinopril 30 mg a day.  2.  Hypercholesterolemia, doing well on Lipitor 20 mg a day. Lipid panel and hepatic panel today.   #3 prediabetes.  Doing well with working on therapeutic lifestyle changes. Last hemoglobin A1c was 5.4% about 6 months ago. Repeat hemoglobin A1c today..   #4 bilateral lower extremity edema: Very stable on as needed use of small dose of Lasix.  Continue low-sodium diet and compression stockings as needed.  An After Visit Summary was printed and given to the patient.  FOLLOW UP: Return in about 6 months (around 10/15/2023) for routine chronic illness f/u.  Signed:  Santiago Bumpers, MD           04/16/2023

## 2023-04-22 ENCOUNTER — Other Ambulatory Visit: Payer: Self-pay | Admitting: Family Medicine

## 2023-04-24 ENCOUNTER — Other Ambulatory Visit: Payer: Self-pay

## 2023-04-24 MED ORDER — LISINOPRIL 30 MG PO TABS: 15.0000 mg | ORAL_TABLET | Freq: Every day | ORAL | Status: DC

## 2023-04-26 ENCOUNTER — Encounter: Payer: Self-pay | Admitting: Family Medicine

## 2023-04-26 ENCOUNTER — Ambulatory Visit (INDEPENDENT_AMBULATORY_CARE_PROVIDER_SITE_OTHER): Payer: Medicare Other | Admitting: Family Medicine

## 2023-04-26 VITALS — BP 128/73 | HR 67 | Wt 225.2 lb

## 2023-04-26 DIAGNOSIS — E875 Hyperkalemia: Secondary | ICD-10-CM | POA: Diagnosis not present

## 2023-04-26 DIAGNOSIS — I10 Essential (primary) hypertension: Secondary | ICD-10-CM

## 2023-04-26 NOTE — Progress Notes (Signed)
OFFICE VISIT  04/26/2023  CC:  Chief Complaint  Patient presents with   Hypertension    F/U. Pt has been taking BP at home and BP has been averaging 118/60. Pt brought his home cuff to compare today. Pts reading today: 145/67    Patient is a 82 y.o. male who presents for 10-day follow-up blood pressure and potassium level. A/P as of last visit: "1 hypertension, well-controlled on lisinopril 30 mg a day.   2.  Hypercholesterolemia, doing well on Lipitor 20 mg a day. Lipid panel and hepatic panel today.   #3 prediabetes.  Doing well with working on therapeutic lifestyle changes. Last hemoglobin A1c was 5.4% about 6 months ago. Repeat hemoglobin A1c today..   #4 bilateral lower extremity edema: Very stable on as needed use of small dose of Lasix.  Continue low-sodium diet and compression stockings as needed."  INTERIM HX: Labs last visit showed potassium elevated and I recommended that he take only half of his 30 mg lisinopril tab.  Home blood pressures since that time have been in the 120s over 70s. He feels well.    Past Medical History:  Diagnosis Date   Chronic renal insufficiency, stage 3 (moderate) (HCC)    Diverticulosis 2013   noted on colonoscopy   Duodenal ulcer    Esophagitis    Fatty liver 05/2012   u/s done for mild/persistent elevation of LFTs   GI bleed    Hearing impairment    11/2015 Audiology eval= not yet ready for hearing aids ([facility])   History of adenomatous polyp of colon    On multiple colonoscopies: most recent colonoscopy 05/31/18-->adenoma x 2, repeat 3-5 ys is optional.   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   Microhematuria 09/2017   On dipstick only.  Urine micro 09/2017 and 04/2018-->no RBCs.   OAB (overactive bladder)    ditropan xl 10 ineffective--pt d/c'd this 09/2018   Obesity     Osteoarthritis    Hips (L>R on x-ray 04/2013), shoulders, and knees (hx of adhesive capsulitis of shoulder)   Osteoarthritis of left ankle and foot    Prediabetes    A1c 6.1% 08/2019 at [facility]   Rectal leakage    UGI bleed 10/26/2021   duodenal ulcer-->transfused 2 U pRBCs    Past Surgical History:  Procedure Laterality Date   BIOPSY  10/27/2021   Procedure: BIOPSY;  Surgeon: Tressia Danas, MD;  Location: [facility] ENDOSCOPY;  Service: Gastroenterology;;   COLONOSCOPY     2008 w/polyps,  09-22-2011 w/polyps.  01/2015 tubular adenoma x 1.  05/31/18 colonoscopy w/ two tubular adenomas.   COLONOSCOPY W/ POLYPECTOMY  2008; 09/22/11;01/2015   Tubular adenoma--recall 3 yrs per Dr. Tobin Chad at [facility].  Then switched to Dr. Rhea Belton with .   ELBOW SURGERY     Left ulnar decompression 2024   ESOPHAGOGASTRODUODENOSCOPY  10/27/2021   duodenal ulcer, h pylori NEG   ESOPHAGOGASTRODUODENOSCOPY (EGD) WITH PROPOFOL N/A 10/27/2021   Procedure: ESOPHAGOGASTRODUODENOSCOPY (EGD) WITH PROPOFOL;  Surgeon: Tressia Danas, MD;  Location: [facility] ENDOSCOPY;  Service: Gastroenterology;  Laterality: N/A;   POLYPECTOMY     REVERSE SHOULDER ARTHROPLASTY Left 10/17/2021   Procedure: LEFT REVERSE SHOULDER ARTHROPLASTY;  Surgeon: Cammy Copa, MD;  Location: [facility] OR;  Service: Orthopedics;  Laterality: Left;  TONSILLECTOMY     TRANSTHORACIC ECHOCARDIOGRAM  10/27/2021   10/2021: grd I DD, o/w normal   UMBILICAL HERNIA REPAIR  2011   WISDOM TOOTH EXTRACTION Bilateral     Outpatient Medications Prior to Visit  Medication Sig Dispense Refill   acetaminophen (TYLENOL) 500 MG tablet Take 500-1,000 mg by mouth in the morning and at bedtime.     atorvastatin (LIPITOR) 20 MG tablet Take 1 tablet (20 mg total) by mouth daily. 90 tablet 1   cholecalciferol (VITAMIN D) 25 MCG (1000 UNIT) tablet Take 1,000 Units by mouth daily.     diclofenac Sodium (VOLTAREN) 1 % GEL Apply 2 g topically 4 (four)  times daily as needed. 100 g 0   furosemide (LASIX) 20 MG tablet 1 tab po every other day 45 tablet 3   lisinopril (ZESTRIL) 30 MG tablet Take 0.5 tablets (15 mg total) by mouth daily. TAKE 1 TABLET BY MOUTH EVERY DAY     meclizine (ANTIVERT) 25 MG tablet Take 1-2 tablets (25-50 mg total) by mouth 3 (three) times daily as needed for dizziness. 30 tablet 1   gabapentin (NEURONTIN) 100 MG capsule TAKE 1 CAPSULE (100 MG TOTAL) BY MOUTH THREE TIMES DAILY. (Patient not taking: Reported on 04/16/2023) 90 capsule 0   pantoprazole (PROTONIX) 40 MG tablet Take 1 tablet (40 mg total) by mouth 2 (two) times daily before a meal. (Patient not taking: Reported on 04/16/2023) 60 tablet 3   senna (SENOKOT) 8.6 MG TABS tablet Take 1 tablet by mouth daily as needed for mild constipation or moderate constipation. (Patient not taking: Reported on 04/16/2023)     No facility-administered medications prior to visit.    No Known Allergies  Review of Systems As per HPI  PE:    04/26/2023    1:46 PM 04/16/2023    8:22 AM 04/16/2023    8:20 AM  Vitals with BMI  Weight 225 lbs 3 oz  223 lbs  Systolic 128 123 295  Diastolic 73 56 77  Pulse 67  58     Physical Exam  Gen: Alert, well appearing.  Patient is oriented to person, place, time, and situation. AFFECT: pleasant, lucid thought and speech. No further exam today  LABS:  Last metabolic panel Lab Results  Component Value Date   GLUCOSE 88 04/16/2023   NA 137 04/16/2023   K 5.3 No hemolysis seen (H) 04/16/2023   CL 103 04/16/2023   CO2 27 04/16/2023   BUN 26 (H) 04/16/2023   CREATININE 1.29 04/16/2023   GFR 51.83 (L) 04/16/2023   CALCIUM 9.3 04/16/2023   PHOS 2.9 10/27/2021   PROT 6.9 04/16/2023   ALBUMIN 4.2 04/16/2023   BILITOT 0.7 04/16/2023   ALKPHOS 96 04/16/2023   AST 21 04/16/2023   ALT 26 04/16/2023   ANIONGAP 7 04/09/2022   Last lipids Lab Results  Component Value Date   CHOL 133 04/16/2023   HDL 45.60 04/16/2023   LDLCALC 63  04/16/2023   LDLDIRECT 63.0 10/13/2022   TRIG 118.0 04/16/2023   CHOLHDL 3 04/16/2023   Last hemoglobin A1c Lab Results  Component Value Date   HGBA1C 5.8 04/16/2023   IMPRESSION AND PLAN:  #1 hypertension, well-controlled on lisinopril 1/2 of a 30 milligram tab a day.  #2 hyperkalemia, most recent potassium 5.3. We cut lisinopril to one half of the 30 mg tab daily. Recheck basic metabolic panel today.  An After Visit Summary was printed and given to the patient.  FOLLOW UP: Return  for Keep appointment set for 10/15/23.  Signed:  Santiago Bumpers, MD           04/26/2023

## 2023-04-27 LAB — BASIC METABOLIC PANEL
BUN: 29 mg/dL — ABNORMAL HIGH (ref 6–23)
CO2: 26 meq/L (ref 19–32)
Calcium: 9 mg/dL (ref 8.4–10.5)
Chloride: 103 meq/L (ref 96–112)
Creatinine, Ser: 1.11 mg/dL (ref 0.40–1.50)
GFR: 62.05 mL/min (ref >60.00)
Glucose, Bld: 102 mg/dL — ABNORMAL HIGH (ref 70–99)
Potassium: 4.7 meq/L (ref 3.5–5.1)
Sodium: 138 meq/L (ref 135–145)

## 2023-04-29 ENCOUNTER — Telehealth: Payer: Self-pay | Admitting: Family Medicine

## 2023-04-29 NOTE — Telephone Encounter (Signed)
A user error has taken place: encounter opened in error, closed for administrative reasons.

## 2023-06-06 ENCOUNTER — Ambulatory Visit: Payer: Medicare Other | Admitting: Podiatry

## 2023-06-06 ENCOUNTER — Encounter: Payer: Self-pay | Admitting: Podiatry

## 2023-06-06 DIAGNOSIS — M79674 Pain in right toe(s): Secondary | ICD-10-CM | POA: Diagnosis not present

## 2023-06-06 DIAGNOSIS — I739 Peripheral vascular disease, unspecified: Secondary | ICD-10-CM

## 2023-06-06 DIAGNOSIS — M79675 Pain in left toe(s): Secondary | ICD-10-CM | POA: Diagnosis not present

## 2023-06-06 DIAGNOSIS — B351 Tinea unguium: Secondary | ICD-10-CM

## 2023-06-06 NOTE — Progress Notes (Signed)
  Subjective:  Patient ID: Gregory Blevins, male    DOB: 10-Jul-1941,   MRN: [id number]  Chief Complaint  Patient presents with   Nail Problem    RFC.    82 y.o. male presents for concern of thickened elongated and painful nails that are difficult to trim. Requesting to have them trimmed today. He has a history of PAD and at risk for foot care.   PCP:  Jeoffrey Massed, MD    . Denies any other pedal complaints. Denies n/v/f/c.   Past Medical History:  Diagnosis Date   Chronic renal insufficiency, stage 3 (moderate) (HCC)    Diverticulosis 2013   noted on colonoscopy   Duodenal ulcer    Esophagitis    Fatty liver 05/2012   u/s done for mild/persistent elevation of LFTs   GI bleed    Hearing impairment    11/2015 Audiology eval= not yet ready for hearing aids ([facility])   History of adenomatous polyp of colon    On multiple colonoscopies: most recent colonoscopy 05/31/18-->adenoma x 2, repeat 3-5 ys is optional.   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   Microhematuria 09/2017   On dipstick only.  Urine micro 09/2017 and 04/2018-->no RBCs.   OAB (overactive bladder)    ditropan xl 10 ineffective--pt d/c'd this 09/2018   Obesity    Osteoarthritis    Hips (L>R on x-ray 04/2013), shoulders, and knees (hx of adhesive capsulitis of shoulder)   Osteoarthritis of left ankle and foot    Prediabetes    A1c 6.1% 08/2019 at [facility]   Rectal leakage    UGI bleed 10/26/2021   duodenal ulcer-->transfused 2 U pRBCs    Objective:  Physical Exam: Vascular: DP/PT pulses 2/4 bilateral. CFT <3 seconds. Normal hair growth on digits. No edema.  Skin. No lacerations or abrasions bilateral feet. Nails 1-5 bilateral are thickened dystrophic and with subungual debris.  Musculoskeletal: MMT 5/5 bilateral lower extremities in DF, PF,  Inversion and Eversion. Deceased ROM in DF of ankle joint.  Neurological: Sensation intact to light touch.   Assessment:   1. Pain due to onychomycosis of toenails of both feet   2. PAD (peripheral artery disease) (HCC)      Plan:  Patient was evaluated and treated and all questions answered..  -Mechanically debrided all nails 1-5 bilateral using sterile nail nipper and filed with dremel without incident  -Answered all patient questions -Patient to return  in 3 months for at risk foot care -Patient advised to call the office if any problems or questions arise in the meantime.   Louann Sjogren, DPM

## 2023-07-04 IMAGING — CT CT SHOULDER*L* W/O CM
2 series · 15 of 28 positions shown, 19 images · non-contrast
Comparison: None.

CLINICAL DATA: Chronic left shoulder pain. Preoperative evaluation.

EXAM:
CT OF THE UPPER LEFT EXTREMITY WITHOUT CONTRAST
TECHNIQUE: Multidetector CT imaging of the upper left extremity was performed
according to the standard protocol.
RADIATION DOSE REDUCTION: This exam was performed according to the
departmental dose-optimization program which includes automated
exposure control, adjustment of the mA and/or kV according to
patient size and/or use of iterative reconstruction technique.

[Series 2: shoulder 0.60 br40 s3 thins soft · axial · 0.47mm/px · z∈[-1112,-948]mm · 10 of 324 slices shown, 13 images]
[im 25/324  soft-tissue]
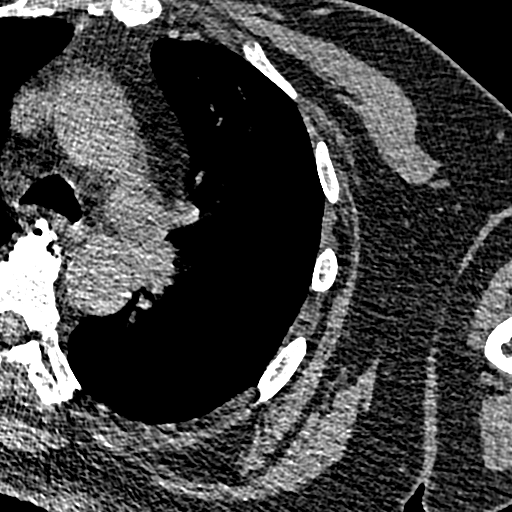
[im 25/324  bone]
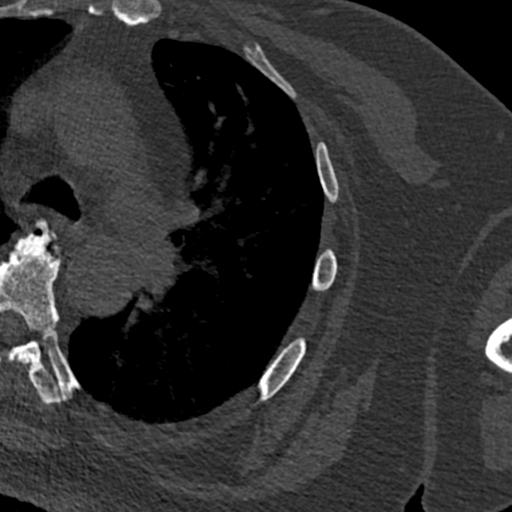
[im 50/324  bone]
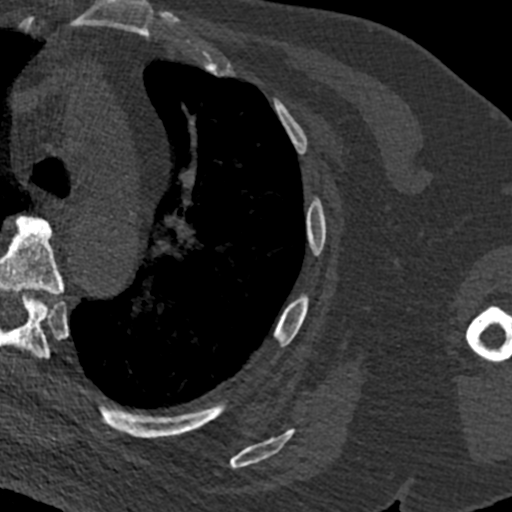
[im 100/324  bone]
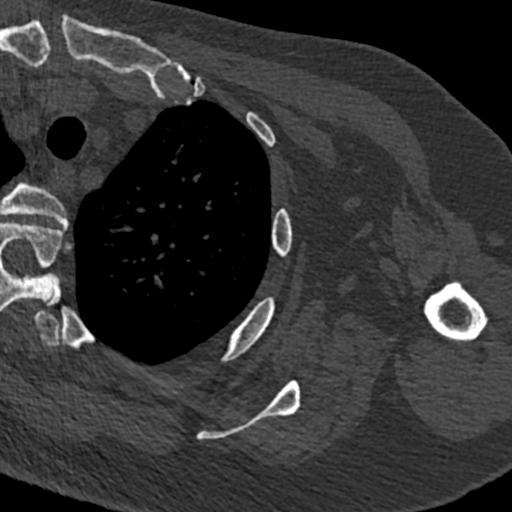
[im 125/324  bone]
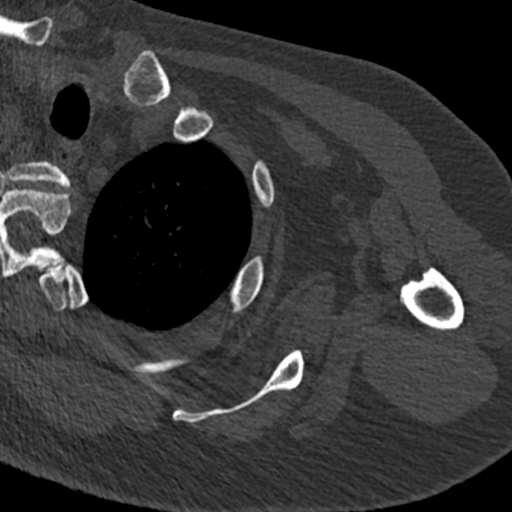
[im 150/324  soft-tissue]
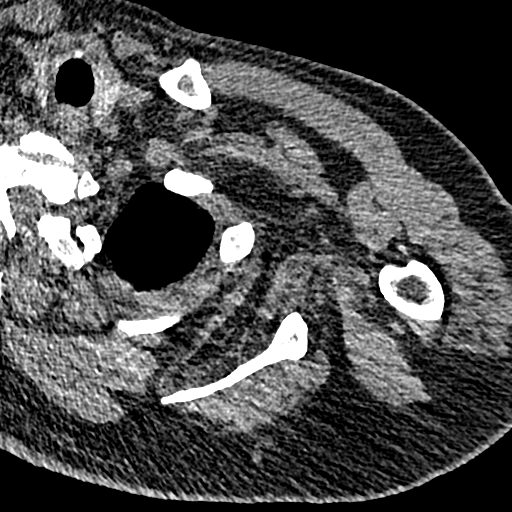
[im 150/324  bone]
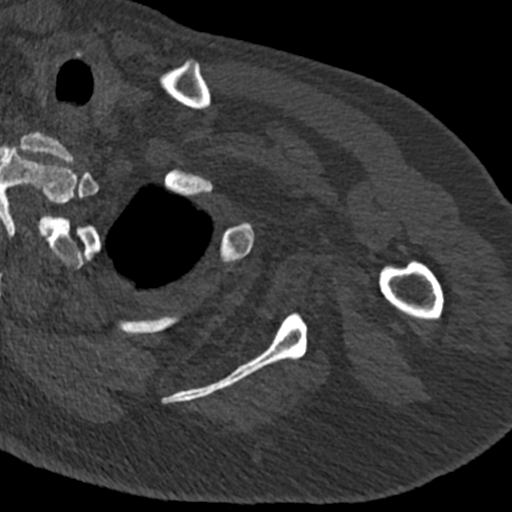
[im 174/324  bone]
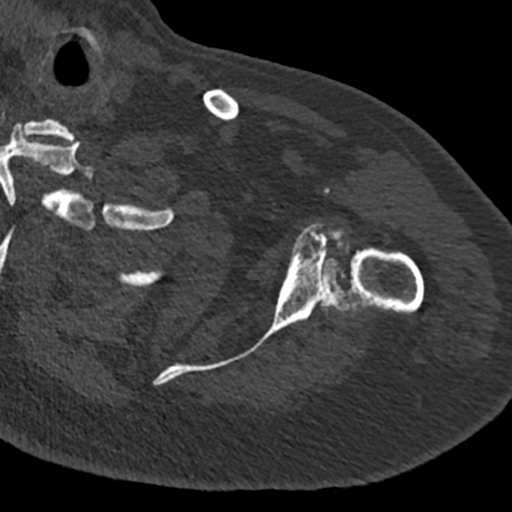
[im 199/324  bone]
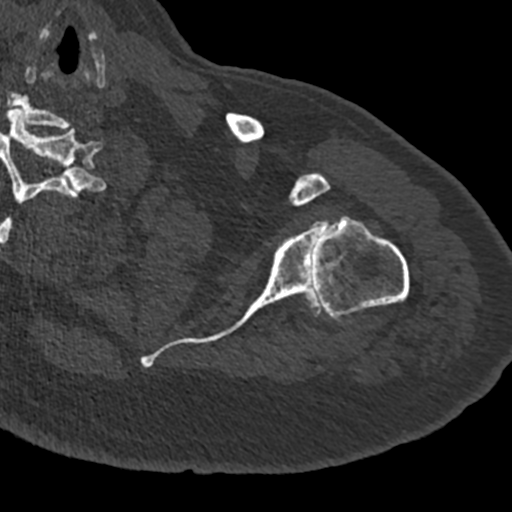
[im 249/324  bone]
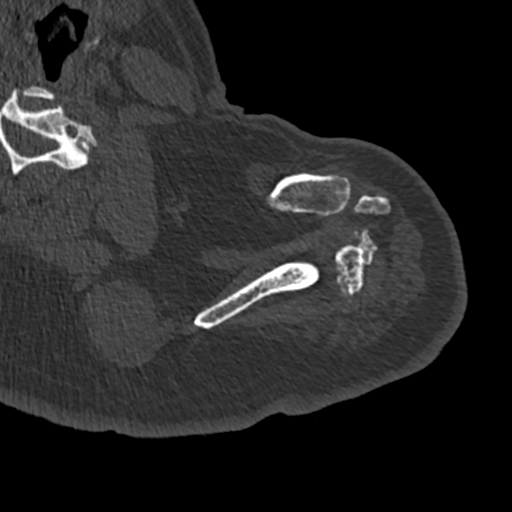
[im 274/324  soft-tissue]
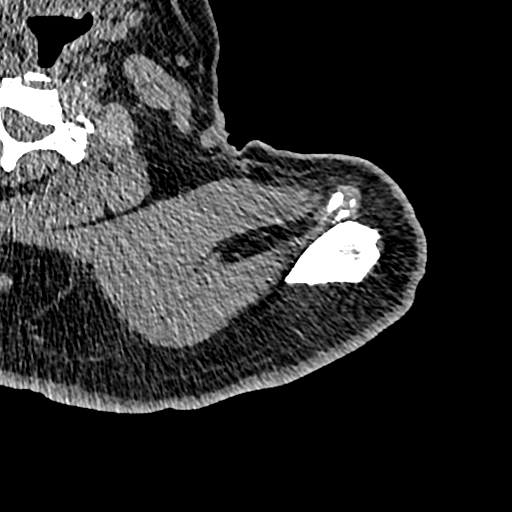
[im 274/324  bone]
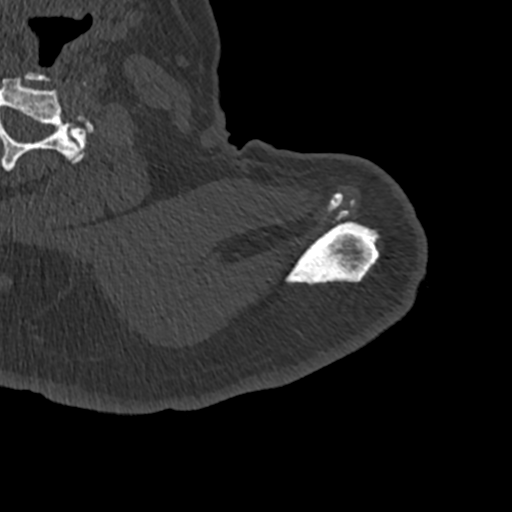
[im 299/324  bone]
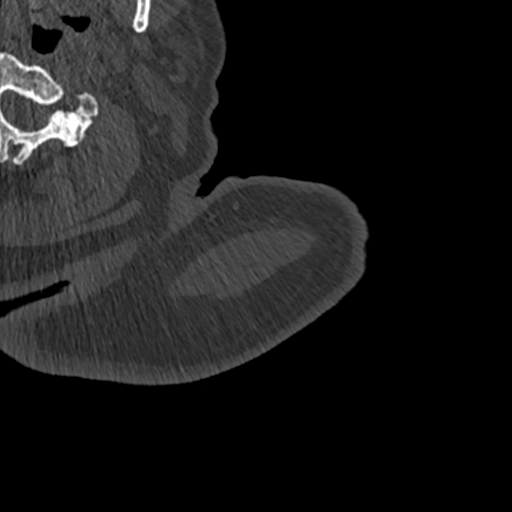

[Series 12: shoulder 2.00 br40 s3 sag soft · sagittal · 0.49mm/px · 5 of 129 slices shown, 6 images]
[im 43/129  bone]
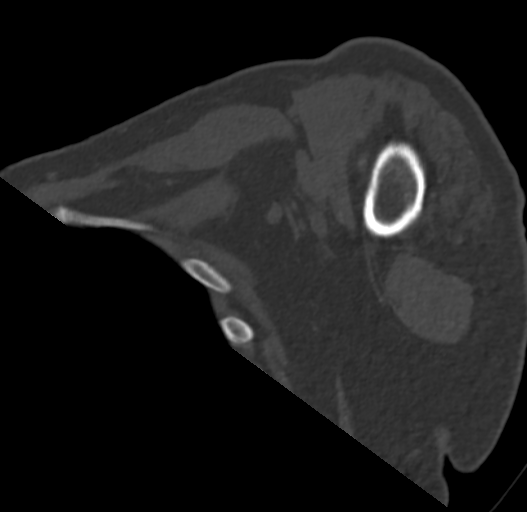
[im 54/129  bone]
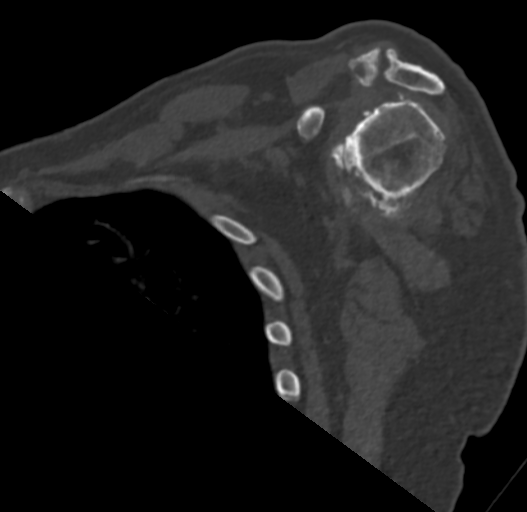
[im 65/129  soft-tissue]
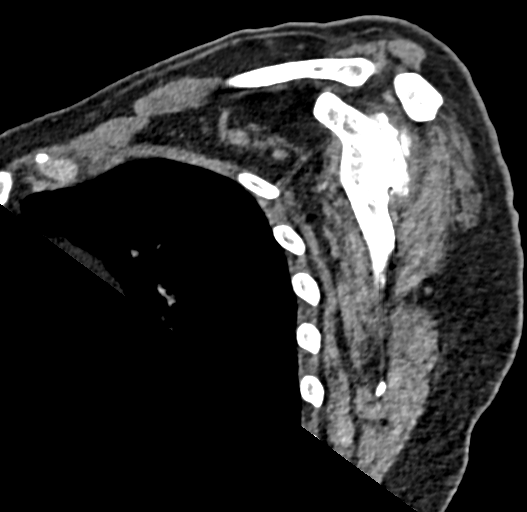
[im 65/129  bone]
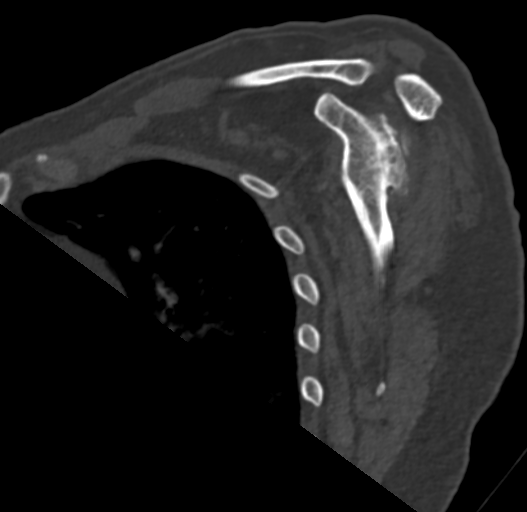
[im 75/129  bone]
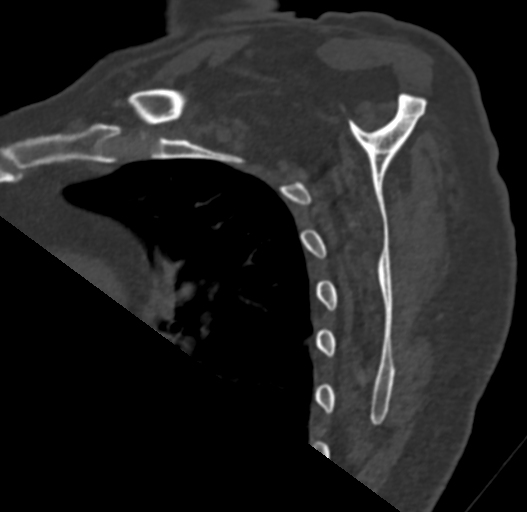
[im 86/129  bone]
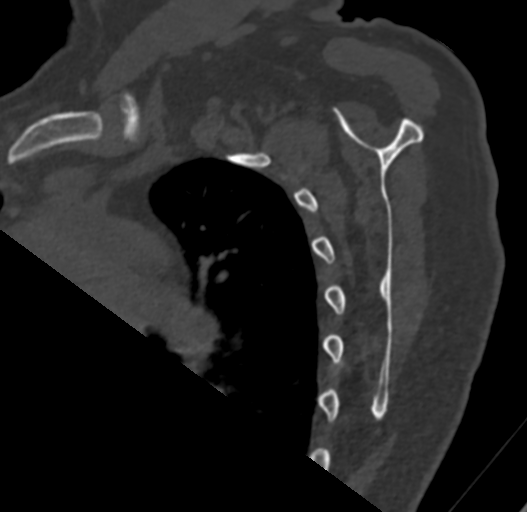

[15 of 28 positions shown; findings below may reference images not displayed]

FINDINGS: Bones/Joint/Cartilage

No fracture or dislocation. Normal alignment. No joint effusion.

Severe osteoarthritis of the glenohumeral joint with severe joint
space narrowing, a bone-on-bone appearance, subchondral sclerosis,
subchondral cystic changes and marginal osteophytosis. Posterior
sloping of the glenoid with fragmentation of the posterior corner of
the glenoid.

Moderate arthropathy of the acromioclavicular joint.

Partially visualized cervical spine spondylosis. Left foraminal
stenosis at C4-5, C5-6 and C6-7.

Generalized osteopenia.

Ligaments

Ligaments are suboptimally evaluated by CT.

Muscles and Tendons
Mild atrophy of the subscapularis muscle. Mild atrophy of the
supraspinatus muscle. Thinning of the supraspinatus tendon and
infraspinatus tendon.

Soft tissue
No fluid collection or hematoma. No soft tissue mass. Visualized
portions of the lung are clear.
IMPRESSION: 1. Severe osteoarthritis of the glenohumeral joint.
2. Mild atrophy of the subscapularis muscle and supraspinatus
muscle. Thinning of the supraspinatus tendon and infraspinatus
tendon.
3. Left foraminal stenosis at C4-5, C5-6 and C6-7.

## 2023-07-18 ENCOUNTER — Ambulatory Visit: Payer: Medicare Other | Admitting: *Deleted

## 2023-07-18 DIAGNOSIS — Z0000 Encounter for general adult medical examination without abnormal findings: Secondary | ICD-10-CM

## 2023-07-18 NOTE — Progress Notes (Signed)
 Subjective:   Gregory Blevins is a 83 y.o. male who presents for Medicare Annual/Subsequent preventive examination.  Visit Complete: Virtual I connected with  Lamar GORMAN Dimes on 07/18/23 by a audio enabled telemedicine application and verified that I am speaking with the correct person using two identifiers.  Patient Location: Home  Provider Location: Home Office  I discussed the limitations of evaluation and management by telemedicine. The patient expressed understanding and agreed to proceed.  Vital Signs: Because this visit was a virtual/telehealth visit, some criteria may be missing or patient reported. Any vitals not documented were not able to be obtained and vitals that have been documented are patient reported.  Unable to do Video visit    Cardiac Risk Factors include: advanced age (>96men, >60 women);male gender;hypertension     Objective:    There were no vitals filed for this visit. There is no height or weight on file to calculate BMI.     07/18/2023    8:51 AM 06/28/2022   11:47 AM 04/20/2022    4:55 AM 10/27/2021    7:32 PM 10/27/2021   12:32 PM 10/11/2021   11:33 AM 10/10/2021   12:57 AM  Advanced Directives  Does Patient Have a Medical Advance Directive? Yes Yes No Yes Yes Yes No  Type of Estate Agent of [employer] Power of [place];Living will  Healthcare Power of [place] of [place];Living will Living will;Healthcare Power of Attorney   Does patient want to make changes to medical advance directive?  No - Patient declined  No - Patient declined     Copy of Healthcare Power of Attorney in Chart? No - copy requested Yes - validated most recent copy scanned in chart (See row information)  No - copy requested No - copy requested No - copy requested   Would patient like information on creating a medical advance directive?   No - Patient declined        Current Medications (verified) Outpatient Encounter  Medications as of 07/18/2023  Medication Sig   acetaminophen  (TYLENOL ) 500 MG tablet Take 500-1,000 mg by mouth in the morning and at bedtime.   atorvastatin  (LIPITOR) 20 MG tablet Take 1 tablet (20 mg total) by mouth daily.   cholecalciferol (VITAMIN D ) 25 MCG (1000 UNIT) tablet Take 1,000 Units by mouth daily.   diclofenac  Sodium (VOLTAREN ) 1 % GEL Apply 2 g topically 4 (four) times daily as needed.   lisinopril  (ZESTRIL ) 30 MG tablet Take 0.5 tablets (15 mg total) by mouth daily. TAKE 1 TABLET BY MOUTH EVERY DAY   meclizine  (ANTIVERT ) 25 MG tablet Take 1-2 tablets (25-50 mg total) by mouth 3 (three) times daily as needed for dizziness.   furosemide  (LASIX ) 20 MG tablet 1 tab po every other day (Patient not taking: Reported on 07/18/2023)   gabapentin  (NEURONTIN ) 100 MG capsule TAKE 1 CAPSULE (100 MG TOTAL) BY MOUTH THREE TIMES DAILY. (Patient not taking: Reported on 04/16/2023)   pantoprazole  (PROTONIX ) 40 MG tablet Take 1 tablet (40 mg total) by mouth 2 (two) times daily before a meal. (Patient not taking: Reported on 04/16/2023)   senna (SENOKOT) 8.6 MG TABS tablet Take 1 tablet by mouth daily as needed for mild constipation or moderate constipation. (Patient not taking: Reported on 04/16/2023)   No facility-administered encounter medications on file as of 07/18/2023.    Allergies (verified) Patient has no known allergies.   History: Past Medical History:  Diagnosis Date   Chronic renal insufficiency,  stage 3 (moderate) (HCC)    Diverticulosis 2013   noted on colonoscopy   Duodenal ulcer    Esophagitis    Fatty liver 05/2012   u/s done for mild/persistent elevation of LFTs   GI bleed    Hearing impairment    11/2015 Audiology eval= not yet ready for hearing aids ([facility])   History of adenomatous polyp of colon    On multiple colonoscopies: most recent colonoscopy 05/31/18-->adenoma x 2, repeat 3-5 ys is optional.   History of basal cell carcinoma of skin    HTN  (hypertension)    Hyperlipidemia    Internal hemorrhoids    Lumbar spondylosis 04/2013   with grade I spondylolisthesis L4 on L5.   Microhematuria 09/2017   On dipstick only.  Urine micro 09/2017 and 04/2018-->no RBCs.   OAB (overactive bladder)    ditropan  xl 10 ineffective--pt d/c'd this 09/2018   Obesity    Osteoarthritis    Hips (L>R on x-ray 04/2013), shoulders, and knees (hx of adhesive capsulitis of shoulder)   Osteoarthritis of left ankle and foot    Prediabetes    A1c 6.1% 08/2019 at [facility]   Rectal leakage    UGI bleed 10/26/2021   duodenal ulcer-->transfused 2 U pRBCs   Past Surgical History:  Procedure Laterality Date   BIOPSY  10/27/2021   Procedure: BIOPSY;  Surgeon: Eda Iha, MD;  Location[facility] ENDOSCOPY;  Service: Gastroenterology;;   COLONOSCOPY     2008 w/polyps,  09-22-2011 w/polyps.  01/2015 tubular adenoma x 1.  05/31/18 colonoscopy w/ two tubular adenomas.   COLONOSCOPY W/ POLYPECTOMY  2008; 09/22/11;01/2015   Tubular adenoma--recall 3 yrs per Dr. Maryln FORBES [place] at [facility].  Then switched to Dr. Albertus with [place].   ELBOW SURGERY     Left ulnar decompression 2024   ESOPHAGOGASTRODUODENOSCOPY  10/27/2021   duodenal ulcer, h pylori NEG   ESOPHAGOGASTRODUODENOSCOPY (EGD) WITH PROPOFOL  N/A 10/27/2021   Procedure: ESOPHAGOGASTRODUODENOSCOPY (EGD) WITH PROPOFOL ;  Surgeon: Eda Iha, MD;  Location: [facility] ENDOSCOPY;  Service: Gastroenterology;  Laterality: N/A;   POLYPECTOMY     REVERSE SHOULDER ARTHROPLASTY Left 10/17/2021   Procedure: LEFT REVERSE SHOULDER ARTHROPLASTY;  Surgeon: Addie Cordella Hamilton, MD;  Location: [facility] OR;  Service: Orthopedics;  Laterality: Left;   TONSILLECTOMY     TRANSTHORACIC ECHOCARDIOGRAM  10/27/2021   10/2021: grd I DD, o/w normal   UMBILICAL HERNIA REPAIR  2011   WISDOM TOOTH EXTRACTION Bilateral    Family History  Problem Relation Age of Onset   Hypertension Father    Alcoholism Father    Stomach cancer Father         primary with mets to colon   Colon cancer Father    Dementia Mother    Asthma Brother    Diabetes Sister    Colon polyps Neg Hx    Esophageal cancer Neg Hx    Rectal cancer Neg Hx    Social History   Socioeconomic History   Marital status: Married    Spouse name: Elijah   Number of children: 2   Years of education: Not on file   Highest education level: Not on file  Occupational History   Not on file  Tobacco Use   Smoking status: Never   Smokeless tobacco: Never  Vaping Use   Vaping status: Never Used  Substance and Sexual Activity   Alcohol use: Yes    Alcohol/week: 1.0 standard drink of alcohol    Types:  1 Cans of beer per week    Comment: occassional   Drug use: No   Sexual activity: Yes  Other Topics Concern   Not on file  Social History Narrative   Married, 2 children, 3 grandchildren.   Occupation: retired chief executive officer   Education: HS   No tob, minimal alcohol, no drugs.    Social Drivers of Corporate Investment Banker Strain: Low Risk  (07/18/2023)   Overall Financial Resource Strain (CARDIA)    Difficulty of Paying Living Expenses: Not hard at all  Food Insecurity: No Food Insecurity (07/18/2023)   Hunger Vital Sign    Worried About Running Out of Food in the Last Year: Never true    Ran Out of Food in the Last Year: Never true  Transportation Needs: No Transportation Needs (07/18/2023)   PRAPARE - Administrator, Civil Service (Medical): No    Lack of Transportation (Non-Medical): No  Physical Activity: Inactive (07/18/2023)   Exercise Vital Sign    Days of Exercise per Week: 0 days    Minutes of Exercise per Session: 0 min  Stress: No Stress Concern Present (07/18/2023)   [employer] of Occupational Health - Occupational Stress Questionnaire    Feeling of Stress : Not at all  Social Connections: Socially Integrated (07/18/2023)   Social Connection and Isolation Panel [NHANES]    Frequency of Communication with  Friends and Family: Three times a week    Frequency of Social Gatherings with Friends and Family: Once a week    Attends Religious Services: More than 4 times per year    Active Member of [employer] or Organizations: Yes    Attends Engineer, Structural: More than 4 times per year    Marital Status: Married    Tobacco Counseling Counseling given: Not Answered   Clinical Intake:  Pre-visit preparation completed: Yes  Pain : No/denies pain     Diabetes: No  How often do you need to have someone help you when you read instructions, pamphlets, or other written materials from your doctor or pharmacy?: 1 - Never  Interpreter Needed?: No  Information entered by :: Mliss Graff LPN   Activities of Daily Living    07/18/2023    8:53 AM  In your present state of health, do you have any difficulty performing the following activities:  Hearing? 1  Vision? 0  Difficulty concentrating or making decisions? 0  Walking or climbing stairs? 0  Dressing or bathing? 0  Doing errands, shopping? 0  Preparing Food and eating ? N  Using the Toilet? N  In the past six months, have you accidently leaked urine? Y  Do you have problems with loss of bowel control? N  Managing your Medications? N  Managing your Finances? N  Housekeeping or managing your Housekeeping? N    Patient Care Team: Candise Aleene DEL, MD as PCP - General (Family Medicine) Pyrtle, Gordy HERO, MD as Consulting Physician (Gastroenterology) [facility] Addie, Cordella Hamilton, MD as Consulting Physician (Orthopedic Surgery) Magdalen Pasco RAMAN, DPM as Consulting Physician (Podiatry)  Indicate any recent Medical Services you may have received from other than [facility] providers in the past year (date may be approximate).     Assessment:   This is a routine wellness examination for Gregory Blevins.  Hearing/Vision screen Hearing Screening - Comments:: Bilateral hearing aids Vision Screening - Comments:: [facility] [place] Up  to date   Goals Addressed  This Visit's Progress    Patient Stated   Not on track    Would like to lose 10 pounds     Patient Stated   On track    None at this time      Patient Stated       Continue things way they are       Depression Screen    07/18/2023    8:55 AM 04/16/2023    8:24 AM 12/27/2022    2:46 PM 06/28/2022   11:37 AM 04/13/2022    8:17 AM 06/22/2021   11:03 AM 06/09/2020    9:47 AM  PHQ 2/9 Scores  PHQ - 2 Score 0 0 0 0 0 0 0  PHQ- 9 Score 0  0        Fall Risk    07/18/2023    8:51 AM 04/16/2023    8:24 AM 12/27/2022    2:44 PM 06/28/2022   11:37 AM 04/13/2022    8:18 AM  Fall Risk   Falls in the past year? 0 0 0 0 0  Number falls in past yr: 0  0 0 0  Injury with Fall? 0  0 0 0  Risk for fall due to :   No Fall Risks  Impaired vision  Follow up Falls evaluation completed;Education provided;Falls prevention discussed Falls evaluation completed Falls evaluation completed Falls evaluation completed Falls evaluation completed    MEDICARE RISK AT HOME: Medicare Risk at Home Any stairs in or around the home?: No If so, are there any without handrails?: No Home free of loose throw rugs in walkways, pet beds, electrical cords, etc?: Yes Adequate lighting in your home to reduce risk of falls?: Yes Life alert?: No Use of a cane, walker or w/c?: No Grab bars in the bathroom?: Yes Shower chair or bench in shower?: Yes Elevated toilet seat or a handicapped toilet?: No  TIMED UP AND GO:  Was the test performed?  No    Cognitive Function:    04/11/2019    8:16 AM 04/09/2018    8:25 AM  MMSE - Mini Mental State Exam  Orientation to time 5 5  Orientation to Place 5 5  Registration 3 3  Attention/ Calculation 5 5  Recall 2 1  Language- name 2 objects 2 2  Language- repeat 1 1  Language- follow 3 step command 3 3  Language- read & follow direction 1 1  Write a sentence 1 1  Copy design 1 1  Total score 29 28        07/18/2023    8:56  AM 06/28/2022   11:42 AM 06/22/2021   11:07 AM 06/09/2020    9:52 AM  6CIT Screen  What Year? 0 points 0 points 0 points 0 points  What month? 0 points 0 points 0 points 0 points  What time? 0 points 0 points 0 points 0 points  Count back from 20 0 points 0 points 0 points 0 points  Months in reverse 0 points 0 points 0 points 0 points  Repeat phrase 2 points 6 points 0 points 2 points  Total Score 2 points 6 points 0 points 2 points    Immunizations Immunization History  Administered Date(s) Administered   Fluad Quad(high Dose 65+) 04/11/2019, 04/15/2020, 04/15/2021, 04/13/2022   Fluad Trivalent(High Dose 65+) 04/16/2023   Influenza Split 07/14/2011, 05/13/2012   Influenza, High Dose Seasonal PF 04/16/2015, 03/22/2016, 03/21/2017, 04/09/2018   Influenza,inj,Quad PF,6+ Mos 03/21/2013, 04/10/2014  Influenza-Unspecified 05/13/1997, 05/11/2019, 04/26/2020   Moderna Sars-Covid-2 Vaccination 08/13/2019, 09/09/2019, 06/17/2020   Pneumococcal Conjugate-13 04/10/2014   Pneumococcal Polysaccharide-23 07/19/2009   Td 06/17/2009   Zoster, Live 07/05/2010    TDAP status: Due, Education has been provided regarding the importance of this vaccine. Advised may receive this vaccine at local pharmacy or Health Dept. Aware to provide a copy of the vaccination record if obtained from local pharmacy or Health Dept. Verbalized acceptance and understanding.  Flu Vaccine status: Up to date  Pneumococcal vaccine status: Up to date  Covid-19 vaccine status: Declined, Education has been provided regarding the importance of this vaccine but patient still declined. Advised may receive this vaccine at local pharmacy or Health Dept.or vaccine clinic. Aware to provide a copy of the vaccination record if obtained from local pharmacy or Health Dept. Verbalized acceptance and understanding.  Qualifies for Shingles Vaccine? Yes   Zostavax completed No   Shingrix  Completed?: No.    Education has been provided  regarding the importance of this vaccine. Patient has been advised to call insurance company to determine out of pocket expense if they have not yet received this vaccine. Advised may also receive vaccine at local pharmacy or Health Dept. Verbalized acceptance and understanding.  Screening Tests Health Maintenance  Topic Date Due   COVID-19 Vaccine (4 - 2024-25 season) 08/03/2023 (Originally 03/11/2023)   DTaP/Tdap/Td (2 - Tdap) 04/25/2024 (Originally 06/18/2019)   Medicare Annual Wellness (AWV)  07/17/2024   Pneumonia Vaccine 61+ Years old  Completed   INFLUENZA VACCINE  Completed   HPV VACCINES  Aged Out   Hepatitis C Screening  Discontinued   Zoster Vaccines- Shingrix   Discontinued    Health Maintenance  There are no preventive care reminders to display for this patient.   Colorectal cancer screening: No longer required.   Lung Cancer Screening: (Low Dose CT Chest recommended if Age 59-80 years, 20 pack-year currently smoking OR have quit w/in 15years.) does not qualify.   Lung Cancer Screening Referral:   Additional Screening:  Hepatitis C Screening:  qualify; Completed  2017  Vision Screening: Recommended annual ophthalmology exams for early detection of glaucoma and other disorders of the eye. Is the patient up to date with their annual eye exam?  Yes  Who is the provider or what is the name of the office in which the patient attends annual eye exams? [facility] If pt is not established with a provider, would they like to be referred to a provider to establish care? No .   Dental Screening: Recommended annual dental exams for proper oral hygiene    Community Resource Referral / Chronic Care Management: CRR required this visit?  No   CCM required this visit?  No     Plan:     I have personally reviewed and noted the following in the patient's chart:   Medical and social history Use of alcohol, tobacco or illicit drugs  Current medications and supplements  including opioid prescriptions. Patient is not currently taking opioid prescriptions. Functional ability and status Nutritional status Physical activity Advanced directives List of other physicians Hospitalizations, surgeries, and ER visits in previous 12 months Vitals Screenings to include cognitive, depression, and falls Referrals and appointments  In addition, I have reviewed and discussed with patient certain preventive protocols, quality metrics, and best practice recommendations. A written personalized care plan for preventive services as well as general preventive health recommendations were provided to patient.     Kimberly Coye, LPN  07/18/2023   After Visit Summary: (MyChart) Due to this being a telephonic visit, the after visit summary with patients personalized plan was offered to patient via MyChart   Nurse Notes:

## 2023-07-18 NOTE — Patient Instructions (Signed)
 Gregory Blevins , Thank you for taking time to come for your Medicare Wellness Visit. I appreciate your ongoing commitment to your health goals. Please review the following plan we discussed and let me know if I can assist you in the future.   Screening recommendations/referrals: Colonoscopy: no longer needed Recommended yearly ophthalmology/optometry visit for glaucoma screening and checkup Recommended yearly dental visit for hygiene and checkup  Vaccinations: Influenza vaccine: up to date Pneumococcal vaccine: up to date Tdap vaccine: Education provided Shingles vaccine: Education provided    Advanced directives: yes not on file    Preventive Care 83 Years and Older, Male Preventive care refers to lifestyle choices and visits with your health care provider that can promote health and wellness. What does preventive care include? A yearly physical exam. This is also called an annual well check. Dental exams once or twice a year. Routine eye exams. Ask your health care provider how often you should have your eyes checked. Personal lifestyle choices, including: Daily care of your teeth and gums. Regular physical activity. Eating a healthy diet. Avoiding tobacco and drug use. Limiting alcohol use. Practicing safe sex. Taking low doses of aspirin  every day. Taking vitamin and mineral supplements as recommended by your health care provider. What happens during an annual well check? The services and screenings done by your health care provider during your annual well check will depend on your age, overall health, lifestyle risk factors, and family history of disease. Counseling  Your health care provider may ask you questions about your: Alcohol use. Tobacco use. Drug use. Emotional well-being. Home and relationship well-being. Sexual activity. Eating habits. History of falls. Memory and ability to understand (cognition). Work and work astronomer. Screening  You may have the  following tests or measurements: Height, weight, and BMI. Blood pressure. Lipid and cholesterol levels. These may be checked every 5 years, or more frequently if you are over 83 years old. Skin check. Lung cancer screening. You may have this screening every year starting at age 83 if you have a 30-pack-year history of smoking and currently smoke or have quit within the past 15 years. Fecal occult blood test (FOBT) of the stool. You may have this test every year starting at age 83. Flexible sigmoidoscopy or colonoscopy. You may have a sigmoidoscopy every 5 years or a colonoscopy every 10 years starting at age 83. Prostate cancer screening. Recommendations will vary depending on your family history and other risks. Hepatitis C blood test. Hepatitis B blood test. Sexually transmitted disease (STD) testing. Diabetes screening. This is done by checking your blood sugar (glucose) after you have not eaten for a while (fasting). You may have this done every 1-3 years. Abdominal aortic aneurysm (AAA) screening. You may need this if you are a current or former smoker. Osteoporosis. You may be screened starting at age 83 if you are at high risk. Talk with your health care provider about your test results, treatment options, and if necessary, the need for more tests. Vaccines  Your health care provider may recommend certain vaccines, such as: Influenza vaccine. This is recommended every year. Tetanus, diphtheria, and acellular pertussis (Tdap, Td) vaccine. You may need a Td booster every 10 years. Zoster vaccine. You may need this after age 17. Pneumococcal 13-valent conjugate (PCV13) vaccine. One dose is recommended after age 83. Pneumococcal polysaccharide (PPSV23) vaccine. One dose is recommended after age 83. Talk to your health care provider about which screenings and vaccines you need and how often you need them.  This information is not intended to replace advice given to you by your health care  provider. Make sure you discuss any questions you have with your health care provider. Document Released: 07/23/2015 Document Revised: 03/15/2016 Document Reviewed: 04/27/2015 Elsevier Interactive Patient Education  2017 [employer].  Fall Prevention in the Home Falls can cause injuries. They can happen to people of all ages. There are many things you can do to make your home safe and to help prevent falls. What can I do on the outside of my home? Regularly fix the edges of walkways and driveways and fix any cracks. Remove anything that might make you trip as you walk through a door, such as a raised step or threshold. Trim any bushes or trees on the path to your home. Use bright outdoor lighting. Clear any walking paths of anything that might make someone trip, such as rocks or tools. Regularly check to see if handrails are loose or broken. Make sure that both sides of any steps have handrails. Any raised decks and porches should have guardrails on the edges. Have any leaves, snow, or ice cleared regularly. Use sand or salt on walking paths during winter. Clean up any spills in your garage right away. This includes oil or grease spills. What can I do in the bathroom? Use night lights. Install grab bars by the toilet and in the tub and shower. Do not use towel bars as grab bars. Use non-skid mats or decals in the tub or shower. If you need to sit down in the shower, use a plastic, non-slip stool. Keep the floor dry. Clean up any water  that spills on the floor as soon as it happens. Remove soap buildup in the tub or shower regularly. Attach bath mats securely with double-sided non-slip rug tape. Do not have throw rugs and other things on the floor that can make you trip. What can I do in the bedroom? Use night lights. Make sure that you have a light by your bed that is easy to reach. Do not use any sheets or blankets that are too big for your bed. They should not hang down onto the  floor. Have a firm chair that has side arms. You can use this for support while you get dressed. Do not have throw rugs and other things on the floor that can make you trip. What can I do in the kitchen? Clean up any spills right away. Avoid walking on wet floors. Keep items that you use a lot in easy-to-reach places. If you need to reach something above you, use a strong step stool that has a grab bar. Keep electrical cords out of the way. Do not use floor polish or wax that makes floors slippery. If you must use wax, use non-skid floor wax. Do not have throw rugs and other things on the floor that can make you trip. What can I do with my stairs? Do not leave any items on the stairs. Make sure that there are handrails on both sides of the stairs and use them. Fix handrails that are broken or loose. Make sure that handrails are as long as the stairways. Check any carpeting to make sure that it is firmly attached to the stairs. Fix any carpet that is loose or worn. Avoid having throw rugs at the top or bottom of the stairs. If you do have throw rugs, attach them to the floor with carpet tape. Make sure that you have a light switch at the  top of the stairs and the bottom of the stairs. If you do not have them, ask someone to add them for you. What else can I do to help prevent falls? Wear shoes that: Do not have high heels. Have rubber bottoms. Are comfortable and fit you well. Are closed at the toe. Do not wear sandals. If you use a stepladder: Make sure that it is fully opened. Do not climb a closed stepladder. Make sure that both sides of the stepladder are locked into place. Ask someone to hold it for you, if possible. Clearly mark and make sure that you can see: Any grab bars or handrails. First and last steps. Where the edge of each step is. Use tools that help you move around (mobility aids) if they are needed. These include: Canes. Walkers. Scooters. Crutches. Turn on the  lights when you go into a dark area. Replace any light bulbs as soon as they burn out. Set up your furniture so you have a clear path. Avoid moving your furniture around. If any of your floors are uneven, fix them. If there are any pets around you, be aware of where they are. Review your medicines with your doctor. Some medicines can make you feel dizzy. This can increase your chance of falling. Ask your doctor what other things that you can do to help prevent falls. This information is not intended to replace advice given to you by your health care provider. Make sure you discuss any questions you have with your health care provider. Document Released: 04/22/2009 Document Revised: 12/02/2015 Document Reviewed: 07/31/2014 Elsevier Interactive Patient Education  2017 [employer].

## 2023-09-05 DIAGNOSIS — C44229 Squamous cell carcinoma of skin of left ear and external auricular canal: Secondary | ICD-10-CM | POA: Diagnosis not present

## 2023-09-05 DIAGNOSIS — R229 Localized swelling, mass and lump, unspecified: Secondary | ICD-10-CM | POA: Diagnosis not present

## 2023-09-05 DIAGNOSIS — Z85828 Personal history of other malignant neoplasm of skin: Secondary | ICD-10-CM | POA: Diagnosis not present

## 2023-09-05 DIAGNOSIS — D229 Melanocytic nevi, unspecified: Secondary | ICD-10-CM | POA: Diagnosis not present

## 2023-09-05 DIAGNOSIS — C4442 Squamous cell carcinoma of skin of scalp and neck: Secondary | ICD-10-CM | POA: Diagnosis not present

## 2023-09-05 DIAGNOSIS — D485 Neoplasm of uncertain behavior of skin: Secondary | ICD-10-CM | POA: Diagnosis not present

## 2023-09-05 DIAGNOSIS — Z08 Encounter for follow-up examination after completed treatment for malignant neoplasm: Secondary | ICD-10-CM | POA: Diagnosis not present

## 2023-09-05 DIAGNOSIS — D225 Melanocytic nevi of trunk: Secondary | ICD-10-CM | POA: Diagnosis not present

## 2023-09-05 DIAGNOSIS — L820 Inflamed seborrheic keratosis: Secondary | ICD-10-CM | POA: Diagnosis not present

## 2023-09-10 ENCOUNTER — Ambulatory Visit: Payer: Medicare Other | Admitting: Podiatry

## 2023-09-14 ENCOUNTER — Ambulatory Visit (INDEPENDENT_AMBULATORY_CARE_PROVIDER_SITE_OTHER): Payer: Medicare Other | Admitting: Podiatry

## 2023-09-14 ENCOUNTER — Encounter: Payer: Self-pay | Admitting: Podiatry

## 2023-09-14 DIAGNOSIS — M79675 Pain in left toe(s): Secondary | ICD-10-CM

## 2023-09-14 DIAGNOSIS — B351 Tinea unguium: Secondary | ICD-10-CM | POA: Diagnosis not present

## 2023-09-14 DIAGNOSIS — I739 Peripheral vascular disease, unspecified: Secondary | ICD-10-CM | POA: Diagnosis not present

## 2023-09-14 DIAGNOSIS — M79674 Pain in right toe(s): Secondary | ICD-10-CM | POA: Diagnosis not present

## 2023-09-14 MED ORDER — CLN HAND & FOOT WASH EX LIQD: 1.0000 | Freq: Every day | CUTANEOUS | 5 refills | Status: AC

## 2023-09-14 NOTE — Progress Notes (Signed)
  Subjective:  Patient ID: Gregory Blevins, male    DOB: Oct 16, 1940,   MRN: [id number]  No chief complaint on file.   83 y.o. male presents for concern of thickened elongated and painful nails that are difficult to trim. Requesting to have them trimmed today. He has a history of PAD and at risk for foot care.   PCP:  Jeoffrey Massed, MD    . Denies any other pedal complaints. Denies n/v/f/c.   Past Medical History:  Diagnosis Date   Chronic renal insufficiency, stage 3 (moderate) (HCC)    Diverticulosis 2013   noted on colonoscopy   Duodenal ulcer    Esophagitis    Fatty liver 05/2012   u/s done for mild/persistent elevation of LFTs   GI bleed    Hearing impairment    11/2015 Audiology eval= not yet ready for hearing aids ([facility])   History of adenomatous polyp of colon    On multiple colonoscopies: most recent colonoscopy 05/31/18-->adenoma x 2, repeat 3-5 ys is optional.   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   Microhematuria 09/2017   On dipstick only.  Urine micro 09/2017 and 04/2018-->no RBCs.   OAB (overactive bladder)    ditropan xl 10 ineffective--pt d/c'd this 09/2018   Obesity    Osteoarthritis    Hips (L>R on x-ray 04/2013), shoulders, and knees (hx of adhesive capsulitis of shoulder)   Osteoarthritis of left ankle and foot    Prediabetes    A1c 6.1% 08/2019 at [facility]   Rectal leakage    UGI bleed 10/26/2021   duodenal ulcer-->transfused 2 U pRBCs    Objective:  Physical Exam: Vascular: DP/PT pulses 2/4 bilateral. CFT <3 seconds. Normal hair growth on digits. No edema.  Skin. No lacerations or abrasions bilateral feet. Nails 1-5 bilateral are thickened dystrophic and with subungual debris.  Musculoskeletal: MMT 5/5 bilateral lower extremities in DF, PF, Inversion and Eversion. Deceased ROM in  DF of ankle joint.  Neurological: Sensation intact to light touch.   Assessment:   1. Pain due to onychomycosis of toenails of both feet   2. PAD (peripheral artery disease) (HCC)      Plan:  Patient was evaluated and treated and all questions answered..  -Mechanically debrided all nails 1-5 bilateral using sterile nail nipper and filed with dremel without incident  -Soap cleanser sent to pharmacy at patient request.  -Answered all patient questions -Patient to return  in 3 months for at risk foot care -Patient advised to call the office if any problems or questions arise in the meantime.   Louann Sjogren, DPM

## 2023-10-01 DIAGNOSIS — C44229 Squamous cell carcinoma of skin of left ear and external auricular canal: Secondary | ICD-10-CM | POA: Diagnosis not present

## 2023-10-15 ENCOUNTER — Ambulatory Visit (INDEPENDENT_AMBULATORY_CARE_PROVIDER_SITE_OTHER): Payer: Medicare Other | Admitting: Family Medicine

## 2023-10-15 ENCOUNTER — Encounter: Payer: Self-pay | Admitting: Family Medicine

## 2023-10-15 VITALS — BP 124/60 | HR 58 | Ht 67.0 in | Wt 225.4 lb

## 2023-10-15 DIAGNOSIS — E875 Hyperkalemia: Secondary | ICD-10-CM

## 2023-10-15 DIAGNOSIS — I10 Essential (primary) hypertension: Secondary | ICD-10-CM | POA: Diagnosis not present

## 2023-10-15 DIAGNOSIS — E7800 Pure hypercholesterolemia, unspecified: Secondary | ICD-10-CM | POA: Diagnosis not present

## 2023-10-15 DIAGNOSIS — R7303 Prediabetes: Secondary | ICD-10-CM | POA: Diagnosis not present

## 2023-10-15 LAB — LIPID PANEL
Cholesterol: 116 mg/dL (ref 0–200)
HDL: 36.9 mg/dL — ABNORMAL LOW (ref >39.00)
LDL Cholesterol: 57 mg/dL (ref 0–99)
NonHDL: 79.56
Total CHOL/HDL Ratio: 3
Triglycerides: 111 mg/dL (ref 0.0–149.0)
VLDL: 22.2 mg/dL (ref 0.0–40.0)

## 2023-10-15 LAB — BASIC METABOLIC PANEL WITH GFR
BUN: 33 mg/dL — ABNORMAL HIGH (ref 6–23)
CO2: 28 meq/L (ref 19–32)
Calcium: 9.4 mg/dL (ref 8.4–10.5)
Chloride: 105 meq/L (ref 96–112)
Creatinine, Ser: 1.4 mg/dL (ref 0.40–1.50)
GFR: 46.81 mL/min — ABNORMAL LOW (ref >60.00)
Glucose, Bld: 83 mg/dL (ref 70–99)
Potassium: 5.1 meq/L (ref 3.5–5.1)
Sodium: 140 meq/L (ref 135–145)

## 2023-10-15 LAB — HEMOGLOBIN A1C: Hgb A1c MFr Bld: 5.9 % (ref 4.6–6.5)

## 2023-10-15 MED ORDER — LISINOPRIL 30 MG PO TABS: ORAL_TABLET | ORAL | 3 refills | Status: DC

## 2023-10-15 NOTE — Progress Notes (Signed)
 OFFICE VISIT  10/15/2023  CC:  Chief Complaint  Patient presents with   Medical Management of Chronic Issues    Pt is not fasting    Patient is a 83 y.o. male who presents for 24-month follow-up hypercholesterolemia, hypertension, and prediabetes.  INTERIM HX: Gregory Blevins is feeling well. He had a tree fall down in his yard a couple days ago and he and his grandson cut it up yesterday. He did well with this. He has not had any lower extremity swelling lately.  No use of Lasix lately.  ROS as above, plus--> no fevers, no CP, no SOB, no wheezing, no cough, no dizziness, no HAs, no rashes, no melena/hematochezia.  No polyuria or polydipsia.  No focal weakness, paresthesias, or tremors.  No acute vision or hearing abnormalities.  No dysuria or unusual/new urinary urgency or frequency.  No recent changes in lower legs. No n/v/d or abd pain.  No palpitations.    Past Medical History:  Diagnosis Date   Bilateral lower extremity edema    Chronic renal insufficiency, stage 3 (moderate) (HCC)    Diverticulosis 2013   noted on colonoscopy   Duodenal ulcer    Esophagitis    Fatty liver 05/2012   u/s done for mild/persistent elevation of LFTs   GI bleed    Hearing impairment    11/2015 Audiology eval= not yet ready for hearing aids ([facility])   History of adenomatous polyp of colon    On multiple colonoscopies: most recent colonoscopy 05/31/18-->adenoma x 2, repeat 3-5 ys is optional.   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   Microhematuria 09/2017   On dipstick only.  Urine micro 09/2017 and 04/2018-->no RBCs.   OAB (overactive bladder)    ditropan xl 10 ineffective--pt d/c'd this 09/2018   Obesity    Osteoarthritis    Hips (L>R on x-ray 04/2013), shoulders, and knees (hx of adhesive capsulitis of shoulder)   Osteoarthritis of left ankle  and foot    Prediabetes    A1c 6.1% 08/2019 [facility]   Rectal leakage    UGI bleed 10/26/2021   duodenal ulcer-->transfused 2 U pRBCs    Past Surgical History:  Procedure Laterality Date   BIOPSY  10/27/2021   Procedure: BIOPSY;  Surgeon: Tressia Danas, MD;  Location: [facility] ENDOSCOPY;  Service: Gastroenterology;;   COLONOSCOPY     2008 w/polyps,  09-22-2011 w/polyps.  01/2015 tubular adenoma x 1.  05/31/18 colonoscopy w/ two tubular adenomas.   COLONOSCOPY W/ POLYPECTOMY  2008; 09/22/11;01/2015   Tubular adenoma--recall 3 yrs per Dr. Tobin Chad at [facility].  Then switched to Dr. Rhea Belton with [place].   ELBOW SURGERY     Left ulnar decompression 2024   ESOPHAGOGASTRODUODENOSCOPY  10/27/2021   duodenal ulcer, h pylori NEG   ESOPHAGOGASTRODUODENOSCOPY (EGD) WITH PROPOFOL N/A 10/27/2021   Procedure: ESOPHAGOGASTRODUODENOSCOPY (EGD) WITH PROPOFOL;  Surgeon: Tressia Danas, MD;  Location: [facility] ENDOSCOPY;  Service: Gastroenterology;  Laterality: N/A;   POLYPECTOMY     REVERSE SHOULDER ARTHROPLASTY Left 10/17/2021   Procedure: LEFT REVERSE SHOULDER ARTHROPLASTY;  Surgeon: Cammy Copa, MD;  Location: [facility] OR;  Service: Orthopedics;  Laterality: Left;   TONSILLECTOMY     TRANSTHORACIC ECHOCARDIOGRAM  10/27/2021   10/2021: grd I DD, o/w normal   UMBILICAL HERNIA REPAIR  2011   WISDOM TOOTH EXTRACTION Bilateral     Outpatient Medications Prior to Visit  Medication Sig Dispense Refill   atorvastatin (LIPITOR) 20 MG tablet Take 1 tablet (20 mg total) by mouth daily. 90 tablet 1   cholecalciferol (VITAMIN D) 25 MCG (1000 UNIT) tablet Take 1,000 Units by mouth daily.     diclofenac Sodium (VOLTAREN) 1 % GEL Apply 2 g topically 4 (four) times daily as needed. 100 g 0   meclizine (ANTIVERT) 25 MG tablet Take 1-2 tablets (25-50 mg total) by mouth 3 (three) times daily as needed for dizziness. 30 tablet 1   Soap & Cleansers (CLN HAND & FOOT WASH) LIQD Apply 1 Dose topically daily.  240 mL 5   lisinopril (ZESTRIL) 30 MG tablet Take 0.5 tablets (15 mg total) by mouth daily. TAKE 1 TABLET BY MOUTH EVERY DAY     acetaminophen (TYLENOL) 500 MG tablet Take 500-1,000 mg by mouth in the morning and at bedtime. (Patient not taking: Reported on 10/15/2023)     furosemide (LASIX) 20 MG tablet 1 tab po every other day (Patient not taking: Reported on 10/15/2023) 45 tablet 3   gabapentin (NEURONTIN) 100 MG capsule TAKE 1 CAPSULE (100 MG TOTAL) BY MOUTH THREE TIMES DAILY. (Patient not taking: Reported on 04/16/2023) 90 capsule 0   pantoprazole (PROTONIX) 40 MG tablet Take 1 tablet (40 mg total) by mouth 2 (two) times daily before a meal. (Patient not taking: Reported on 04/16/2023) 60 tablet 3   senna (SENOKOT) 8.6 MG TABS tablet Take 1 tablet by mouth daily as needed for mild constipation or moderate constipation. (Patient not taking: Reported on 04/16/2023)     No facility-administered medications prior to visit.    No Known Allergies  Review of Systems As per HPI  PE:    10/15/2023    8:48 AM 04/26/2023    1:46 PM 04/16/2023    8:22 AM  Vitals with BMI  Height 5\' 7"     Weight 225 lbs 6 oz 225 lbs 3 oz   BMI 35.29    Systolic 124 128 161  Diastolic 60 73 56  Pulse 58 67      Physical Exam  Gen: Alert, well appearing.  Patient is oriented to person, place, time, and situation. AFFECT: pleasant, lucid thought and speech. CV: RRR, no m/r/g.   LUNGS: CTA bilat, nonlabored resps, good aeration in all lung fields. Extremities: He has 1+ pitting edema on the right lower leg and trace pitting edema on the left lower leg.  LABS:  Last CBC Lab Results  Component Value Date   WBC 10.4 04/13/2022   HGB 14.1 04/13/2022   HCT 42.8 04/13/2022   MCV 88.5 04/13/2022   MCH 28.9 04/09/2022   RDW 14.8 04/13/2022   PLT 183.0 04/13/2022   Last metabolic panel Lab Results  Component Value Date   GLUCOSE 102 (H) 04/26/2023   NA 138 04/26/2023   K 4.7 04/26/2023   CL 103 04/26/2023    CO2 26 04/26/2023   BUN 29 (H) 04/26/2023   CREATININE 1.11 04/26/2023   GFR 62.05 04/26/2023   CALCIUM 9.0 04/26/2023   PHOS 2.9 10/27/2021   PROT 6.9 04/16/2023   ALBUMIN 4.2 04/16/2023   BILITOT 0.7 04/16/2023   ALKPHOS 96 04/16/2023   AST 21 04/16/2023   ALT 26 04/16/2023   ANIONGAP 7 04/09/2022   Last lipids Lab Results  Component Value Date   CHOL 133 04/16/2023   HDL 45.60 04/16/2023  LDLCALC 63 04/16/2023   LDLDIRECT 63.0 10/13/2022   TRIG 118.0 04/16/2023   CHOLHDL 3 04/16/2023   Last hemoglobin A1c Lab Results  Component Value Date   HGBA1C 5.8 04/16/2023   IMPRESSION AND PLAN:  #1 hypertension, well-controlled on lisinopril one half of a 30 mg tab daily.  2.  Hyperkalemia. Potassium normalized after we decreased his lisinopril from 30 mg to 15 mg today. Monitor potassium level today.  #3 hypercholesterolemia, has been well-controlled on atorvastatin 20 mg a day. He is not fasting today. Check lipid panel today.  4.  Prediabetes. Monitor hemoglobin A1c today.  An After Visit Summary was printed and given to the patient.  FOLLOW UP: Return in about 6 months (around 04/15/2024) for routine chronic illness f/u.  Signed:  Santiago Bumpers, MD           10/15/2023

## 2023-10-16 ENCOUNTER — Encounter: Payer: Self-pay | Admitting: Family Medicine

## 2023-10-20 ENCOUNTER — Other Ambulatory Visit: Payer: Self-pay | Admitting: Family Medicine

## 2023-10-21 ENCOUNTER — Other Ambulatory Visit: Payer: Self-pay | Admitting: Family Medicine

## 2023-10-29 ENCOUNTER — Ambulatory Visit (INDEPENDENT_AMBULATORY_CARE_PROVIDER_SITE_OTHER): Admitting: Urgent Care

## 2023-10-29 ENCOUNTER — Encounter: Payer: Self-pay | Admitting: Urgent Care

## 2023-10-29 VITALS — BP 124/70 | HR 65 | Wt 226.4 lb

## 2023-10-29 DIAGNOSIS — L989 Disorder of the skin and subcutaneous tissue, unspecified: Secondary | ICD-10-CM | POA: Diagnosis not present

## 2023-10-29 MED ORDER — MUPIROCIN 2 % EX OINT: 1.0000 | TOPICAL_OINTMENT | Freq: Three times a day (TID) | CUTANEOUS | 0 refills | Status: AC

## 2023-10-29 NOTE — Patient Instructions (Addendum)
 Please apply topical mupirocin  to the affected area of your left hand three times daily for up to one week. We will get in contact with your dermatologist and reach out with the recommendations.  Please contact [facility] at [phone]. They will need to see you to perform a biopsy of the lesion before your previous dermatologist can schedule. Please let me know if you have any questions.

## 2023-10-29 NOTE — Progress Notes (Signed)
 Established Patient Office Visit  Subjective:  Patient ID: Gregory Blevins, male    DOB: [birth year]  Age: 83 y.o. MRN: [id number]  Chief Complaint  Patient presents with   Rash    Pt has a spot on his left had that he wants to make sure its not infected. The area has been there for about a week and it does hurt when he presses it.    Rash    Discussed the use of AI scribe software for clinical note transcription with the patient, who gave verbal consent to proceed.  History of Present Illness   Gregory DIVIS "[place]" is an 83 year old male with a history of basal cell carcinoma who presents with concerns of a possible hand infection.  He noticed a lesion on his hand approximately two to three weeks ago. Initially small, it has since grown slightly. The lesion is painful only when touched, with pain localized to the center. No preceding injury to the area. He initially suspected a tick bite, though his wife disagreed. He has been picking at the lesion, which flakes off slightly.  He has a history of basal cell carcinoma, previously treated on his face and ear, with the most recent surgery completed a few weeks ago. He follows up with dermatology annually.  He has been using a clear ointment prescribed by his dermatologist, though he does not recall the name. He confirms it is not Voltaren  gel, which he uses for his feet.       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/19/2021   Prostate cancer screening 10/21/2015   Preventative health care 04/10/2014   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/22/2013   Facet arthropathy, lumbar 04/22/2013   Pelvic pain in male 03/21/2013   Health maintenance examination 09/27/2012   HTN (hypertension), benign  09/27/2012   Basal cell carcinoma of skin 07/12/2011   Colon polyp 07/12/2011   High risk medication use 07/12/2011   Elevation of level of transaminase or lactic acid dehydrogenase (LDH) 08/31/2010   Onychomycosis due to dermatophyte 08/31/2010   Osteoarthrosis involving lower leg 08/31/2010   Other and unspecified hyperlipidemia 08/31/2010   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   Myalgia and myositis, unspecified 10/19/2009   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   Morbid obesity (HCC) 06/17/2009   Night blindness 06/17/2009   Past Medical History:  Diagnosis Date   Bilateral lower extremity edema    Chronic renal insufficiency, stage 3 (moderate) (HCC)    Diverticulosis 2013   noted on colonoscopy   Duodenal ulcer    Esophagitis    Fatty liver 05/2012   u/s done for mild/persistent elevation of LFTs   GI bleed    Hearing impairment    11/2015 Audiology eval= not yet ready for hearing aids ([facility])   History of adenomatous polyp of colon    On multiple colonoscopies: most recent colonoscopy 05/31/18-->adenoma x 2, repeat 3-5 ys is optional.   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   Microhematuria 09/2017   On dipstick only.  Urine micro 09/2017 and 04/2018-->no RBCs.   OAB (overactive bladder)    ditropan  xl 10 ineffective--pt d/c'd this 09/2018   Obesity    Osteoarthritis    Hips (L>R on x-ray 04/2013), shoulders, and knees (hx of adhesive capsulitis of shoulder)   Osteoarthritis of left ankle and foot    Prediabetes    A1c 6.1% 08/2019 at [facility]   Rectal leakage    UGI bleed 10/26/2021   duodenal ulcer-->transfused 2 U pRBCs   Past Surgical History:  Procedure Laterality Date   BIOPSY  10/27/2021   Procedure: BIOPSY;  Surgeon: Lindle Rhea, MD;  Location: [facility] ENDOSCOPY;  Service: Gastroenterology;;    COLONOSCOPY     2008 w/polyps,  09-22-2011 w/polyps.  01/2015 tubular adenoma x 1.  05/31/18 colonoscopy w/ two tubular adenomas.   COLONOSCOPY W/ POLYPECTOMY  2008; 09/22/11;01/2015   Tubular adenoma--recall 3 yrs per Dr. Georgiann Kirsch at [facility].  Then switched to Dr. Bridgett Camps with [facility].   ELBOW SURGERY     Left ulnar decompression 2024   ESOPHAGOGASTRODUODENOSCOPY  10/27/2021   duodenal ulcer, h pylori NEG   ESOPHAGOGASTRODUODENOSCOPY (EGD) WITH PROPOFOL  N/A 10/27/2021   Procedure: ESOPHAGOGASTRODUODENOSCOPY (EGD) WITH PROPOFOL ;  Surgeon: Lindle Rhea, MD;  Location: [facility] ENDOSCOPY;  Service: Gastroenterology;  Laterality: N/A;   POLYPECTOMY     REVERSE SHOULDER ARTHROPLASTY Left 10/17/2021   Procedure: LEFT REVERSE SHOULDER ARTHROPLASTY;  Surgeon: Jasmine Mesi, MD;  Location: [facility] OR;  Service: Orthopedics;  Laterality: Left;   TONSILLECTOMY     TRANSTHORACIC ECHOCARDIOGRAM  10/27/2021   10/2021: grd I DD, o/w normal   UMBILICAL HERNIA REPAIR  2011   WISDOM TOOTH EXTRACTION Bilateral    Social History   Tobacco Use   Smoking status: Never   Smokeless tobacco: Never  Vaping Use   Vaping status: Never Used  Substance Use Topics   Alcohol use: Yes    Alcohol/week: 1.0 standard drink of alcohol    Types: 1 Cans of beer per week    Comment: occassional   Drug use: No      ROS: as noted in HPI  Objective:     BP 124/70   Pulse 65   Wt 226 lb 6.4 oz (102.7 kg)   SpO2 96%   BMI 35.46 kg/m  BP Readings from Last 3 Encounters:  10/29/23 124/70  10/15/23 124/60  04/26/23 128/73   Wt Readings from Last 3 Encounters:  10/29/23 226 lb 6.4 oz (102.7 kg)  10/15/23 225 lb 6.4 oz (102.2 kg)  04/26/23 225 lb 3.2 oz (102.2 kg)      Physical Exam Vitals and nursing note reviewed.  Constitutional:      General: He is not in acute distress.    Appearance: Normal appearance. He is not ill-appearing, toxic-appearing or diaphoretic.  HENT:     Head:  Normocephalic and atraumatic.     Right Ear: External ear normal.     Left Ear: External ear normal.     Nose: Nose normal.  Eyes:     General: No scleral icterus.    Pupils: Pupils are equal, round, and reactive to light.  Cardiovascular:     Rate and Rhythm: Normal rate.  Pulmonary:     Effort: Pulmonary effort is normal. No respiratory distress.  Skin:    General: Skin is warm and dry.     Findings: Lesion (hyperkeratotic lesion to dorsal left hand with circumferential erythema) present. No erythema or rash.  Neurological:  General: No focal deficit present.     Mental Status: He is alert and oriented to person, place, and time.         No results found for any visits on 10/29/23.  Last CBC Lab Results  Component Value Date   WBC 10.4 04/13/2022   HGB 14.1 04/13/2022   HCT 42.8 04/13/2022   MCV 88.5 04/13/2022   MCH 28.9 04/09/2022   RDW 14.8 04/13/2022   PLT 183.0 04/13/2022   Last metabolic panel Lab Results  Component Value Date   GLUCOSE 83 10/15/2023   NA 140 10/15/2023   K 5.1 10/15/2023   CL 105 10/15/2023   CO2 28 10/15/2023   BUN 33 (H) 10/15/2023   CREATININE 1.40 10/15/2023   GFR 46.81 (L) 10/15/2023   CALCIUM  9.4 10/15/2023   PHOS 2.9 10/27/2021   PROT 6.9 04/16/2023   ALBUMIN 4.2 04/16/2023   BILITOT 0.7 04/16/2023   ALKPHOS 96 04/16/2023   AST 21 04/16/2023   ALT 26 04/16/2023   ANIONGAP 7 04/09/2022      The ASCVD Risk score (Arnett DK, et al., 2019) failed to calculate for the following reasons:   The 2019 ASCVD risk score is only valid for ages 27 to 12  Assessment & Plan:  Skin lesion of hand -     Mupirocin ; Apply 1 Application topically 3 (three) times daily.  Dispense: 22 g; Refill: 0   Assessment and Plan    Hand lesion Lesion possibly enlarging, painful, with red irritation. Does not look actively infected, however pt is picking at area which increases this risk. History of basal cell carcinoma, current lesion  concerning for SCC. - Document lesion with photograph. - Contacted Dr. Nettie Barb office who can no longer schedule pt without a confirmed skin pathology report. Pt was referred to [facility] [phone]) to obtain bx.        No follow-ups on file.   Mandy Second, PA

## 2023-11-08 ENCOUNTER — Other Ambulatory Visit: Payer: Self-pay

## 2023-11-08 MED ORDER — ONDANSETRON 8 MG PO TBDP: 8.0000 mg | ORAL_TABLET | Freq: Three times a day (TID) | ORAL | 1 refills | Status: AC | PRN

## 2023-11-08 NOTE — Telephone Encounter (Signed)
 Copied from CRM [id number]. Topic: Clinical - Prescription Issue >> Nov 07, 2023  4:19 PM Dyann Glaser G wrote: Reason for CRM: PT CALLED REQUEST A REFILL ON THIS MED ondansetron  (ZOFRAN -ODT) 8 MG disintegrating tablet, BUT IT HAS BEEN DISCONTINUED AND DR MOLPUS DID THE RX BACK IN 2023. I DID SCHEDULE HIM AN APPT FOR 05/09   Please fill, if appropriate. Rx pending

## 2023-11-08 NOTE — Telephone Encounter (Signed)
Pt advised rx sent to pharmacy. 

## 2023-11-16 ENCOUNTER — Ambulatory Visit: Payer: PRIVATE HEALTH INSURANCE | Admitting: Family Medicine

## 2023-11-22 DIAGNOSIS — C44629 Squamous cell carcinoma of skin of left upper limb, including shoulder: Secondary | ICD-10-CM | POA: Diagnosis not present

## 2023-11-22 DIAGNOSIS — D485 Neoplasm of uncertain behavior of skin: Secondary | ICD-10-CM | POA: Diagnosis not present

## 2023-12-13 ENCOUNTER — Ambulatory Visit (INDEPENDENT_AMBULATORY_CARE_PROVIDER_SITE_OTHER): Admitting: Podiatry

## 2023-12-13 ENCOUNTER — Encounter: Payer: Self-pay | Admitting: Podiatry

## 2023-12-13 DIAGNOSIS — I739 Peripheral vascular disease, unspecified: Secondary | ICD-10-CM | POA: Diagnosis not present

## 2023-12-13 DIAGNOSIS — M79675 Pain in left toe(s): Secondary | ICD-10-CM

## 2023-12-13 DIAGNOSIS — B351 Tinea unguium: Secondary | ICD-10-CM

## 2023-12-13 DIAGNOSIS — M79674 Pain in right toe(s): Secondary | ICD-10-CM | POA: Diagnosis not present

## 2023-12-13 NOTE — Progress Notes (Signed)
  Subjective:  Patient ID: Gregory Blevins, male    DOB: 1941/04/30,   MRN: [id number]  Chief Complaint  Patient presents with   Nail Problem    "Do my nails."    83 y.o. male presents for concern of thickened elongated and painful nails that are difficult to trim. Requesting to have them trimmed today. He has a history of PAD and at risk for foot care.   PCP:  Shelvia Dick, MD    . Denies any other pedal complaints. Denies n/v/f/c.   Past Medical History:  Diagnosis Date   Bilateral lower extremity edema    Chronic renal insufficiency, stage 3 (moderate) (HCC)    Diverticulosis 2013   noted on colonoscopy   Duodenal ulcer    Esophagitis    Fatty liver 05/2012   u/s done for mild/persistent elevation of LFTs   GI bleed    Hearing impairment    11/2015 Audiology eval= not yet ready for hearing aids ([facility])   History of adenomatous polyp of colon    On multiple colonoscopies: most recent colonoscopy 05/31/18-->adenoma x 2, repeat 3-5 ys is optional.   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   Microhematuria 09/2017   On dipstick only.  Urine micro 09/2017 and 04/2018-->no RBCs.   OAB (overactive bladder)    ditropan  xl 10 ineffective--pt d/c'd this 09/2018   Obesity    Osteoarthritis    Hips (L>R on x-ray 04/2013), shoulders, and knees (hx of adhesive capsulitis of shoulder)   Osteoarthritis of left ankle and foot    Prediabetes    A1c 6.1% 08/2019 at [facility]   Rectal leakage    UGI bleed 10/26/2021   duodenal ulcer-->transfused 2 U pRBCs    Objective:  Physical Exam: Vascular: DP/PT pulses 2/4 bilateral. CFT <3 seconds. Normal hair growth on digits. No edema.  Skin. No lacerations or abrasions bilateral feet. Nails 1-5 bilateral are thickened dystrophic and with subungual debris.  Musculoskeletal: MMT  5/5 bilateral lower extremities in DF, PF, Inversion and Eversion. Deceased ROM in DF of ankle joint.  Neurological: Sensation intact to light touch.   Assessment:   1. Pain due to onychomycosis of toenails of both feet   2. PAD (peripheral artery disease) (HCC)      Plan:  Patient was evaluated and treated and all questions answered..  -Mechanically debrided all nails 1-5 bilateral using sterile nail nipper and filed with dremel without incident   -Answered all patient questions -Patient to return  in 3 months for at risk foot care -Patient advised to call the office if any problems or questions arise in the meantime.   Jennefer Moats, DPM

## 2024-01-08 DIAGNOSIS — C44629 Squamous cell carcinoma of skin of left upper limb, including shoulder: Secondary | ICD-10-CM | POA: Diagnosis not present

## 2024-01-09 DIAGNOSIS — H33323 Round hole, bilateral: Secondary | ICD-10-CM | POA: Diagnosis not present

## 2024-01-09 DIAGNOSIS — H2513 Age-related nuclear cataract, bilateral: Secondary | ICD-10-CM | POA: Diagnosis not present

## 2024-01-09 DIAGNOSIS — H524 Presbyopia: Secondary | ICD-10-CM | POA: Diagnosis not present

## 2024-01-22 DIAGNOSIS — Z48817 Encounter for surgical aftercare following surgery on the skin and subcutaneous tissue: Secondary | ICD-10-CM | POA: Diagnosis not present

## 2024-03-14 DIAGNOSIS — Z48817 Encounter for surgical aftercare following surgery on the skin and subcutaneous tissue: Secondary | ICD-10-CM | POA: Diagnosis not present

## 2024-03-20 ENCOUNTER — Ambulatory Visit (INDEPENDENT_AMBULATORY_CARE_PROVIDER_SITE_OTHER): Admitting: Podiatry

## 2024-03-20 ENCOUNTER — Encounter: Payer: Self-pay | Admitting: Podiatry

## 2024-03-20 DIAGNOSIS — I739 Peripheral vascular disease, unspecified: Secondary | ICD-10-CM

## 2024-03-20 DIAGNOSIS — M79674 Pain in right toe(s): Secondary | ICD-10-CM

## 2024-03-20 DIAGNOSIS — B351 Tinea unguium: Secondary | ICD-10-CM

## 2024-03-20 DIAGNOSIS — M79675 Pain in left toe(s): Secondary | ICD-10-CM

## 2024-03-20 NOTE — Progress Notes (Signed)
  Subjective:  Patient ID: Gregory Blevins, male    DOB: [birth year],   MRN: [id number]  Chief Complaint  Patient presents with   Nail Problem    Check my feet and cut my toenails.    83 y.o. male presents for concern of thickened elongated and painful nails that are difficult to trim. Requesting to have them trimmed today. He has a history of PAD and at risk for foot care.   PCP:  Candise Aleene DEL, MD    . Denies any other pedal complaints. Denies n/v/f/c.   Past Medical History:  Diagnosis Date   Bilateral lower extremity edema    Chronic renal insufficiency, stage 3 (moderate) (HCC)    Diverticulosis 2013   noted on colonoscopy   Duodenal ulcer    Esophagitis    Fatty liver 05/2012   u/s done for mild/persistent elevation of LFTs   GI bleed    Hearing impairment    11/2015 Audiology eval= not yet ready for hearing aids ([facility])   History of adenomatous polyp of colon    On multiple colonoscopies: most recent colonoscopy 05/31/18-->adenoma x 2, repeat 3-5 ys is optional.   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   Microhematuria 09/2017   On dipstick only.  Urine micro 09/2017 and 04/2018-->no RBCs.   OAB (overactive bladder)    ditropan  xl 10 ineffective--pt d/c'd this 09/2018   Obesity    Osteoarthritis    Hips (L>R on x-ray 04/2013), shoulders, and knees (hx of adhesive capsulitis of shoulder)   Osteoarthritis of left ankle and foot    Prediabetes    A1c 6.1% 08/2019 at [facility]   Rectal leakage    UGI bleed 10/26/2021   duodenal ulcer-->transfused 2 U pRBCs    Objective:  Physical Exam: Vascular: DP/PT pulses 1/4 bilateral. CFT <3 seconds. Normal hair growth on digits. No edema.  Skin. No lacerations or abrasions bilateral feet. Nails 1-5 bilateral are thickened dystrophic and with subungual debris.   Musculoskeletal: MMT 5/5 bilateral lower extremities in DF, PF, Inversion and Eversion. Deceased ROM in DF of ankle joint.  Neurological: Sensation intact to light touch.   Assessment:   1. Pain due to onychomycosis of toenails of both feet   2. PAD (peripheral artery disease) (HCC)      Plan:  Patient was evaluated and treated and all questions answered..  -Mechanically debrided all nails 1-5 bilateral using sterile nail nipper and filed with dremel without incident   -Answered all patient questions -Patient to return  in 3 months for at risk foot care -Patient advised to call the office if any problems or questions arise in the meantime.   Asberry Failing, DPM

## 2024-03-24 ENCOUNTER — Other Ambulatory Visit: Payer: Self-pay | Admitting: Family Medicine

## 2024-03-24 MED ORDER — ATORVASTATIN CALCIUM 20 MG PO TABS: 20.0000 mg | ORAL_TABLET | Freq: Every day | ORAL | 0 refills | Status: DC

## 2024-03-24 NOTE — Telephone Encounter (Signed)
 Copied from CRM #[id number]. Topic: Clinical - Medication Refill >> Mar 24, 2024  9:17 AM Rosina BIRCH wrote: Medication: atorvastatin  and diclofenac  Sodium  Has the patient contacted their pharmacy? No (Agent: If no, request that the patient contact the pharmacy for the refill. If patient does not wish to contact the pharmacy document the reason why and proceed with request.) (Agent: If yes, when and what did the pharmacy advise?)  This is the patient's preferred pharmacy:  [id number] - [place], [place] - [address] [place] [address] [place] [address] [place] [address] Phone: [phone] Fax: [phone]  Is this the correct pharmacy for this prescription? Yes If no, delete pharmacy and type the correct one.   Has the prescription been filled recently? No  Is the patient out of the medication? Yes  Has the patient been seen for an appointment in the last year OR does the patient have an upcoming appointment? Yes  Can we respond through MyChart? Yes  Agent: Please be advised that Rx refills may take up to 3 business days. We ask that you follow-up with your pharmacy.

## 2024-03-28 ENCOUNTER — Other Ambulatory Visit: Payer: Self-pay

## 2024-03-28 ENCOUNTER — Other Ambulatory Visit: Payer: Self-pay | Admitting: Family Medicine

## 2024-03-28 MED ORDER — ATORVASTATIN CALCIUM 20 MG PO TABS: 20.0000 mg | ORAL_TABLET | Freq: Every day | ORAL | 3 refills | Status: DC

## 2024-03-28 MED ORDER — LISINOPRIL 30 MG PO TABS: ORAL_TABLET | ORAL | 1 refills | Status: DC

## 2024-03-28 MED ORDER — DICLOFENAC SODIUM 1 % EX GEL: 2.0000 g | Freq: Four times a day (QID) | CUTANEOUS | 1 refills | Status: DC | PRN

## 2024-03-28 MED ORDER — ATORVASTATIN CALCIUM 20 MG PO TABS: 20.0000 mg | ORAL_TABLET | Freq: Every day | ORAL | 3 refills | Status: AC

## 2024-03-28 MED ORDER — DICLOFENAC SODIUM 1 % EX GEL: 2.0000 g | Freq: Four times a day (QID) | CUTANEOUS | 1 refills | Status: AC | PRN

## 2024-03-28 MED ORDER — LISINOPRIL 30 MG PO TABS: ORAL_TABLET | ORAL | 1 refills | Status: AC

## 2024-03-28 NOTE — Addendum Note (Signed)
 Addended by: FLETA CARE D on: 03/28/2024 03:13 PM   Modules accepted: Orders

## 2024-03-28 NOTE — Telephone Encounter (Signed)
 Copied from CRM [id number]. Topic: Clinical - Medication Refill >> Mar 28, 2024 12:30 PM Robinson H wrote: Medication: lisinopril  (ZESTRIL ) 30 MG tablet  Has the patient contacted their pharmacy? No, checking on another refill (Agent: If no, request that the patient contact the pharmacy for the refill. If patient does not wish to contact the pharmacy document the reason why and proceed with request.) (Agent: If yes, when and what did the pharmacy advise?)  This is the patient's preferred pharmacy:  [id number] - [place], [place] - [address] [place] [address] [place] [address] [place] [address] Phone: [phone] Fax: [phone]   Is this the correct pharmacy for this prescription? Yes If no, delete pharmacy and type the correct one.   Has the prescription been filled recently? No  Is the patient out of the medication? No  Has the patient been seen for an appointment in the last year OR does the patient have an upcoming appointment? Yes  Can we respond through MyChart? Yes  Agent: Please be advised that Rx refills may take up to 3 business days. We ask that you follow-up with your pharmacy.

## 2024-03-28 NOTE — Telephone Encounter (Signed)
 Please fill, if appropriate. Rx pending

## 2024-03-28 NOTE — Telephone Encounter (Signed)
 Copied from CRM [id number]. Topic: Clinical - Prescription Issue >> Mar 28, 2024 12:32 PM Robinson H wrote: Reason for CRM: Patient checking the status of refill request for his atorvastatin  and diclofenac  Sodium submitted on 9/15.  Dusten [phone]

## 2024-04-15 ENCOUNTER — Ambulatory Visit: Payer: PRIVATE HEALTH INSURANCE | Admitting: Family Medicine

## 2024-04-24 ENCOUNTER — Ambulatory Visit (INDEPENDENT_AMBULATORY_CARE_PROVIDER_SITE_OTHER): Payer: PRIVATE HEALTH INSURANCE | Admitting: Family Medicine

## 2024-04-24 ENCOUNTER — Encounter: Payer: Self-pay | Admitting: Family Medicine

## 2024-04-24 VITALS — BP 110/64 | HR 62 | Temp 98.5°F | Ht 67.0 in | Wt 218.4 lb

## 2024-04-24 DIAGNOSIS — I10 Essential (primary) hypertension: Secondary | ICD-10-CM

## 2024-04-24 DIAGNOSIS — M79661 Pain in right lower leg: Secondary | ICD-10-CM | POA: Diagnosis not present

## 2024-04-24 DIAGNOSIS — E7800 Pure hypercholesterolemia, unspecified: Secondary | ICD-10-CM | POA: Diagnosis not present

## 2024-04-24 DIAGNOSIS — M79662 Pain in left lower leg: Secondary | ICD-10-CM

## 2024-04-24 DIAGNOSIS — N2889 Other specified disorders of kidney and ureter: Secondary | ICD-10-CM

## 2024-04-24 DIAGNOSIS — X503XXA Overexertion from repetitive movements, initial encounter: Secondary | ICD-10-CM

## 2024-04-24 DIAGNOSIS — Z23 Encounter for immunization: Secondary | ICD-10-CM | POA: Diagnosis not present

## 2024-04-24 DIAGNOSIS — R7303 Prediabetes: Secondary | ICD-10-CM

## 2024-04-24 DIAGNOSIS — S86919A Strain of unspecified muscle(s) and tendon(s) at lower leg level, unspecified leg, initial encounter: Secondary | ICD-10-CM

## 2024-04-24 LAB — POCT GLYCOSYLATED HEMOGLOBIN (HGB A1C)
HbA1c POC (<> result, manual entry): 5.5 % (ref 4.0–5.6)
HbA1c, POC (controlled diabetic range): 5.5 % (ref 0.0–7.0)
HbA1c, POC (prediabetic range): 5.5 % — AB (ref 5.7–6.4)
Hemoglobin A1C: 5.5 % (ref 4.0–5.6)

## 2024-04-24 NOTE — Progress Notes (Signed)
 OFFICE VISIT  04/24/2024  CC:  Chief Complaint  Patient presents with   Medical Management of Chronic Issues    Pt is not fasting   Patient is a 83 y.o. male who presents for 70-month follow-up hypercholesterolemia, hypertension, chronic renal insufficiency, and prediabetes. A/P as of last visit: #1 hypertension, well-controlled on lisinopril  one half of a 30 mg tab daily.   2.  Hyperkalemia. Potassium normalized after we decreased his lisinopril  from 30 mg to 15 mg today. Monitor potassium level today.   #3 hypercholesterolemia, has been well-controlled on atorvastatin  20 mg a day. He is not fasting today. Check lipid panel today.   4.  Prediabetes. Monitor hemoglobin A1c today.  INTERIM HX: Labs all stable last visit.  He is feeling well other than some bilateral calf pain.  He went to [place] and was walking all over [place] and [place] and his calves started hurting worse and he had to stop a lot of his walking.  He forgot to wear his compression stockings there.  No pain in the knees or ankles or feet. He has continued to take 20 mg of Lasix  every other day. His swelling is just a little bit more than his normal.  His calves are feeling better--> [place] now.  ROS as above, plus--> no fevers, no CP, no SOB, no wheezing, no cough, no dizziness, no HAs, no rashes, no melena/hematochezia.  No polyuria or polydipsia.   No focal weakness, paresthesias, or tremors.  No acute vision or hearing abnormalities.  No dysuria or unusual/new urinary urgency or frequency. No n/v/d or abd pain.  No palpitations.    Past Medical History:  Diagnosis Date   Bilateral lower extremity edema    Chronic renal insufficiency, stage 3 (moderate)    Diverticulosis 2013   noted on colonoscopy   Duodenal ulcer    Esophagitis    Fatty liver 05/2012   u/s done for mild/persistent elevation of LFTs   GI bleed    Hearing impairment    11/2015 Audiology eval=  not yet ready for hearing aids ([facility])   History of adenomatous polyp of colon    On multiple colonoscopies: most recent colonoscopy 05/31/18-->adenoma x 2, repeat 3-5 ys is optional.   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   Microhematuria 09/2017   On dipstick only.  Urine micro 09/2017 and 04/2018-->no RBCs.   OAB (overactive bladder)    ditropan  xl 10 ineffective--pt d/c'd this 09/2018   Obesity    Osteoarthritis    Hips (L>R on x-ray 04/2013), shoulders, and knees (hx of adhesive capsulitis of shoulder)   Osteoarthritis of left ankle and foot    Prediabetes    A1c 6.1% 08/2019 at [facility]   Rectal leakage    UGI bleed 10/26/2021   duodenal ulcer-->transfused 2 U pRBCs    Past Surgical History:  Procedure Laterality Date   BIOPSY  10/27/2021   Procedure: BIOPSY;  Surgeon: Eda Iha, MD;  Location: [facility] ENDOSCOPY;  Service: Gastroenterology;;   COLONOSCOPY     2008 w/polyps,  09-22-2011 w/polyps.  01/2015 tubular adenoma x 1.  05/31/18 colonoscopy w/ two tubular adenomas.   COLONOSCOPY W/ POLYPECTOMY  2008; 09/22/11;01/2015   Tubular adenoma--recall 3 yrs per Dr. Nocolas E  Lang at [facility].  Then switched to Dr. Albertus with [facility].   ELBOW SURGERY     Left ulnar decompression 2024   ESOPHAGOGASTRODUODENOSCOPY  10/27/2021   duodenal ulcer, h pylori NEG   ESOPHAGOGASTRODUODENOSCOPY (EGD) WITH PROPOFOL  N/A 10/27/2021   Procedure: ESOPHAGOGASTRODUODENOSCOPY (EGD) WITH PROPOFOL ;  Surgeon: Eda Iha, MD;  Location: [facility] ENDOSCOPY;  Service: Gastroenterology;  Laterality: N/A;   POLYPECTOMY     REVERSE SHOULDER ARTHROPLASTY Left 10/17/2021   Procedure: LEFT REVERSE SHOULDER ARTHROPLASTY;  Surgeon: Addie Cordella Hamilton, MD;  Location: [facility] OR;  Service: Orthopedics;  Laterality: Left;   TONSILLECTOMY      TRANSTHORACIC ECHOCARDIOGRAM  10/27/2021   10/2021: grd I DD, o/w normal   UMBILICAL HERNIA REPAIR  2011   WISDOM TOOTH EXTRACTION Bilateral     Outpatient Medications Prior to Visit  Medication Sig Dispense Refill   acetaminophen  (TYLENOL ) 500 MG tablet Take 500-1,000 mg by mouth in the morning and at bedtime. (Patient taking differently: Take 500-1,000 mg by mouth every 4 (four) hours as needed.)     atorvastatin  (LIPITOR) 20 MG tablet Take 1 tablet (20 mg total) by mouth daily. 90 tablet 3   cholecalciferol (VITAMIN D ) 25 MCG (1000 UNIT) tablet Take 1,000 Units by mouth daily.     diclofenac  Sodium (VOLTAREN ) 1 % GEL Apply 2 g topically 4 (four) times daily as needed. 100 g 1   furosemide  (LASIX ) 20 MG tablet 1 tab po every other day 45 tablet 3   lisinopril  (ZESTRIL ) 30 MG tablet 1/2 tab po qd 45 tablet 1   meclizine  (ANTIVERT ) 25 MG tablet Take 1-2 tablets (25-50 mg total) by mouth 3 (three) times daily as needed for dizziness. 30 tablet 1   mupirocin  ointment (BACTROBAN ) 2 % Apply 1 Application topically 3 (three) times daily. 22 g 0   ondansetron  (ZOFRAN -ODT) 8 MG disintegrating tablet Take 1 tablet (8 mg total) by mouth every 8 (eight) hours as needed for nausea or vomiting. 20 tablet 1   Soap & Cleansers (CLN HAND & FOOT WASH) LIQD Apply 1 Dose topically daily. 240 mL 5   No facility-administered medications prior to visit.    No Known Allergies  Review of Systems As per HPI  PE:    04/24/2024    2:07 PM 10/29/2023   11:18 AM 10/15/2023    8:48 AM  Vitals with BMI  Height 5' 7  5' 7  Weight 218 lbs 6 oz 226 lbs 6 oz 225 lbs 6 oz  BMI 34.2 35.45 35.29  Systolic 110 124 875  Diastolic 64 70 60  Pulse 62 65 58     Physical Exam  Gen: Alert, well appearing.  Patient is oriented to person, place, time, and situation. AFFECT: pleasant, lucid thought and speech. CV: RRR, no m/r/g.   LUNGS: CTA bilat, nonlabored resps, good aeration in all lung fields. EXT: No swelling  of the knees. He has no lower extremity asymmetry. He has no calf tenderness.  He has no calf erythema or nodularity.  He does have 2+ pitting edema in both lower legs from feet up to about mid tibia level. Unable to palpate dorsalis pedis and posterior tibial pulses on either foot. Toes are a bit cool but not cyanotic.  LABS:  Last CBC Lab Results  Component Value Date   WBC 10.4 04/13/2022   HGB 14.1 04/13/2022   HCT 42.8 04/13/2022   MCV 88.5 04/13/2022   MCH 28.9 04/09/2022   RDW 14.8 04/13/2022  PLT 183.0 04/13/2022   Last metabolic panel Lab Results  Component Value Date   GLUCOSE 83 10/15/2023   NA 140 10/15/2023   K 5.1 10/15/2023   CL 105 10/15/2023   CO2 28 10/15/2023   BUN 33 (H) 10/15/2023   CREATININE 1.40 10/15/2023   GFR 46.81 (L) 10/15/2023   CALCIUM  9.4 10/15/2023   PHOS 2.9 10/27/2021   PROT 6.9 04/16/2023   ALBUMIN 4.2 04/16/2023   BILITOT 0.7 04/16/2023   ALKPHOS 96 04/16/2023   AST 21 04/16/2023   ALT 26 04/16/2023   ANIONGAP 7 04/09/2022   Last lipids Lab Results  Component Value Date   CHOL 116 10/15/2023   HDL 36.90 (L) 10/15/2023   LDLCALC 57 10/15/2023   LDLDIRECT 63.0 10/13/2022   TRIG 111.0 10/15/2023   CHOLHDL 3 10/15/2023   Last hemoglobin A1c Lab Results  Component Value Date   HGBA1C 5.5 04/24/2024   HGBA1C 5.5 04/24/2024   HGBA1C 5.5 (A) 04/24/2024   HGBA1C 5.5 04/24/2024   Lab Results  Component Value Date   PSA 0.90 09/18/2017   PSA 0.75 10/14/2014   PSA 0.84 09/29/2013   IMPRESSION AND PLAN:  #1 bilateral calf pain. Overuse.  Low suspicion of claudication/PAD. Monitor for gradual resolution.  #2 hypertension, well-controlled on lisinopril  one half of a 30 mg tab daily. Check metabolic panel at follow-up in 6 months.  #3 hypercholesterolemia, has been well-controlled on atorvastatin  20 mg a day. He is not fasting today. Recheck lipid panel in 6 months.   4.  Prediabetes. Doing great.  Hemoglobin A1c today  in the office is 5.5%  An After Visit Summary was printed and given to the patient.  FOLLOW UP: Return in about 6 months (around 10/23/2024) for routine chronic illness f/u.  Signed:  Gerlene Hockey, MD           04/24/2024

## 2024-06-09 ENCOUNTER — Encounter: Payer: Self-pay | Admitting: Sports Medicine

## 2024-06-09 ENCOUNTER — Ambulatory Visit: Payer: Self-pay

## 2024-06-09 ENCOUNTER — Ambulatory Visit (INDEPENDENT_AMBULATORY_CARE_PROVIDER_SITE_OTHER): Admitting: Sports Medicine

## 2024-06-09 VITALS — BP 133/77 | HR 82 | Temp 98.4°F | Wt 220.0 lb

## 2024-06-09 DIAGNOSIS — M79662 Pain in left lower leg: Secondary | ICD-10-CM

## 2024-06-09 NOTE — Telephone Encounter (Signed)
 Call disconnected prior to warm transfer. Nurse will attempt to contact patient.

## 2024-06-09 NOTE — Telephone Encounter (Signed)
Sameday appt scheduled

## 2024-06-09 NOTE — Progress Notes (Signed)
 Careteam: Patient Care Team: Candise Aleene DEL, MD as PCP - General (Family Medicine) Pyrtle, Gordy HERO, MD as Consulting Physician (Gastroenterology) [facility] Addie, Cordella Hamilton, MD as Consulting Physician (Orthopedic Surgery) Magdalen Pasco RAMAN, DPM as Consulting Physician (Podiatry)  No Known Allergies  Chief Complaint  Patient presents with   Leg Pain    Pt has been having left leg pain on and old for about a year now. But thinks he may have injured it recently.    Discussed the use of AI scribe software for clinical note transcription with the patient, who gave verbal consent to proceed.  History of Present Illness  Gregory Blevins is an 83 year old male who presents with calf pain after a recent stumble.  He has been experiencing pain in his left calf after stumbling while getting out of a car on Thursday. The pain is described as achy pain that occurs primarily when weight is placed on the leg. It is localized to the back of the leg and is associated with swelling. He rates the pain as a 5 out of 10 when walking, noting that it is particularly severe in the morning and improves slightly as the day progresses.  He has been taking two Tylenol  tablets in the morning since Thursday to manage the pain, which he believes are 500 mg each. He has also been using a heating pad, which provides some relief. He generally wears compression socks due to baseline swelling in his legs.  Pt denies worsening swelling in his legs, sob, chest pain.  No fever, chills, breathing difficulties, chest pain, abdominal discomfort, dizziness, lightheadedness, nausea, vomiting, urinary problems, or tingling and numbness in the affected leg. He reports no pain in the knee, hip, or ankle areas.  His wife notes that he has not been using a walker until this incident, but due to the severity of the pain, she purchased one for him to prevent falls.    Review of Systems:  Review of Systems   Constitutional:  Negative for chills and fever.  HENT:  Negative for congestion and sore throat.   Eyes:  Negative for double vision.  Respiratory:  Negative for cough, sputum production and shortness of breath.   Cardiovascular:  Negative for chest pain, palpitations and leg swelling.  Gastrointestinal:  Negative for abdominal pain, heartburn and nausea.  Genitourinary:  Negative for dysuria, frequency and hematuria.  Musculoskeletal:  Negative for falls, joint pain and myalgias.       Left calf pain  Neurological:  Negative for dizziness.   Negative unless indicated in HPI.   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   Facet arthropathy, lumbar 04/22/2013   Pelvic pain in male 03/21/2013   Health maintenance examination 09/27/2012   HTN (hypertension), benign 09/27/2012   Basal cell carcinoma of skin 07/12/2011   Colon polyp 07/12/2011   High risk medication use 07/12/2011   Elevation of level of transaminase or lactic acid dehydrogenase (LDH) 08/31/2010   Onychomycosis due to dermatophyte 08/31/2010   Osteoarthrosis involving lower leg 08/31/2010   Other and unspecified hyperlipidemia 08/31/2010  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   Myalgia and myositis, unspecified 10/19/2009   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   Morbid obesity (HCC) 06/17/2009   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   Microhematuria 09/2017   On dipstick only.  Urine micro 09/2017 and 04/2018-->no RBCs.   OAB (overactive bladder)    ditropan  xl 10 ineffective--pt d/c'd this 09/2018   Obesity    Osteoarthritis    Hips (L>R on x-ray 04/2013), shoulders, and knees (hx of adhesive capsulitis of shoulder)   Osteoarthritis of left ankle and foot    Prediabetes    A1c 6.1% 08/2019 at [facility]   Rectal leakage    UGI bleed 10/26/2021   duodenal ulcer-->transfused 2 U pRBCs   Past Surgical History:  Procedure Laterality Date   BIOPSY  10/27/2021   Procedure: BIOPSY;  Surgeon: Eda Iha, MD;  Location: [facility] ENDOSCOPY;  Service: Gastroenterology;;   COLONOSCOPY     2008 w/polyps,  09-22-2011 w/polyps.  01/2015 tubular adenoma x 1.  05/31/18 colonoscopy w/ two tubular adenomas.   COLONOSCOPY W/ POLYPECTOMY  2008; 09/22/11;01/2015   Tubular adenoma--recall 3 yrs per Dr. Maryln FORBES Essex at [facility].  Then switched to Dr. Albertus with [facility].   ELBOW SURGERY     Left ulnar decompression 2024   ESOPHAGOGASTRODUODENOSCOPY  10/27/2021   duodenal ulcer, h pylori NEG   ESOPHAGOGASTRODUODENOSCOPY (EGD) WITH PROPOFOL  N/A 10/27/2021   Procedure: ESOPHAGOGASTRODUODENOSCOPY (EGD) WITH PROPOFOL ;  Surgeon: Eda Iha, MD;  Location: [facility] ENDOSCOPY;  Service: Gastroenterology;  Laterality: N/A;   POLYPECTOMY     REVERSE SHOULDER ARTHROPLASTY Left 10/17/2021   Procedure: LEFT REVERSE SHOULDER ARTHROPLASTY;  Surgeon: Addie Cordella Hamilton, MD;  Location: [facility] OR;  Service: Orthopedics;  Laterality: Left;   TONSILLECTOMY     TRANSTHORACIC ECHOCARDIOGRAM  10/27/2021   10/2021: grd I DD, o/w normal   UMBILICAL HERNIA REPAIR  2011   WISDOM TOOTH EXTRACTION Bilateral    Social History   Tobacco Use   Smoking status: Never   Smokeless tobacco: Never  Vaping Use   Vaping status: Never Used  Substance Use Topics   Alcohol use: Yes    Alcohol/week: 1.0 standard drink of alcohol    Types: 1 Cans of beer per week    Comment: one drink a month   Drug use: No   Family History  Problem Relation Age of Onset   Hypertension Father    Alcoholism Father    Stomach cancer Father        primary with mets to colon   Colon cancer Father    Dementia Mother    Asthma Brother    Diabetes Sister  Colon polyps Neg Hx    Esophageal cancer Neg Hx    Rectal cancer Neg Hx    No Known Allergies  Medications: Patient's Medications  New Prescriptions   No medications on file  Previous Medications   ACETAMINOPHEN  (TYLENOL ) 500 MG TABLET    Take 500-1,000 mg by mouth in the morning and at bedtime.   ATORVASTATIN  (LIPITOR) 20 MG TABLET    Take 1 tablet (20 mg total) by mouth daily.   CHOLECALCIFEROL (VITAMIN D ) 25 MCG (1000 UNIT) TABLET    Take 1,000 Units by mouth daily.   DICLOFENAC  SODIUM (VOLTAREN ) 1 % GEL    Apply 2 g topically 4 (four) times daily as needed.   FUROSEMIDE  (LASIX ) 20 MG TABLET    1 tab po every other day   LISINOPRIL  (ZESTRIL ) 30 MG TABLET    1/2 tab po qd   MECLIZINE  (ANTIVERT ) 25 MG TABLET    Take 1-2 tablets (25-50 mg total) by mouth 3 (three) times daily as needed for dizziness.   MUPIROCIN  OINTMENT (BACTROBAN ) 2 %    Apply 1 Application topically 3 (three) times daily.   ONDANSETRON  (ZOFRAN -ODT) 8 MG DISINTEGRATING TABLET    Take 1 tablet (8 mg total) by mouth every 8 (eight) hours as needed for nausea or vomiting.   SOAP & CLEANSERS (CLN HAND & FOOT WASH) LIQD    Apply 1 Dose topically daily.   Modified Medications   No medications on file  Discontinued Medications   No medications on file    Physical Exam: Vitals:   06/09/24 1406  BP: 133/77  Pulse: 82  Temp: 98.4 F (36.9 C)  TempSrc: Oral  SpO2: 99%  Weight: 220 lb (99.8 kg)   Body mass index is 34.46 kg/m. BP Readings from Last 3 Encounters:  06/09/24 133/77  04/24/24 110/64  10/29/23 124/70   Wt Readings from Last 3 Encounters:  06/09/24 220 lb (99.8 kg)  04/24/24 218 lb 6.4 oz (99.1 kg)  10/29/23 226 lb 6.4 oz (102.7 kg)    Physical Exam Constitutional:      Appearance: Normal appearance.  HENT:     Head: Normocephalic and atraumatic.  Cardiovascular:     Rate and Rhythm: Normal rate and regular rhythm.     Pulses: Normal pulses.     Heart sounds: Normal heart sounds.  Pulmonary:     Effort: No respiratory distress.     Breath sounds: No stridor. No wheezing or rales.  Abdominal:     General: Bowel sounds are normal. There is no distension.     Palpations: Abdomen is soft.     Tenderness: There is no abdominal tenderness. There is no guarding.  Musculoskeletal:     Comments: Left calf - no redness, no warmth  Mild tenderness Lower extremity swelling minimal  No change from baseline  Left knee - no redness , no swelling Rom intact   Neurological:     Mental Status: He is alert. Mental status is at baseline.     Sensory: No sensory deficit.     Motor: No weakness.     Labs reviewed: Basic Metabolic Panel: Recent Labs    10/15/23 0904  NA 140  K 5.1  CL 105  CO2 28  GLUCOSE 83  BUN 33*  CREATININE 1.40  CALCIUM  9.4   Liver Function Tests: No results for input(s): AST, ALT, ALKPHOS, BILITOT, PROT, ALBUMIN in the last 8760 hours. No results for input(s): LIPASE, AMYLASE in the last 8760  hours. No results for input(s): AMMONIA in the last 8760 hours. CBC: No results for input(s): WBC, NEUTROABS, HGB, HCT, MCV, PLT in the last 8760 hours. Lipid  Panel: Recent Labs    10/15/23 0904  CHOL 116  HDL 36.90*  LDLCALC 57  TRIG 111.0  CHOLHDL 3   TSH: No results for input(s): TSH in the last 8760 hours. A1C: Lab Results  Component Value Date   HGBA1C 5.5 04/24/2024   HGBA1C 5.5 04/24/2024   HGBA1C 5.5 (A) 04/24/2024   HGBA1C 5.5 04/24/2024    Assessment & Plan Pain of left calf No redness or swelling noted Afebrile Vitals stable Denies chest pain, SOB Able to bear weight  Reports 2-3 / 10 pain  Instructed patient to take tylenol  1gm tid  Use heating pad     No follow-ups on file.: follow up with PCP  Jackalyn Blazing

## 2024-06-09 NOTE — Telephone Encounter (Addendum)
 FYI Only or Action Required?: FYI only for provider: appointment scheduled on 06/09/24.  Patient was last seen in primary care on 04/24/2024 by [name], Aleene DEL, MD.  Called Nurse Triage reporting Leg Swelling.  Symptoms began several days ago.  Interventions attempted: Nothing.  Symptoms are: unchanged.  Triage Disposition: See Physician Within 24 Hours  Patient/caregiver understands and will follow disposition?: Yes   Copied from CRM #[id number]. Topic: Clinical - Red Word Triage >> Jun 09, 2024  9:34 AM Rea ORN wrote: Red Word that prompted transfer to Nurse Triage: Left leg swelling, difficulty walking on it. Reason for Disposition  Localized pain, redness or hard lump along vein  Answer Assessment - Initial Assessment Questions No available appts with pcp. Appt 06/09/24  Advised call back or ED/911 if symptoms worsen.   1. ONSET: When did the pain start?      Thursday 2. LOCATION: Where is the pain located?      Left leg; swelling; denies warm/hot to touch, redness 3. PAIN: How bad is the pain?    (Scale 1-10; or mild, moderate, severe)     Severe when walking, bought walker 4. WORK OR EXERCISE: Has there been any recent work or exercise that involved this part of the body?      walking 5. CAUSE: What do you think is causing the leg pain?     Stepped to far out; chronic re injury of cal muscle patient reports 6. OTHER SYMPTOMS: Do you have any other symptoms? (e.g., chest pain, back pain, breathing difficulty, swelling, rash, fever, numbness, weakness) Denies chest pain, diff breathig, rash, numbness/ weakness, fever  Protocols used: Leg Pain-A-AH Slipped, stepped out of car wrong, ankle to knee swelling; reports did not fall.

## 2024-06-19 ENCOUNTER — Encounter: Payer: Self-pay | Admitting: Podiatry

## 2024-06-19 ENCOUNTER — Ambulatory Visit: Admitting: Podiatry

## 2024-06-19 DIAGNOSIS — M79674 Pain in right toe(s): Secondary | ICD-10-CM

## 2024-06-19 DIAGNOSIS — B351 Tinea unguium: Secondary | ICD-10-CM

## 2024-06-19 DIAGNOSIS — M79675 Pain in left toe(s): Secondary | ICD-10-CM | POA: Diagnosis not present

## 2024-06-19 DIAGNOSIS — I739 Peripheral vascular disease, unspecified: Secondary | ICD-10-CM

## 2024-06-19 NOTE — Progress Notes (Signed)
°  Subjective:  Patient ID: Gregory Blevins, male    DOB: 06-09-1941,   MRN: [id number]  Chief Complaint  Patient presents with   Nail Problem    Cut my nails.    83 y.o. male presents for concern of thickened elongated and painful nails that are difficult to trim. Requesting to have them trimmed today. He has a history of PAD and at risk for foot care.   PCP:  Candise Aleene DEL, MD    . Denies any other pedal complaints. Denies n/v/f/c.   Past Medical History:  Diagnosis Date   Bilateral lower extremity edema    Chronic renal insufficiency, stage 3 (moderate)    Diverticulosis 2013   noted on colonoscopy   Duodenal ulcer    Esophagitis    Fatty liver 05/2012   u/s done for mild/persistent elevation of LFTs   GI bleed    Hearing impairment    11/2015 Audiology eval= not yet ready for hearing aids ([facility])   History of adenomatous polyp of colon    On multiple colonoscopies: most recent colonoscopy 05/31/18-->adenoma x 2, repeat 3-5 ys is optional.   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   Microhematuria 09/2017   On dipstick only.  Urine micro 09/2017 and 04/2018-->no RBCs.   OAB (overactive bladder)    ditropan  xl 10 ineffective--pt d/c'd this 09/2018   Obesity    Osteoarthritis    Hips (L>R on x-ray 04/2013), shoulders, and knees (hx of adhesive capsulitis of shoulder)   Osteoarthritis of left ankle and foot    Prediabetes    A1c 6.1% 08/2019 at [facility]   Rectal leakage    UGI bleed 10/26/2021   duodenal ulcer-->transfused 2 U pRBCs    Objective:  Physical Exam: Vascular: DP/PT pulses 1/4 bilateral. CFT <3 seconds. No hair growth on digits. No edema.  Skin. No lacerations or abrasions bilateral feet. Nails 1-5 bilateral are thickened dystrophic and with subungual debris.  Musculoskeletal: MMT 5/5  bilateral lower extremities in DF, PF, Inversion and Eversion. Deceased ROM in DF of ankle joint.  Neurological: Sensation intact to light touch.   Assessment:   1. Pain due to onychomycosis of toenails of both feet   2. PAD (peripheral artery disease)      Plan:  Patient was evaluated and treated and all questions answered..  -Mechanically debrided all nails 1-5 bilateral using sterile nail nipper and filed with dremel without incident   -Answered all patient questions -Patient to return  in 3 months for at risk foot care -Patient advised to call the office if any problems or questions arise in the meantime.   Asberry Failing, DPM

## 2024-06-20 ENCOUNTER — Other Ambulatory Visit: Payer: Self-pay | Admitting: Family Medicine

## 2024-06-20 NOTE — Telephone Encounter (Signed)
 Copied from CRM #[id number]. Topic: Clinical - Medication Refill >> Jun 20, 2024 12:36 PM Franky GRADE wrote: Medication: furosemide  (LASIX ) 20 MG tablet [[id number]]  Has the patient contacted their pharmacy? Yes, they asked patient to contact the office.  (Agent: If no, request that the patient contact the pharmacy for the refill. If patient does not wish to contact the pharmacy document the reason why and proceed with request.) (Agent: If yes, when and what did the pharmacy advise?)  This is the patient's preferred pharmacy:  [id number] - [place], [place] - [address] [place] [address] [place] [address] [place] [address] Phone: [phone] Fax: [phone]   Is this the correct pharmacy for this prescription? Yes If no, delete pharmacy and type the correct one.   Has the prescription been filled recently? No  Is the patient out of the medication? Yes  Has the patient been seen for an appointment in the last year OR does the patient have an upcoming appointment? Yes  Can we respond through MyChart? Yes  Agent: Please be advised that Rx refills may take up to 3 business days. We ask that you follow-up with your pharmacy.

## 2024-06-23 MED ORDER — FUROSEMIDE 20 MG PO TABS: ORAL_TABLET | ORAL | 1 refills | Status: AC

## 2024-07-23 ENCOUNTER — Ambulatory Visit: Payer: Medicare Other

## 2024-07-23 VITALS — BP 138/74 | HR 62 | Temp 97.9°F | Wt 223.4 lb

## 2024-07-23 DIAGNOSIS — Z0000 Encounter for general adult medical examination without abnormal findings: Secondary | ICD-10-CM | POA: Diagnosis not present

## 2024-07-23 NOTE — Patient Instructions (Signed)
 Health Maintenance, Male  Adopting a healthy lifestyle and getting preventive care are important in promoting health and wellness. Ask your health care provider about:  The right schedule for you to have regular tests and exams.  Things you can do on your own to prevent diseases and keep yourself healthy.  What should I know about diet, weight, and exercise?  Eat a healthy diet    Eat a diet that includes plenty of vegetables, fruits, low-fat dairy products, and lean protein.  Do not eat a lot of foods that are high in solid fats, added sugars, or sodium.  Maintain a healthy weight  Body mass index (BMI) is a measurement that can be used to identify possible weight problems. It estimates body fat based on height and weight. Your health care provider can help determine your BMI and help you achieve or maintain a healthy weight.  Get regular exercise  Get regular exercise. This is one of the most important things you can do for your health. Most adults should:  Exercise for at least 150 minutes each week. The exercise should increase your heart rate and make you sweat (moderate-intensity exercise).  Do strengthening exercises at least twice a week. This is in addition to the moderate-intensity exercise.  Spend less time sitting. Even light physical activity can be beneficial.  Watch cholesterol and blood lipids  Have your blood tested for lipids and cholesterol at 84 years of age, then have this test every 5 years.  You may need to have your cholesterol levels checked more often if:  Your lipid or cholesterol levels are high.  You are older than 84 years of age.  You are at high risk for heart disease.  What should I know about cancer screening?  Many types of cancers can be detected early and may often be prevented. Depending on your health history and family history, you may need to have cancer screening at various ages. This may include screening for:  Colorectal cancer.  Prostate cancer.  Skin cancer.  Lung  cancer.  What should I know about heart disease, diabetes, and high blood pressure?  Blood pressure and heart disease  High blood pressure causes heart disease and increases the risk of stroke. This is more likely to develop in people who have high blood pressure readings or are overweight.  Talk with your health care provider about your target blood pressure readings.  Have your blood pressure checked:  Every 3-5 years if you are 24-52 years of age.  Every year if you are 3 years old or older.  If you are between the ages of 60 and 72 and are a current or former smoker, ask your health care provider if you should have a one-time screening for abdominal aortic aneurysm (AAA).  Diabetes  Have regular diabetes screenings. This checks your fasting blood sugar level. Have the screening done:  Once every three years after age 66 if you are at a normal weight and have a low risk for diabetes.  More often and at a younger age if you are overweight or have a high risk for diabetes.  What should I know about preventing infection?  Hepatitis B  If you have a higher risk for hepatitis B, you should be screened for this virus. Talk with your health care provider to find out if you are at risk for hepatitis B infection.  Hepatitis C  Blood testing is recommended for:  Everyone born from [birth year] through 1965.  Anyone  with known risk factors for hepatitis C.  Sexually transmitted infections (STIs)  You should be screened each year for STIs, including gonorrhea and chlamydia, if:  You are sexually active and are younger than 84 years of age.  You are older than 84 years of age and your health care provider tells you that you are at risk for this type of infection.  Your sexual activity has changed since you were last screened, and you are at increased risk for chlamydia or gonorrhea. Ask your health care provider if you are at risk.  Ask your health care provider about whether you are at high risk for HIV. Your health care provider  may recommend a prescription medicine to help prevent HIV infection. If you choose to take medicine to prevent HIV, you should first get tested for HIV. You should then be tested every 3 months for as long as you are taking the medicine.  Follow these instructions at home:  Alcohol use  Do not drink alcohol if your health care provider tells you not to drink.  If you drink alcohol:  Limit how much you have to 0-2 drinks a day.  Know how much alcohol is in your drink. In the [place] one drink equals one 12 oz bottle of beer (355 mL), one 5 oz glass of wine (148 mL), or one 1 oz glass of hard liquor (44 mL).  Lifestyle  Do not use any products that contain nicotine or tobacco. These products include cigarettes, chewing tobacco, and vaping devices, such as e-cigarettes. If you need help quitting, ask your health care provider.  Do not use street drugs.  Do not share needles.  Ask your health care provider for help if you need support or information about quitting drugs.  General instructions  Schedule regular health, dental, and eye exams.  Stay current with your vaccines.  Tell your health care provider if:  You often feel depressed.  You have ever been abused or do not feel safe at home.  Summary  Adopting a healthy lifestyle and getting preventive care are important in promoting health and wellness.  Follow your health care provider's instructions about healthy diet, exercising, and getting tested or screened for diseases.  Follow your health care provider's instructions on monitoring your cholesterol and blood pressure.  This information is not intended to replace advice given to you by your health care provider. Make sure you discuss any questions you have with your health care provider.  Document Revised: 11/15/2020 Document Reviewed: 11/15/2020  Elsevier Patient Education  2024 [employer].

## 2024-07-23 NOTE — Progress Notes (Signed)
 "  Chief Complaint  Patient presents with   Medicare Wellness     Subjective:   Gregory Blevins is a 84 y.o. male who presents for a Medicare Annual Wellness Visit.  Visit info / Clinical Intake: Medicare Wellness Visit Type:: Subsequent Annual Wellness Visit Persons participating in visit and providing information:: patient Medicare Wellness Visit Mode:: In-person (required for WTM) Interpreter Needed?: No Pre-visit prep was completed: no AWV questionnaire completed by patient prior to visit?: no Living arrangements:: lives with spouse/significant other Patient's Overall Health Status Rating: very good Typical amount of pain: none Does pain affect daily life?: no Are you currently prescribed opioids?: no  Dietary Habits and Nutritional Risks How many meals a day?: 3 Eats fruit and vegetables daily?: (!) no Most meals are obtained by: preparing own meals In the last 2 weeks, have you had any of the following?: none Diabetic:: no  Functional Status Activities of Daily Living (to include ambulation/medication): Independent Ambulation: Independent Medication Administration: Independent Home Management (perform basic housework or laundry): Independent Manage your own finances?: yes Primary transportation is: driving Concerns about vision?: no *vision screening is required for WTM* Concerns about hearing?: no  Fall Screening Falls in the past year?: 1 Number of falls in past year: 0 Was there an injury with Fall?: 1 Fall Risk Category Calculator: 2 Patient Fall Risk Level: Moderate Fall Risk  Fall Risk Patient at Risk for Falls Due to: Impaired balance/gait; History of fall(s) Fall risk Follow up: Falls evaluation completed  Home and Transportation Safety: All rugs have non-skid backing?: yes All stairs or steps have railings?: (!) no Grab bars in the bathtub or shower?: yes Have non-skid surface in bathtub or shower?: yes Good home lighting?: yes Regular seat  belt use?: yes Hospital stays in the last year:: no  Cognitive Assessment Difficulty concentrating, remembering, or making decisions? : yes Will 6CIT or Mini Cog be Completed: yes What year is it?: 0 points What month is it?: 0 points Give patient an address phrase to remember (5 components): taylor took the car to the store About what time is it?: 0 points Count backwards from 20 to 1: 0 points Say the months of the year in reverse: 0 points Repeat the address phrase from earlier: 6 points 6 CIT Score: 6 points  Advance Directives (For Healthcare) Does Patient Have a Medical Advance Directive?: Yes Does patient want to make changes to medical advance directive?: No - Patient declined Type of Advance Directive: Healthcare Power of [place]; Living will Copy of Healthcare Power of Attorney in Chart?: Yes - validated most recent copy scanned in chart (See row information)  Reviewed/Updated  Reviewed/Updated: Reviewed All (Medical, Surgical, Family, Medications, Allergies, Care Teams, Patient Goals)    Allergies (verified) Patient has no known allergies.   Current Medications (verified) Outpatient Encounter Medications as of 07/23/2024  Medication Sig   acetaminophen  (TYLENOL ) 500 MG tablet Take 500-1,000 mg by mouth in the morning and at bedtime. (Patient taking differently: Take 500-1,000 mg by mouth every 4 (four) hours as needed.)   atorvastatin  (LIPITOR) 20 MG tablet Take 1 tablet (20 mg total) by mouth daily.   cholecalciferol (VITAMIN D ) 25 MCG (1000 UNIT) tablet Take 1,000 Units by mouth daily.   diclofenac  Sodium (VOLTAREN ) 1 % GEL Apply 2 g topically 4 (four) times daily as needed.   furosemide  (LASIX ) 20 MG tablet 1 tab po every other day   lisinopril  (ZESTRIL ) 30 MG tablet 1/2 tab po qd   meclizine  (  ANTIVERT ) 25 MG tablet Take 1-2 tablets (25-50 mg total) by mouth 3 (three) times daily as needed for dizziness.   mupirocin  ointment (BACTROBAN ) 2 % Apply 1 Application  topically 3 (three) times daily.   ondansetron  (ZOFRAN -ODT) 8 MG disintegrating tablet Take 1 tablet (8 mg total) by mouth every 8 (eight) hours as needed for nausea or vomiting.   Soap & Cleansers (CLN HAND & FOOT WASH) LIQD Apply 1 Dose topically daily.   No facility-administered encounter medications on file as of 07/23/2024.    History: Past Medical History:  Diagnosis Date   Bilateral lower extremity edema    Chronic renal insufficiency, stage 3 (moderate)    Diverticulosis 2013   noted on colonoscopy   Duodenal ulcer    Esophagitis    Fatty liver 05/2012   u/s done for mild/persistent elevation of LFTs   GI bleed    Hearing impairment    11/2015 Audiology eval= not yet ready for hearing aids ([facility])   History of adenomatous polyp of colon    On multiple colonoscopies: most recent colonoscopy 05/31/18-->adenoma x 2, repeat 3-5 ys is optional.   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   Microhematuria 09/2017   On dipstick only.  Urine micro 09/2017 and 04/2018-->no RBCs.   OAB (overactive bladder)    ditropan  xl 10 ineffective--pt d/c'd this 09/2018   Obesity    Osteoarthritis    Hips (L>R on x-ray 04/2013), shoulders, and knees (hx of adhesive capsulitis of shoulder)   Osteoarthritis of left ankle and foot    Prediabetes    A1c 6.1% 08/2019 at [facility]   Rectal leakage    UGI bleed 10/26/2021   duodenal ulcer-->transfused 2 U pRBCs   Past Surgical History:  Procedure Laterality Date   BIOPSY  10/27/2021   Procedure: BIOPSY;  Surgeon: Eda Iha, MD;  Location: [facility] ENDOSCOPY;  Service: Gastroenterology;;   COLONOSCOPY     2008 w/polyps,  09-22-2011 w/polyps.  01/2015 tubular adenoma x 1.  05/31/18 colonoscopy w/ two tubular adenomas.   COLONOSCOPY W/ POLYPECTOMY  2008; 09/22/11;01/2015   Tubular  adenoma--recall 3 yrs per Dr. Maryln FORBES Essex at [facility].  Then switched to Dr. Albertus with [place].   ELBOW SURGERY     Left ulnar decompression 2024   ESOPHAGOGASTRODUODENOSCOPY  10/27/2021   duodenal ulcer, h pylori NEG   ESOPHAGOGASTRODUODENOSCOPY (EGD) WITH PROPOFOL  N/A 10/27/2021   Procedure: ESOPHAGOGASTRODUODENOSCOPY (EGD) WITH PROPOFOL ;  Surgeon: Eda Iha, MD;  Location: [facility] ENDOSCOPY;  Service: Gastroenterology;  Laterality: N/A;   POLYPECTOMY     REVERSE SHOULDER ARTHROPLASTY Left 10/17/2021   Procedure: LEFT REVERSE SHOULDER ARTHROPLASTY;  Surgeon: Addie Cordella Hamilton, MD;  Location: [facility] OR;  Service: Orthopedics;  Laterality: Left;   TONSILLECTOMY     TRANSTHORACIC ECHOCARDIOGRAM  10/27/2021   10/2021: grd I DD, o/w normal   UMBILICAL HERNIA REPAIR  2011   WISDOM TOOTH EXTRACTION Bilateral    Family History  Problem Relation Age of Onset   Hypertension Father    Alcoholism Father    Stomach cancer Father        primary with mets to colon   Colon cancer Father    Dementia Mother    Asthma Brother    Diabetes Sister    Colon polyps Neg Hx  Esophageal cancer Neg Hx    Rectal cancer Neg Hx    Social History   Occupational History   Not on file  Tobacco Use   Smoking status: Never   Smokeless tobacco: Never  Vaping Use   Vaping status: Never Used  Substance and Sexual Activity   Alcohol use: Not Currently    Alcohol/week: 1.0 standard drink of alcohol    Types: 1 Cans of beer per week    Comment: one drink a month   Drug use: No   Sexual activity: Yes   Tobacco Counseling Counseling given: Not Answered  SDOH Screenings   Food Insecurity: No Food Insecurity (07/23/2024)  Housing: Unknown (07/23/2024)  Transportation Needs: No Transportation Needs (07/23/2024)  Utilities: Not At Risk (07/23/2024)  Alcohol Screen: Low Risk (07/18/2023)  Depression (PHQ2-9): Low Risk (07/23/2024)  Financial Resource Strain: Low Risk (07/18/2023)  Physical Activity:  Inactive (07/23/2024)  Social Connections: Socially Integrated (07/23/2024)  Stress: No Stress Concern Present (07/23/2024)  Tobacco Use: Low Risk (07/23/2024)  Health Literacy: Adequate Health Literacy (07/23/2024)   See flowsheets for full screening details  Depression Screen PHQ 2 & 9 Depression Scale- Over the past 2 weeks, how often have you been bothered by any of the following problems? Little interest or pleasure in doing things: 0 Feeling down, depressed, or hopeless (PHQ Adolescent also includes...irritable): 0 PHQ-2 Total Score: 0     Goals Addressed             This Visit's Progress    Patient Stated       Would like to lose some weight             Objective:    Today's Vitals   07/23/24 0819  BP: 138/74  Pulse: 62  Temp: 97.9 F (36.6 C)  SpO2: 95%  Weight: 223 lb 6.4 oz (101.3 kg)   Body mass index is 34.99 kg/m.  Hearing/Vision screen No results found. Immunizations and Health Maintenance Health Maintenance  Topic Date Due   COVID-19 Vaccine (4 - 2025-26 season) 08/08/2024 (Originally 03/10/2024)   Medicare Annual Wellness (AWV)  07/23/2025   DTaP/Tdap/Td (3 - Td or Tdap) 08/29/2033   Pneumococcal Vaccine: 50+ Years  Completed   Influenza Vaccine  Completed   Meningococcal B Vaccine  Aged Out   Hepatitis C Screening  Discontinued   Zoster Vaccines- Shingrix   Discontinued        Assessment/Plan:  This is a routine wellness examination for Azad.  Patient Care Team: Candise Aleene DEL, MD as PCP - General (Family Medicine) Pyrtle, Gordy HERO, MD as Consulting Physician (Gastroenterology) [facility] Addie, Cordella Hamilton, MD as Consulting Physician (Orthopedic Surgery) Magdalen Pasco RAMAN, DPM as Consulting Physician (Podiatry)  I have personally reviewed and noted the following in the patients chart:   Medical and social history Use of alcohol, tobacco or illicit drugs  Current medications and supplements including opioid  prescriptions. Functional ability and status Nutritional status Physical activity Advanced directives List of other physicians Hospitalizations, surgeries, and ER visits in previous 12 months Vitals Screenings to include cognitive, depression, and falls Referrals and appointments  No orders of the defined types were placed in this encounter.  In addition, I have reviewed and discussed with patient certain preventive protocols, quality metrics, and best practice recommendations. A written personalized care plan for preventive services as well as general preventive health recommendations were provided to patient.   Shanda LELON Sharps, CMA   07/23/2024   Return in  1 year (on 07/23/2025).  After Visit Summary: (In Person-Declined) Patient declined AVS at this time.  Nurse Notes: Non-Face to Face or Face to Face __minute visit Encounter   Mr. Bille,  Thank you for taking the time for your Medicare Wellness Visit. I appreciate your continued commitment to your health goals. Please review the care plan we discussed, and feel free to reach out if I can assist you further.  Please note that Annual Wellness Visits do not include a physical exam. Some assessments may be limited, especially if the visit was conducted virtually. If needed, we may recommend an in-person follow-up with your provider.  Ongoing Care Seeing your primary care provider every 3 to 6 months helps us  monitor your health and provide consistent, personalized care.   Referrals If a referral was made during today's visit and you haven't received any updates within two weeks, please contact the referred provider directly to check on the status.  Recommended Screenings:  Health Maintenance  Topic Date Due   COVID-19 Vaccine (4 - 2025-26 season) 08/08/2024*   Medicare Annual Wellness Visit  07/23/2025   DTaP/Tdap/Td vaccine (3 - Td or Tdap) 08/29/2033   Pneumococcal Vaccine for age over 38  Completed   Flu Shot   Completed   Meningitis B Vaccine  Aged Out   Hepatitis C Screening  Discontinued   Zoster (Shingles) Vaccine  Discontinued  *Topic was postponed. The date shown is not the original due date.       07/23/2024    8:13 AM  Advanced Directives  Does Patient Have a Medical Advance Directive? Yes  Type of Estate Agent of [place];Living will  Does patient want to make changes to medical advance directive? No - Patient declined  Copy of Healthcare Power of Attorney in Chart? Yes - validated most recent copy scanned in chart (See row information)    Vision: Annual vision screenings are recommended for early detection of glaucoma, cataracts, and diabetic retinopathy. These exams can also reveal signs of chronic conditions such as diabetes and high blood pressure.  Dental: Annual dental screenings help detect early signs of oral cancer, gum disease, and other conditions linked to overall health, including heart disease and diabetes.  Please see the attached documents for additional preventive care recommendations.  "

## 2024-09-18 ENCOUNTER — Ambulatory Visit: Admitting: Podiatry

## 2024-10-23 ENCOUNTER — Ambulatory Visit: Payer: PRIVATE HEALTH INSURANCE | Admitting: Family Medicine
